# Patient Record
Sex: Female | Born: 1935 | Race: Black or African American | Hispanic: No | Smoking: Former smoker
Health system: Southern US, Community
[De-identification: ages and names within clinical notes are randomized; demographics above are authoritative.]

## PROBLEM LIST (undated history)

## (undated) DIAGNOSIS — J45909 Unspecified asthma, uncomplicated: Secondary | ICD-10-CM

## (undated) DIAGNOSIS — C801 Malignant (primary) neoplasm, unspecified: Secondary | ICD-10-CM

## (undated) DIAGNOSIS — I509 Heart failure, unspecified: Secondary | ICD-10-CM

## (undated) DIAGNOSIS — G459 Transient cerebral ischemic attack, unspecified: Secondary | ICD-10-CM

## (undated) DIAGNOSIS — M1990 Unspecified osteoarthritis, unspecified site: Secondary | ICD-10-CM

## (undated) DIAGNOSIS — I10 Essential (primary) hypertension: Secondary | ICD-10-CM

## (undated) HISTORY — PX: MASTECTOMY: SHX3

## (undated) HISTORY — PX: ABDOMINAL HYSTERECTOMY: SHX81

---

## 2005-12-06 ENCOUNTER — Ambulatory Visit (HOSPITAL_COMMUNITY): Admission: RE | Admit: 2005-12-06 | Discharge: 2005-12-06 | Payer: Self-pay | Admitting: *Deleted

## 2005-12-31 ENCOUNTER — Emergency Department (HOSPITAL_COMMUNITY): Admission: EM | Admit: 2005-12-31 | Discharge: 2005-12-31 | Payer: Self-pay | Admitting: Emergency Medicine

## 2006-01-25 ENCOUNTER — Emergency Department (HOSPITAL_COMMUNITY): Admission: EM | Admit: 2006-01-25 | Discharge: 2006-01-26 | Payer: Self-pay | Admitting: Emergency Medicine

## 2006-02-12 ENCOUNTER — Emergency Department (HOSPITAL_COMMUNITY): Admission: EM | Admit: 2006-02-12 | Discharge: 2006-02-12 | Payer: Self-pay | Admitting: Emergency Medicine

## 2006-02-14 ENCOUNTER — Inpatient Hospital Stay (HOSPITAL_COMMUNITY): Admission: AD | Admit: 2006-02-14 | Discharge: 2006-02-20 | Payer: Self-pay | Admitting: Psychiatry

## 2006-02-14 ENCOUNTER — Ambulatory Visit: Payer: Self-pay | Admitting: Psychiatry

## 2006-05-28 ENCOUNTER — Emergency Department (HOSPITAL_COMMUNITY): Admission: EM | Admit: 2006-05-28 | Discharge: 2006-05-28 | Payer: Self-pay | Admitting: Emergency Medicine

## 2006-12-09 ENCOUNTER — Ambulatory Visit (HOSPITAL_COMMUNITY): Admission: RE | Admit: 2006-12-09 | Discharge: 2006-12-09 | Payer: Self-pay | Admitting: *Deleted

## 2007-03-04 ENCOUNTER — Encounter: Admission: RE | Admit: 2007-03-04 | Discharge: 2007-05-07 | Payer: Self-pay | Admitting: Orthopaedic Surgery

## 2007-07-24 ENCOUNTER — Emergency Department (HOSPITAL_COMMUNITY): Admission: EM | Admit: 2007-07-24 | Discharge: 2007-07-24 | Payer: Self-pay | Admitting: Emergency Medicine

## 2007-12-02 ENCOUNTER — Ambulatory Visit: Payer: Self-pay | Admitting: Gastroenterology

## 2009-04-04 ENCOUNTER — Ambulatory Visit (HOSPITAL_COMMUNITY): Admission: RE | Admit: 2009-04-04 | Discharge: 2009-04-04 | Payer: Self-pay | Admitting: Internal Medicine

## 2009-08-21 ENCOUNTER — Emergency Department (HOSPITAL_COMMUNITY): Admission: EM | Admit: 2009-08-21 | Discharge: 2009-08-22 | Payer: Self-pay | Admitting: Emergency Medicine

## 2010-06-01 ENCOUNTER — Ambulatory Visit (HOSPITAL_COMMUNITY)
Admission: RE | Admit: 2010-06-01 | Discharge: 2010-06-01 | Payer: Self-pay | Source: Home / Self Care | Attending: Internal Medicine | Admitting: Internal Medicine

## 2010-07-09 ENCOUNTER — Encounter: Payer: Self-pay | Admitting: *Deleted

## 2010-09-10 LAB — BASIC METABOLIC PANEL
CO2: 33 mEq/L — ABNORMAL HIGH (ref 19–32)
Calcium: 9.3 mg/dL (ref 8.4–10.5)
GFR calc Af Amer: >60 mL/min (ref >60)
GFR calc non Af Amer: 53 mL/min — ABNORMAL LOW (ref >60)
Glucose, Bld: 108 mg/dL — ABNORMAL HIGH (ref 70–99)
Potassium: 2.8 mEq/L — ABNORMAL LOW (ref 3.5–5.1)
Sodium: 137 mEq/L (ref 135–145)

## 2010-09-10 LAB — URINALYSIS, ROUTINE W REFLEX MICROSCOPIC
Bilirubin Urine: NEGATIVE
Hgb urine dipstick: NEGATIVE
Nitrite: NEGATIVE
Specific Gravity, Urine: 1.006 (ref 1.005–1.030)
pH: 6.5 (ref 5.0–8.0)

## 2010-09-10 LAB — CBC
HCT: 38.2 % (ref 36.0–46.0)
Hemoglobin: 12.8 g/dL (ref 12.0–15.0)
RBC: 4.55 MIL/uL (ref 3.87–5.11)
RDW: 15.1 % (ref 11.5–15.5)

## 2010-09-10 LAB — POCT I-STAT, CHEM 8
BUN: 19 mg/dL (ref 6–23)
Calcium, Ion: 0.85 mmol/L — ABNORMAL LOW (ref 1.12–1.32)
Chloride: 104 mEq/L (ref 96–112)
HCT: 39 % (ref 36.0–46.0)
Potassium: 4 mEq/L (ref 3.5–5.1)
Sodium: 136 mEq/L (ref 135–145)

## 2010-09-10 LAB — DIFFERENTIAL
Basophils Absolute: 0 10*3/uL (ref 0.0–0.1)
Eosinophils Relative: 1 % (ref 0–5)
Lymphocytes Relative: 30 % (ref 12–46)
Lymphs Abs: 3 10*3/uL (ref 0.7–4.0)
Monocytes Absolute: 1 10*3/uL (ref 0.1–1.0)
Monocytes Relative: 10 % (ref 3–12)
Neutro Abs: 5.9 10*3/uL (ref 1.7–7.7)

## 2010-09-10 LAB — POCT CARDIAC MARKERS
CKMB, poc: 3.3 ng/mL (ref 1.0–8.0)
Troponin i, poc: <0.05 ng/mL (ref 0.00–0.09)

## 2010-09-10 LAB — URINE CULTURE

## 2010-09-25 ENCOUNTER — Emergency Department (HOSPITAL_COMMUNITY): Payer: Medicare Other

## 2010-09-25 ENCOUNTER — Observation Stay (HOSPITAL_COMMUNITY)
Admission: EM | Admit: 2010-09-25 | Discharge: 2010-09-27 | Disposition: A | Discharge status: Home / Self Care | Payer: Medicare Other | Attending: Internal Medicine | Admitting: Internal Medicine

## 2010-09-25 DIAGNOSIS — E119 Type 2 diabetes mellitus without complications: Secondary | ICD-10-CM | POA: Insufficient documentation

## 2010-09-25 DIAGNOSIS — E876 Hypokalemia: Secondary | ICD-10-CM | POA: Insufficient documentation

## 2010-09-25 DIAGNOSIS — Z7902 Long term (current) use of antithrombotics/antiplatelets: Secondary | ICD-10-CM | POA: Insufficient documentation

## 2010-09-25 DIAGNOSIS — E785 Hyperlipidemia, unspecified: Secondary | ICD-10-CM | POA: Insufficient documentation

## 2010-09-25 DIAGNOSIS — F319 Bipolar disorder, unspecified: Secondary | ICD-10-CM | POA: Insufficient documentation

## 2010-09-25 DIAGNOSIS — Z79899 Other long term (current) drug therapy: Secondary | ICD-10-CM | POA: Insufficient documentation

## 2010-09-25 DIAGNOSIS — G43909 Migraine, unspecified, not intractable, without status migrainosus: Secondary | ICD-10-CM | POA: Insufficient documentation

## 2010-09-25 DIAGNOSIS — I10 Essential (primary) hypertension: Secondary | ICD-10-CM | POA: Insufficient documentation

## 2010-09-25 DIAGNOSIS — G459 Transient cerebral ischemic attack, unspecified: Principal | ICD-10-CM | POA: Insufficient documentation

## 2010-09-25 DIAGNOSIS — J45909 Unspecified asthma, uncomplicated: Secondary | ICD-10-CM | POA: Insufficient documentation

## 2010-09-25 LAB — CBC
HCT: 38.2 % (ref 36.0–46.0)
MCH: 27.4 pg (ref 26.0–34.0)
MCHC: 33 g/dL (ref 30.0–36.0)
RDW: 15.1 % (ref 11.5–15.5)

## 2010-09-25 LAB — ETHANOL: Alcohol, Ethyl (B): <5 mg/dL (ref 0–10)

## 2010-09-25 LAB — PROTIME-INR
INR: 0.99 (ref 0.00–1.49)
Prothrombin Time: 13.3 seconds (ref 11.6–15.2)

## 2010-09-25 LAB — COMPREHENSIVE METABOLIC PANEL
Alkaline Phosphatase: 62 U/L (ref 39–117)
BUN: 12 mg/dL (ref 6–23)
CO2: 31 mEq/L (ref 19–32)
GFR calc non Af Amer: 56 mL/min — ABNORMAL LOW (ref >60)
Glucose, Bld: 141 mg/dL — ABNORMAL HIGH (ref 70–99)
Potassium: 2.9 mEq/L — ABNORMAL LOW (ref 3.5–5.1)
Total Bilirubin: 0.5 mg/dL (ref 0.3–1.2)
Total Protein: 7.9 g/dL (ref 6.0–8.3)

## 2010-09-25 LAB — RAPID URINE DRUG SCREEN, HOSP PERFORMED
Barbiturates: NOT DETECTED
Benzodiazepines: NOT DETECTED

## 2010-09-25 LAB — DIFFERENTIAL
Basophils Absolute: 0.1 10*3/uL (ref 0.0–0.1)
Basophils Relative: 1 % (ref 0–1)
Eosinophils Relative: 2 % (ref 0–5)
Lymphocytes Relative: 28 % (ref 12–46)
Monocytes Absolute: 1.2 10*3/uL — ABNORMAL HIGH (ref 0.1–1.0)

## 2010-09-25 LAB — GLUCOSE, CAPILLARY: Glucose-Capillary: 132 mg/dL — ABNORMAL HIGH (ref 70–99)

## 2010-09-25 LAB — TROPONIN I: Troponin I: 0.01 ng/mL (ref 0.00–0.06)

## 2010-09-25 LAB — MAGNESIUM: Magnesium: 2.3 mg/dL (ref 1.5–2.5)

## 2010-09-25 LAB — APTT: aPTT: 34 seconds (ref 24–37)

## 2010-09-25 LAB — CK TOTAL AND CKMB (NOT AT ARMC): CK, MB: 3 ng/mL (ref 0.3–4.0)

## 2010-09-26 ENCOUNTER — Observation Stay (HOSPITAL_COMMUNITY): Payer: Medicare Other

## 2010-09-26 DIAGNOSIS — R209 Unspecified disturbances of skin sensation: Secondary | ICD-10-CM

## 2010-09-26 LAB — BASIC METABOLIC PANEL
CO2: 31 mEq/L (ref 19–32)
Chloride: 102 mEq/L (ref 96–112)
GFR calc Af Amer: >60 mL/min (ref >60)
Potassium: 2.8 mEq/L — ABNORMAL LOW (ref 3.5–5.1)
Sodium: 139 mEq/L (ref 135–145)

## 2010-09-26 LAB — CARDIAC PANEL(CRET KIN+CKTOT+MB+TROPI)
CK, MB: 4.1 ng/mL — ABNORMAL HIGH (ref 0.3–4.0)
Total CK: 379 U/L — ABNORMAL HIGH (ref 7–177)
Troponin I: 0.02 ng/mL (ref 0.00–0.06)

## 2010-09-26 LAB — URINALYSIS, ROUTINE W REFLEX MICROSCOPIC
Bilirubin Urine: NEGATIVE
Hgb urine dipstick: NEGATIVE
Nitrite: NEGATIVE
Specific Gravity, Urine: 1.008 (ref 1.005–1.030)
Urobilinogen, UA: 0.2 mg/dL (ref 0.0–1.0)
pH: 6.5 (ref 5.0–8.0)

## 2010-09-26 LAB — GLUCOSE, CAPILLARY
Glucose-Capillary: 84 mg/dL (ref 70–99)
Glucose-Capillary: 86 mg/dL (ref 70–99)

## 2010-09-26 LAB — CBC
Hemoglobin: 12.4 g/dL (ref 12.0–15.0)
Platelets: 251 10*3/uL (ref 150–400)
RBC: 4.47 MIL/uL (ref 3.87–5.11)
WBC: 12.5 10*3/uL — ABNORMAL HIGH (ref 4.0–10.5)

## 2010-09-26 LAB — LIPID PANEL
Cholesterol: 183 mg/dL (ref 0–200)
LDL Cholesterol: 107 mg/dL — ABNORMAL HIGH (ref 0–99)

## 2010-09-26 LAB — MAGNESIUM: Magnesium: 2 mg/dL (ref 1.5–2.5)

## 2010-09-26 LAB — HEMOGLOBIN A1C: Mean Plasma Glucose: 134 mg/dL — ABNORMAL HIGH (ref <117)

## 2010-09-26 LAB — TSH: TSH: 1.891 u[IU]/mL (ref 0.350–4.500)

## 2010-09-27 LAB — BASIC METABOLIC PANEL
Calcium: 8.8 mg/dL (ref 8.4–10.5)
GFR calc Af Amer: >60 mL/min (ref >60)
GFR calc non Af Amer: 55 mL/min — ABNORMAL LOW (ref >60)
Glucose, Bld: 69 mg/dL — ABNORMAL LOW (ref 70–99)
Potassium: 3.4 mEq/L — ABNORMAL LOW (ref 3.5–5.1)
Sodium: 139 mEq/L (ref 135–145)

## 2010-09-27 LAB — GLUCOSE, CAPILLARY
Glucose-Capillary: 105 mg/dL — ABNORMAL HIGH (ref 70–99)
Glucose-Capillary: 106 mg/dL — ABNORMAL HIGH (ref 70–99)

## 2010-10-02 NOTE — Progress Notes (Signed)
  NAME:  Kerri Hernandez, COLLISTER NO.:  [id number]  MEDICAL RECORD NO.:  [id number]           PATIENT TYPE:  O  LOCATION:  3023                         FACILITY:  [facility]  PHYSICIAN:  Lonia Blood, M.D.       DATE OF BIRTH:  [birth year]                                PROGRESS NOTE   REASON FOR VISIT: Follow up on admission for weakness, headache and one episode of fever.  HISTORY OF PRESENT ILLNESS: Kerri Hernandez had a good night.  She had no recurrence of her numbness or headache.  She though feels very weak.  PHYSICAL EXAMINATION: VITAL SIGNS:  Temperature T max 98.3, heart rate 67, respiratory rate 19, blood pressure 127/77, saturating 96% on room air. GENERAL:  The patient is alert, oriented in no acute distress. CHEST:  Clear. HEART:  Regular without murmurs, rubs or gallops. ABDOMEN:  Soft, nontender.  Bowel sounds are present. LOWER EXTREMITIES:  Without edema.  LABORATORY DATA: For today available this morning CK 379, troponin I 0.02.  Sodium 139, potassium 2.8, chloride 102, bicarbonate 31, BUN 8, creatinine 1, glucose 222.  White blood cell count 12.5, hemoglobin 12.4, platelet count is 251.  ASSESSMENT/PLAN: 1. Episode of numbness, weakness, headache.  The patient was seen     yesterday by the admitting physician, Dr. Onalee Hua and a TIA workup     has been initiated.  MRI of the brain, transjugular cardiogram and     carotid Dopplers are pending.  A neurological evaluation does not     reveal any focal deficits. 2. Hyperglycemia.  I suspect this patient has new-onset diabetes.  I     am going to check a hemoglobin A1c and referral for education and     treatment.  I am going to start Amaryl at 1 mg daily. 3. Significant hypokalemia.  I am going to discontinue the     hydrochlorothiazide, replace the potassium and start the patient on     triamterene/hydrochlorothiazide in the future. 4. Schizoaffective disorder.  We will continue Zyprexa without      changes. 5. The patient says she feels weak and very anxious.  She does not     feel ready to return home and live all by herself.  We are going to     go ahead and obtain Physical Therapy and Occupational Therapy     consultation and we will proceed depending on the recommendations     of their evaluations.     Lonia Blood, M.D.    SL/MEDQ  D:  09/26/2010  T:  09/26/2010  Job:  [id number]  Electronically Signed by Lonia Blood M.D. on 10/01/2010 09:57:18 AM

## 2010-10-03 NOTE — Discharge Summary (Signed)
NAME:  Kerri Hernandez, Kerri Hernandez             ACCOUNT NO.:  [id number]  MEDICAL RECORD NO.:  [id number]           PATIENT TYPE:  O  LOCATION:  3023                         FACILITY:  [facility]  PHYSICIAN:  Jeoffrey Massed, MD    DATE OF BIRTH:  February 24, 1936  DATE OF ADMISSION:  09/25/2010 DATE OF DISCHARGE:                        DISCHARGE SUMMARY - REFERRING   PRIMARY CARE PRACTITIONER:  Dr. Loman Brooklyn of [facility] family.  PRIMARY DISCHARGE DIAGNOSES: 1. Transient ischemic attack versus atypical migraine. 2. Hypokalemia, probably secondary to diuretics. 3. New onset diabetes.  SECONDARY DISCHARGE DIAGNOSES: 1. History of hypokalemia, on diuretic therapy. 2. History of hypertension. 3. History of bipolar disorder. 4. History of bronchial asthma.  DISCHARGE MEDICATIONS: 1. Aspirin 325 mg 1 tablet p.o. daily. 2. Cozaar 50 mg tablet 100 mg p.o. daily. 3. Metformin 500 mg 1 tablet twice daily. 4. Potassium chloride 20 mEq 2 tablets p.o. twice daily. 5. Maxzide 37.5/25 mg tablet one tablet p.o. daily. 6. Amlodipine 10 mg 1 tablet daily. 7. Multivitamin 1 tablet p.o. daily. 8. Ranitidine 150 mg 1 tablet p.o. twice daily. 9. Simvastatin 10 mg 1 tablet daily. 10.Vitamin C 1 tablet p.o. daily. 11.Vitamin E 1 capsule p.o. daily. 12.Zafirlukast 20 mg 1 tablet p.o. twice daily. 13.Zyprexa 15 mg 1 tablet p.o. at bedtime.  CONSULTATIONS:  [facility].  BRIEF HISTORY OF PRESENT ILLNESS:  The patient is a 75 year old female with a history of bipolar disorder, hypertension, hypokalemia presented to the ED on September 25, 2010, with headache and left-sided numbness.  She was then admitted to the Hospitalist Service for further evaluation and treatment.  For further details, please see the history and physical that was dictated by Dr. Onalee Hua on admission.  PERTINENT RADIOLOGICAL STUDIES: 1. CT of the head without contrast showed no acute abnormality.  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 2. MRI of the brain showed mild chronic microvascular ischemia with no     acute infarct. 3. MRA of the brain showed mild stenosis of the right PICA origin and     left MCA bifurcation. 4. Bilateral carotid artery Doppler showed no evidence of significant     ICA stenosis.  Please note that this is a preliminary finding and     the official result is still pending. 5. A 2-D echocardiogram done on September 26, 2010, showed a normal LV     systolic function with EF around 55-60%.  No cardiac source of     emboli was identified.  BRIEF HOSPITAL COURSE: 1. Headache and left-sided facial numbness.  This was thought to be     either a TIA versus atypical migraine.  In due course during her     hospital stay, her headache resolved and also her numbness on the     face which is predominantly on the left side, all have resolved.     Given the symptomatology, Neurology was consulted.  The patient did     undergo a TIA workup as noted above and essentially all of this is  negative.  She will need continued risk factor modification when     she follows up with her primary care practitioner. 2. Diabetes.  Apparently the patient does know that she may have early     diabetes and has been controlling with diet and exercise.  Given     her symptomatology, she has now been started on metformin of     progressive risk factor modification. 3. Hypokalemia.  Apparently this patient has a longstanding history of     hypokalemia and apparently is taking 10 mEq of potassium chloride     on a b.i.d. basis.  Given that weakness was also generalized and     thought probably secondary to hypokalemia, her potassium dose has     now been increased to 14 mEq twice daily.  Her antihypertensive     medications have now been changed to Maxzide.  She will continue     taking losartan and amlodipine as well.  She will need close      monitoring of her electrolytes.  If the hypokalemia persists, then     possibly stopping the hydrochlorothiazide and assessing for other     causes of hypokalemia will be warranted.  However, for now, we will     defer all of this to her primary care practitioner. 4. Dyslipidemia.  The patient will continue on simvastatin. 5. Bipolar disorder.  This is stable.  She is to continue on     olanzapine.  DISPOSITION:  The patient will be discharged home.  FOLLOWUP INSTRUCTIONS: 1. The patient has been instructed to call and make an appointment     with her primary care practitioner within 1-2 weeks of discharge.     She claims understanding. 2. On follow up with her primary care practitioner, close monitoring     of her potassium is needed given her history of longstanding     hypokalemia.  It is also suggested that if her potassium continues     to be still low, then possibly discontinuing her     hydrochlorothiazide and working up for other causes of hypokalemia     will need to be done.  As above, we will defer this to her primary     care practitioner.  Total time spent coordinating discharge 45 minutes.     Jeoffrey Massed, MD     SG/MEDQ  D:  09/27/2010  T:  09/27/2010  Job:  [id number]  cc:   Dr. Loman Brooklyn, [facility]  Electronically Signed by Jeoffrey Massed  on 10/03/2010 08:06:19 PM

## 2010-10-09 NOTE — H&P (Signed)
NAME:  Kerri Hernandez, Kerri Hernandez NO.:  [id number]  MEDICAL RECORD NO.:  [id number]           PATIENT TYPE:  E  LOCATION:  [facility]                         FACILITY:  [facility]  PHYSICIAN:  Kerri Stable, MD   DATE OF BIRTH:  Feb 13, 1936  DATE OF ADMISSION:  09/25/2010 DATE OF DISCHARGE:                             HISTORY & PHYSICAL   PRIMARY CARE PHYSICIAN:  Dr. Loman Hernandez.  PSYCHIATRIST:  Dr. Lelon Hernandez.  CHIEF COMPLAINT:  Headache and left facial numbness.  HISTORY OF PRESENT ILLNESS:  Kerri Hernandez is a 75 year old woman with past medical history significant for bipolar disorder with psychosis versus schizoaffective disorder, hypertension, asthma who presents with chief complaint mentioned above.  She reports that approximately somewhere between 5:00 p.m. and 5:15 p.m. this afternoon, she got a sudden headache that was described as sharp, severe in nature, located on the left side of the head and radiating to the top.  She reports that this lasted approximately a total of 10 minutes.  Associated with this, she had some dizziness and other issues in the past along with some generalized weakness.  She also reports some left-sided facial numbness that has persisted since then and actually states is mildly worsened. Her headache resolved on its own and now reports only some very mild throbbing type discomfort located on the left side of the head.  Upon presentation, code stroke was called but case was reviewed by the rapid response team and discussed with Dr. Vickey Hernandez who asked for internal medicine to admit.  PAST MEDICAL HISTORY: 1. Bipolar disorder with depressive and psychotic features versus     schizoaffective disorder. 2. History of hypokalemia. 3. Hypertension. 4. Asthma. 5. Hyperlipidemia.  MEDICATIONS: 1. Ranitidine 150 mg p.o. b.i.d. 2. KCl 10 mEq p.o. b.i.d. 3. Aspirin 81 mg p.o. daily. 4. Singulair 20 mg p.o. daily. 5. Amlodipine 10 mg p.o. daily. 6.  Losartan/HCTZ 100/25 mg p.o. daily. 7. Zocor 10 mg p.o. daily. 8. Olanzapine 15 mg p.o. at bedtime.  ALLERGIES: 1. PENICILLIN. 2. SULFA.  SOCIAL HISTORY:  The patient lives alone.  She had a son with daughter- in-law and grandson that live in this city.  However, she states she has a daughter that lives in [place], [place] and she is the one to be contacted if need for medical decision making, the patient's daughter's name is Kerri Hernandez and her phone number is [phone].  The patient reports smoking for approximately 1 year in her use and otherwise not significant.  She denies any alcohol or drug use.  FAMILY HISTORY:  Noncontributory.  REVIEW OF SYSTEMS:  As per HPI.  All others reviewed and negative.  PHYSICAL EXAMINATION:  VITAL SIGNS:  Temperature 98.6, blood pressure 159/79, heart rate is 69, respirations 23, oxygen saturation 99% on room air. GENERAL:  This is an elderly woman lying in bed in no acute distress who is very pleasant. HEENT:  Head is normocephalic, atraumatic.  Pupils equally round and reactive to light and accommodation.  Extraocular movements are intact. Sclerae are  anicteric.  Mucous membranes were moist.  There is poor dentition.  There are no oral pharyngeal lesions.  Tongue and uvula are both midline.  There is no facial asymmetry. NECK:  Supple.  No thyromegaly.  No carotid bruits. LUNGS:  Clear to auscultation bilaterally with good air movement. HEART:  Normal S1, S2 with a regular rate and rhythm.  No murmurs, gallops, or rubs. ABDOMEN:  Obese and appears mildly distended, bowel sounds are present and normal.  Soft to palpation.  There is no tenderness. EXTREMITIES:  There is no cyanosis, clubbing, or edema. NEURO:  The patient is awake, alert and oriented x3.  Cranial nerves II- XII are intact except for some subjective decreased sensation in the left face.  Motor is intact.  Sensation is intact aside from that mentioned above.  LABORATORY  DATA:  WBC 12.4, hemoglobin 12.6, platelets 261,000.  PT 13.3, INR 0.99, PTT 34.  Sodium 137, potassium 2.9, chloride 97, bicarb 31, glucose 141, BUN 12, creatinine 0.97.  LFTs are within normal limits.  CK 300, CK-MB 3.0, troponin I 0.01.  IMAGING: 1. CT head without contrast.  Impression:  No acute intracranial     abnormalities. 2. Old right thalamic infarct and Hernandez mild small vessel white     matter ischemic changes in both cerebral hemispheres. 3. Electrocardiogram.  Impression:  Sinus rhythm with PVC, otherwise     possible U waves, no acute ischemic changes.  ASSESSMENT/PLAN: 1. Headache with left-sided facial numbness.  Currently unclear     etiology but transient symptoms suggest a possible transient     ischemic attack.  The patient's headache has since resolved and is     only mild making subarachnoid bleed much, much less likely.     Currently workup is unrevealing including CT of the head along with     the laboratory data except for mildly elevated leukocytosis and     total CK.  The patient currently denies any other symptoms to     suggest an infectious etiology including the fact that she is     afebrile.  Given her markedly improved state, we will go ahead and     admit to telemetry and do a TIA workup with an MRI, carotid     Doppler's, and 2-D echocardiogram.  We will also do a bedside     swallow eval.  We will get hemoglobin A1c as well as a fasting     lipid panel to risk stratify.  We will continue the patient's home     dose of aspirin along with a statin.  If the patient were to get     repeat or marked worsening of her headache, consider a repeat CT     scan versus a lumbar puncture. 2. Hypokalemia.  The patient has a history of hypokalemia and is     currently on HCTZ which is likely the culprit.  She is on potassium     supplementation though this is pretty low dose.  We will go ahead     and replete with 40 mEq p.o. b.i.d.  Can consider stopping the  HCTZ     and replacing with another antihypertensive. 3. Leukocytosis.  As mentioned unclear etiology right now.  May be     secondary to some demargination.  We will repeat in the morning     given the fact that she has no current symptoms aside from that     mentioned in problem #1.  We will also check UA.  If the patient     were to become febrile or  experience a change in symptoms, would     need further workup at that time for her leukocytosis. 4. Bipolar disorder.  As mentioned above, the patient appears Hernandez     on her home dose of olanzapine.  We will continue. 5. Hypertension.  We will continue with her home dose medications.     Consider change in HCTZ.  Hypokalemia continues to be a problem.  CODE STATUS:  The patient is a full code.  In case of need for medical decision making, she states that her daughter, Brie Eppard, who lives in [place], [place] is the one to be contacted.  Her cell phone number is [phone], home phone is [phone].     Kerri Stable, MD     MA/MEDQ  D:  09/25/2010  T:  09/25/2010  Job:  [id number]  cc:   Dr. Loman Hernandez Dr. Lelon Hernandez  Electronically Signed by Kerri Stable MD on 10/09/2010 10:29:43 AM

## 2010-10-24 NOTE — Consult Note (Signed)
NAME:  Kerri Hernandez, Kerri Hernandez NO.:  [id number]  MEDICAL RECORD NO.:  [id number]           PATIENT TYPE:  O  LOCATION:  3023                         FACILITY:  [facility]  PHYSICIAN:  Melvyn Novas, M.D.  DATE OF BIRTH:  Sep 02, 1935  DATE OF CONSULTATION:  09/26/2010 DATE OF DISCHARGE:                                CONSULTATION   This patient is a 75 year old African American right-handed female that presented on September 25, 2010, to the [facility] ED with a concern of 6 days' of headache and a new onset left facial numbness as reported by the ED physician.  The patient also had noted a left-sided neck pain. Code Stroke had been called through EMS and was later cancelled at the [facility].  Her NIH stroke scale when evaluated by the rapid response nurse was 1.  The patient presented to the [facility] ED with facial numbness, but also generalized weakness and malaise and a 6-day headache.  She later added neck pain, shoulder pain, and a tension occipital pain.  PAST MEDICAL AND SURGICAL HISTORY:  The patient has no history of strokes to her knowledge.  She has bipolar disorder with schizo- affective disorder, hypertension, asthma, morbid obesity, hyperlipidemia, hypokalemia, and she is status post right-sided total mastectomy after breast cancer.  Stroke risk factors for this patient are obesity, hyperlipidemia, and hypertension.  She also was just recently diagnosed with diabetes mellitus.  She has not started a medication for this yet.  CURRENT MEDICATIONS:  Zantac, potassium chloride, aspirin, Singulair, amlodipine, losartan, hydrochlorothiazide, Zocor, and olanzapine.  The patient has allergies to PENICILLIN.  FAMILY HISTORY:  Morbid obesity, hypertension.  SOCIAL HISTORY:  The patient lives alone.  She has a son and a daughter. She has a daughter that lives in [place], [place].  Her son is living closer to her home here in [place].  Tobacco use, the  patient has within the years stopped smoking.  She has not been using alcohol.  She denies illegal drug use.  REVIEW OF SYSTEMS:  Numbness, tension neck pain, headache, generalized malaise, generalized weakness.  PHYSICAL EXAMINATION:  Current temperature 96 degrees Fahrenheit, blood pressure 148/66 mmHg, heart rate is 67 and regular, respiratory rate is 19.  Lungs are clear to auscultation.  O2 saturation 98%.  The patient has a body mass index exceeding 40 and is morbidly obese.  Neck circumference is over 16 inches.  Mallampati is grade 3.  The patient has soft abdomen.  Her cranial nerve examination shows pupils to be equal.  She has facial symmetry.  Full extraocular movements and tongue and uvula are midline.  Neck and shoulder shrug were equal left and right, but she complains of a left-sided tension pain radiating to the temple from the tip of the shoulder.  Left facial hypersensitivity to fine touch is reported.  The forehead is not involved, only the lower branches of the trigeminal nerve would cover this area.  She denies any vision changes.  Her grip is equal.  She has right-sided limitation of dorsiflexion and plantar flexion, which is clearly weaker than the left foot.  Nutritional status  again morbidly obese.  TEST RESULTS:  Sodium 139, the patient is hypokalemic at 2.8, glucose level is elevated at 222.  White blood cell count 12.5, H and H is 12.4/37.5, platelet count 251,000.  TSH is 1.89, cholesterol 83, LDL 107, HDL 63.  The patient had a CT scan, which was negative for an acute stroke as far a CT scan can indicate this.  MRI is pending at the time.  ASSESSMENT AND PLAN:  I doubt that this patient suffered truly a stroke. She may have had a transient ischemic attack, but most likely she was first hypokalemic.  I would recommend a muscle relaxant to be used at night to treat the neck tension pain.  I also want the patient definitely to continue with her potassium  supplementation 40 mEq b.i.d. If the MRI should show a small stroke, further workup should be done. At this time, she has undergone an echocardiogram two-dimensional of which the results are not yet available.  Stroke Service will follow the patient tomorrow for the conclusion of the workup.     Melvyn Novas, M.D.     CD/MEDQ  D:  09/26/2010  T:  09/27/2010  Job:  [id number]  Electronically Signed by Melvyn Novas M.D. on 10/24/2010 12:07:35 PM
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
## 2011-01-10 ENCOUNTER — Encounter: Payer: Self-pay | Admitting: Podiatry

## 2011-03-09 LAB — DIFFERENTIAL
Basophils Absolute: 0.1
Basophils Relative: 1
Eosinophils Absolute: 0.1
Eosinophils Relative: 1
Lymphocytes Relative: 36
Lymphs Abs: 3.5
Monocytes Absolute: 0.9
Monocytes Relative: 9
Neutro Abs: 5.2
Neutrophils Relative %: 54

## 2011-03-09 LAB — CBC
HCT: 38.3
Hemoglobin: 12.7
MCHC: 33.1
MCV: 84.2
Platelets: 240
RBC: 4.54
RDW: 14.3
WBC: 9.7

## 2011-03-09 LAB — BASIC METABOLIC PANEL
BUN: 9
CO2: 32
Calcium: 9.5
Chloride: 103
Creatinine, Ser: 0.96
GFR calc Af Amer: >60
GFR calc non Af Amer: 58 — ABNORMAL LOW
Glucose, Bld: 86
Potassium: 3.5
Sodium: 144

## 2011-03-09 LAB — POCT CARDIAC MARKERS
Myoglobin, poc: 69.2
Operator id: 294501

## 2011-03-09 LAB — D-DIMER, QUANTITATIVE: D-Dimer, Quant: <0.22

## 2013-01-18 DIAGNOSIS — I517 Cardiomegaly: Secondary | ICD-10-CM | POA: Diagnosis present

## 2013-03-04 DIAGNOSIS — E785 Hyperlipidemia, unspecified: Secondary | ICD-10-CM | POA: Insufficient documentation

## 2015-07-15 DIAGNOSIS — M159 Polyosteoarthritis, unspecified: Secondary | ICD-10-CM | POA: Diagnosis present

## 2016-07-18 DIAGNOSIS — R32 Unspecified urinary incontinence: Secondary | ICD-10-CM | POA: Diagnosis present

## 2016-10-09 DIAGNOSIS — S99911A Unspecified injury of right ankle, initial encounter: Secondary | ICD-10-CM | POA: Diagnosis not present

## 2016-11-21 DIAGNOSIS — J3089 Other allergic rhinitis: Secondary | ICD-10-CM | POA: Diagnosis present

## 2016-11-28 ENCOUNTER — Emergency Department (HOSPITAL_COMMUNITY): Payer: Medicare (Managed Care)

## 2016-11-28 ENCOUNTER — Encounter (HOSPITAL_COMMUNITY): Payer: Self-pay

## 2016-11-28 ENCOUNTER — Emergency Department (HOSPITAL_COMMUNITY)
Admission: EM | Admit: 2016-11-28 | Discharge: 2016-11-28 | Disposition: A | Discharge status: Home / Self Care | Payer: Medicare (Managed Care) | Attending: Emergency Medicine | Admitting: Emergency Medicine

## 2016-11-28 DIAGNOSIS — Z8673 Personal history of transient ischemic attack (TIA), and cerebral infarction without residual deficits: Secondary | ICD-10-CM | POA: Diagnosis not present

## 2016-11-28 DIAGNOSIS — R109 Unspecified abdominal pain: Secondary | ICD-10-CM

## 2016-11-28 DIAGNOSIS — D649 Anemia, unspecified: Secondary | ICD-10-CM

## 2016-11-28 DIAGNOSIS — J45909 Unspecified asthma, uncomplicated: Secondary | ICD-10-CM | POA: Insufficient documentation

## 2016-11-28 DIAGNOSIS — Z79899 Other long term (current) drug therapy: Secondary | ICD-10-CM | POA: Diagnosis not present

## 2016-11-28 DIAGNOSIS — E876 Hypokalemia: Secondary | ICD-10-CM

## 2016-11-28 DIAGNOSIS — J029 Acute pharyngitis, unspecified: Secondary | ICD-10-CM | POA: Insufficient documentation

## 2016-11-28 HISTORY — DX: Unspecified asthma, uncomplicated: J45.909

## 2016-11-28 HISTORY — DX: Malignant (primary) neoplasm, unspecified: C80.1

## 2016-11-28 LAB — CBC
HCT: 35.5 % — ABNORMAL LOW (ref 36.0–46.0)
HEMOGLOBIN: 11 g/dL — AB (ref 12.0–15.0)
MCH: 27.1 pg (ref 26.0–34.0)
MCHC: 31 g/dL (ref 30.0–36.0)
MCV: 87.4 fL (ref 78.0–100.0)
Platelets: 38 10*3/uL — ABNORMAL LOW (ref 150–400)
RBC: 4.06 MIL/uL (ref 3.87–5.11)
RDW: 15 % (ref 11.5–15.5)
WBC: 5.1 10*3/uL (ref 4.0–10.5)

## 2016-11-28 LAB — COMPREHENSIVE METABOLIC PANEL
ALT: 22 U/L (ref 14–54)
ANION GAP: 10 (ref 5–15)
AST: 29 U/L (ref 15–41)
Albumin: 3.8 g/dL (ref 3.5–5.0)
Alkaline Phosphatase: 70 U/L (ref 38–126)
BUN: 11 mg/dL (ref 6–20)
CHLORIDE: 100 mmol/L — AB (ref 101–111)
CO2: 28 mmol/L (ref 22–32)
Calcium: 8.8 mg/dL — ABNORMAL LOW (ref 8.9–10.3)
Creatinine, Ser: 0.89 mg/dL (ref 0.44–1.00)
GFR calc Af Amer: >60 mL/min (ref >60)
GFR calc non Af Amer: >60 mL/min (ref >60)
Glucose, Bld: 95 mg/dL (ref 65–99)
Potassium: 3 mmol/L — ABNORMAL LOW (ref 3.5–5.1)
SODIUM: 138 mmol/L (ref 135–145)
Total Bilirubin: 0.6 mg/dL (ref 0.3–1.2)
Total Protein: 7.7 g/dL (ref 6.5–8.1)

## 2016-11-28 LAB — DIFFERENTIAL
BASOS ABS: 0 10*3/uL (ref 0.0–0.1)
Basophils Relative: 0 %
Eosinophils Absolute: 0.1 10*3/uL (ref 0.0–0.7)
Eosinophils Relative: 1 %
LYMPHS ABS: 2.3 10*3/uL (ref 0.7–4.0)
LYMPHS PCT: 46 %
Monocytes Absolute: 0.4 10*3/uL (ref 0.1–1.0)
Monocytes Relative: 7 %
NEUTROS ABS: 2.3 10*3/uL (ref 1.7–7.7)
NEUTROS PCT: 45 %

## 2016-11-28 LAB — URINALYSIS, ROUTINE W REFLEX MICROSCOPIC
Bilirubin Urine: NEGATIVE
Glucose, UA: NEGATIVE mg/dL
HGB URINE DIPSTICK: NEGATIVE
Ketones, ur: NEGATIVE mg/dL
Leukocytes, UA: NEGATIVE
Nitrite: NEGATIVE
Protein, ur: NEGATIVE mg/dL
SPECIFIC GRAVITY, URINE: 1.012 (ref 1.005–1.030)
pH: 6 (ref 5.0–8.0)

## 2016-11-28 LAB — LIPASE, BLOOD: LIPASE: 28 U/L (ref 11–51)

## 2016-11-28 LAB — RAPID STREP SCREEN (MED CTR MEBANE ONLY): Streptococcus, Group A Screen (Direct): NEGATIVE

## 2016-11-28 MED ORDER — IOPAMIDOL (ISOVUE-300) INJECTION 61%
INTRAVENOUS | Status: AC
  Administered 2016-11-28: 75 mL
  Filled 2016-11-28: qty 75

## 2016-11-28 MED ORDER — POTASSIUM CHLORIDE CRYS ER 20 MEQ PO TBCR: 20.0000 meq | EXTENDED_RELEASE_TABLET | Freq: Two times a day (BID) | ORAL | 0 refills | Status: DC

## 2016-11-28 MED ORDER — DEXAMETHASONE SODIUM PHOSPHATE 10 MG/ML IJ SOLN
10.0000 mg | Freq: Once | INTRAMUSCULAR | Status: AC
  Administered 2016-11-28: 10 mg via INTRAVENOUS
  Filled 2016-11-28: qty 1

## 2016-11-28 MED ORDER — MORPHINE SULFATE (PF) 4 MG/ML IV SOLN
4.0000 mg | Freq: Once | INTRAVENOUS | Status: AC
  Administered 2016-11-28: 4 mg via INTRAVENOUS
  Filled 2016-11-28: qty 1

## 2016-11-28 MED ORDER — ONDANSETRON HCL 4 MG/2ML IJ SOLN
4.0000 mg | Freq: Once | INTRAMUSCULAR | Status: AC
  Administered 2016-11-28: 4 mg via INTRAVENOUS
  Filled 2016-11-28: qty 2

## 2016-11-28 MED ORDER — POTASSIUM CHLORIDE CRYS ER 20 MEQ PO TBCR
40.0000 meq | EXTENDED_RELEASE_TABLET | Freq: Once | ORAL | Status: AC
  Administered 2016-11-28: 40 meq via ORAL
  Filled 2016-11-28: qty 2
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
## 2016-11-28 NOTE — ED Provider Notes (Signed)
[facility] DEPT Provider Note   CSN: [id number] Arrival date & time: 11/28/16  0438     History   Chief Complaint Chief Complaint  Patient presents with  . Chills  . Abdominal Pain    HPI Kerri Hernandez is a 81 y.o. female.  The history is provided by the patient.  She noted onset about 24 hours ago of pain in the left side of her throat and pain with swallowing. She has felt a lump in her throat and has noted that her voice is slightly worse. She denies fever, chills, sweats. She is also complaining of some right-sided abdominal pain over about the same time frame. She denies fever, chills, sweats. She states that she has been mildly constipated. She denies any sick contacts.  Past Medical History:  Diagnosis Date  . Asthma   . Cancer ([place])   . Stroke [facility])     There are no active problems to display for this patient.   Past Surgical History:  Procedure Laterality Date  . ABDOMINAL HYSTERECTOMY    . MASTECTOMY      OB History    No data available       Home Medications    Prior to Admission medications   Medication Sig Start Date End Date Taking? Authorizing Provider  amLODipine (NORVASC) 10 MG tablet Take 10 mg by mouth.      [provider]  aspirin 81 MG tablet Take 81 mg by mouth.      [provider]  benztropine (COGENTIN) 1 MG tablet Take 1 mg by mouth.      [provider]  losartan (COZAAR) 100 MG tablet Take 100 mg by mouth daily.      [provider]  OLANZapine (ZYPREXA) 15 MG tablet Take 15 mg by mouth.      [provider]  potassium chloride (KLOR-CON) 10 MEQ CR tablet Take 10 mEq by mouth daily.      [provider]  ranitidine (ZANTAC) 150 MG tablet Take 150 mg by mouth.      [provider]  simvastatin (ZOCOR) 10 MG tablet Take 10 mg by mouth.      [provider]  zafirlukast (ACCOLATE) 20 MG tablet Take 20 mg by mouth.      [provider]    Family  History No family history on file.  Social History Social History  Substance Use Topics  . Smoking status: Never Smoker  . Smokeless tobacco: Not on file  . Alcohol use No     Allergies   Aspirin; Penicillins; and Sulfa antibiotics   Review of Systems Review of Systems  All other systems reviewed and are negative.    Physical Exam Updated Vital Signs BP (!) 180/79   Pulse (!) 49   Temp 97.8 F (36.6 C) (Oral)   SpO2 100%   Physical Exam  Nursing note and vitals reviewed.  81 year old female, resting comfortably and in no acute distress. Vital signs are significant for hypertension and bradycardia. Oxygen saturation is 100%, which is normal. Head is normocephalic and atraumatic. PERRLA, EOMI. Oropharynx is clear. Neck is nontender and supple without adenopathy or JVD. Mildly tender nodule or lymph node present to the left of the thyroid cartilage. This nodule is freely movable. There is no fluctuance. Back is nontender and there is no CVA tenderness. Lungs are clear without rales, wheezes, or rhonchi. Chest is nontender. Heart has regular rate and rhythm without murmur. Abdomen is  soft, flat, with right upper quadrant tenderness which is actually mostly subcostal. There is tenderness to palpation of the inferior margin of the liver, but the edges sharpened liver is not enlarged. There are no masses or hepatosplenomegaly and peristalsis is hypoactive. Extremities have no cyanosis or edema, full range of motion is present. Skin is warm and dry without rash. Neurologic: Mental status is normal, cranial nerves are intact, there are no motor or sensory deficits.  ED Treatments / Results  Labs (all labs ordered are listed, but only abnormal results are displayed) Labs Reviewed  COMPREHENSIVE METABOLIC PANEL - Abnormal; Notable for the following:       Result Value   Potassium 3.0 (*)    Chloride 100 (*)    Calcium 8.8 (*)    All other components within normal limits  CBC  - Abnormal; Notable for the following:    Hemoglobin 11.0 (*)    HCT 35.5 (*)    All other components within normal limits  RAPID STREP SCREEN (NOT AT [facility])  CULTURE, GROUP A STREP ([employer])  LIPASE, BLOOD  URINALYSIS, ROUTINE W REFLEX MICROSCOPIC  DIFFERENTIAL     Radiology Ct Soft Tissue Neck W Contrast  Result Date: 11/28/2016 CLINICAL DATA:  LEFT neck mass.  Abdominal pain. EXAM: CT NECK WITH CONTRAST TECHNIQUE: Multidetector CT imaging of the neck was performed using the standard protocol following the bolus administration of intravenous contrast. CONTRAST:  29mL ISOVUE-300 IOPAMIDOL (ISOVUE-300) INJECTION 61% COMPARISON:  None. FINDINGS: PHARYNX AND LARYNX: Normal.  Widely patent airway. SALIVARY GLANDS: Normal. THYROID: 2 RIGHT thyroid nodules measure to 14 mm, no indicated follow-up. LYMPH NODES: No lymphadenopathy by CT size criteria. VASCULAR: Mild calcific atherosclerosis aortic arch and, carotid bifurcations. LIMITED INTRACRANIAL: Normal. VISUALIZED ORBITS: Normal. MASTOIDS AND VISUALIZED PARANASAL SINUSES: Well-aerated. SKELETON: Nonacute. Severe C5-6 and C6-7 degenerative discs. Moderate C2-3 and C3-4 disc protrusions. Severe RIGHT C3-4 facet arthropathy. UPPER CHEST: Lung apices are clear. No superior mediastinal lymphadenopathy. OTHER: None. IMPRESSION: No acute process in the neck. Electronically Signed   By: Elon Alas M.D.   On: 11/28/2016 06:59    Procedures Procedures (including critical care time)  Medications Ordered in ED Medications  dexamethasone (DECADRON) injection 10 mg (not administered)  ondansetron (ZOFRAN) injection 4 mg (4 mg Intravenous Given 11/28/16 0536)  morphine 4 MG/ML injection 4 mg (4 mg Intravenous Given 11/28/16 0536)  iopamidol (ISOVUE-300) 61 % injection (75 mLs  Contrast Given 11/28/16 0634)  potassium chloride SA (K-DUR,KLOR-CON) CR tablet 40 mEq (40 mEq Oral Given 11/28/16 0659)     Initial Impression / Assessment and Plan / ED Course    I have reviewed the triage vital signs and the nursing notes.  Pertinent labs & imaging results that were available during my care of the patient were reviewed by me and considered in my medical decision making (see chart for details).  Throat pain and questionable mass. She'll be sent for CT scan. Rapid strep screen is sent as well. Abdominal pain which seems to correlate with the liver margin. Will get metabolic panel and lipase. She has no prior records and the [facility] system.  Screen is negative, urinalysis is clear. Metabolic panel shows hypokalemia, but normal liver enzymes. Mild anemia is present and unchanged from baseline. WBC is normal without shift. Complaint seem to be from viral pharyngitis. Clumping her neck is probably a lymph node. She's given a dose of dexamethasone and discharged with prescription for K Dur. Return precautions discussed.  Final Clinical  Impressions(s) / ED Diagnoses   Final diagnoses:  Sore throat  Abdominal pain, unspecified abdominal location  Hypokalemia  Normochromic normocytic anemia    New Prescriptions New Prescriptions   POTASSIUM CHLORIDE SA (K-DUR,KLOR-CON) 20 MEQ TABLET    Take 1 tablet (20 mEq total) by mouth 2 (two) times daily.     Delora Fuel, MD 46/21/94 [phone]

## 2016-11-28 NOTE — ED Notes (Signed)
Pt on phone attempted to find ride home

## 2016-11-28 NOTE — Discharge Instructions (Signed)
Return if symptoms are getting worse. °

## 2016-11-28 NOTE — ED Notes (Signed)
Placed pt on bed pan.

## 2016-11-28 NOTE — ED Notes (Signed)
Pt asking to speak with social work

## 2016-11-28 NOTE — ED Notes (Signed)
SW at bedside.

## 2016-11-28 NOTE — ED Triage Notes (Signed)
Patient here for generalized abdominal pain.  Pain started this morning and has not gone away.  Also stated that she breaks out in sweat randomly.   A&Ox4 at this time.

## 2016-11-30 DIAGNOSIS — I5032 Chronic diastolic (congestive) heart failure: Secondary | ICD-10-CM | POA: Diagnosis present

## 2016-11-30 DIAGNOSIS — D631 Anemia in chronic kidney disease: Secondary | ICD-10-CM | POA: Diagnosis present

## 2016-11-30 DIAGNOSIS — N183 Chronic kidney disease, stage 3 (moderate): Secondary | ICD-10-CM

## 2016-11-30 LAB — CULTURE, GROUP A STREP (THRC)

## 2016-12-27 ENCOUNTER — Emergency Department (HOSPITAL_COMMUNITY): Payer: Medicare (Managed Care)

## 2016-12-27 ENCOUNTER — Emergency Department (HOSPITAL_COMMUNITY)
Admission: EM | Admit: 2016-12-27 | Discharge: 2016-12-27 | Disposition: A | Discharge status: Home / Self Care | Payer: Medicare (Managed Care) | Attending: Emergency Medicine | Admitting: Emergency Medicine

## 2016-12-27 ENCOUNTER — Encounter (HOSPITAL_COMMUNITY): Payer: Self-pay | Admitting: Emergency Medicine

## 2016-12-27 DIAGNOSIS — Z853 Personal history of malignant neoplasm of breast: Secondary | ICD-10-CM | POA: Insufficient documentation

## 2016-12-27 DIAGNOSIS — B9789 Other viral agents as the cause of diseases classified elsewhere: Secondary | ICD-10-CM | POA: Diagnosis not present

## 2016-12-27 DIAGNOSIS — J029 Acute pharyngitis, unspecified: Secondary | ICD-10-CM | POA: Insufficient documentation

## 2016-12-27 DIAGNOSIS — Z8673 Personal history of transient ischemic attack (TIA), and cerebral infarction without residual deficits: Secondary | ICD-10-CM | POA: Diagnosis not present

## 2016-12-27 DIAGNOSIS — R590 Localized enlarged lymph nodes: Secondary | ICD-10-CM | POA: Insufficient documentation

## 2016-12-27 DIAGNOSIS — J028 Acute pharyngitis due to other specified organisms: Secondary | ICD-10-CM | POA: Diagnosis not present

## 2016-12-27 DIAGNOSIS — I10 Essential (primary) hypertension: Secondary | ICD-10-CM | POA: Diagnosis not present

## 2016-12-27 DIAGNOSIS — Z79899 Other long term (current) drug therapy: Secondary | ICD-10-CM | POA: Insufficient documentation

## 2016-12-27 HISTORY — DX: Essential (primary) hypertension: I10

## 2016-12-27 LAB — CBC WITH DIFFERENTIAL/PLATELET
BASOS ABS: 0 10*3/uL (ref 0.0–0.1)
BASOS PCT: 1 %
Eosinophils Absolute: 0.1 10*3/uL (ref 0.0–0.7)
Eosinophils Relative: 1 %
HEMATOCRIT: 32.8 % — AB (ref 36.0–46.0)
HEMOGLOBIN: 10.6 g/dL — AB (ref 12.0–15.0)
Lymphocytes Relative: 34 %
Lymphs Abs: 2.8 10*3/uL (ref 0.7–4.0)
MCH: 27.7 pg (ref 26.0–34.0)
MCHC: 32.3 g/dL (ref 30.0–36.0)
MCV: 85.9 fL (ref 78.0–100.0)
Monocytes Absolute: 0.8 10*3/uL (ref 0.1–1.0)
Monocytes Relative: 9 %
NEUTROS ABS: 4.6 10*3/uL (ref 1.7–7.7)
NEUTROS PCT: 55 %
Platelets: 246 10*3/uL (ref 150–400)
RBC: 3.82 MIL/uL — AB (ref 3.87–5.11)
RDW: 15.6 % — ABNORMAL HIGH (ref 11.5–15.5)
WBC: 8.3 10*3/uL (ref 4.0–10.5)

## 2016-12-27 LAB — BASIC METABOLIC PANEL
ANION GAP: 10 (ref 5–15)
BUN: 26 mg/dL — ABNORMAL HIGH (ref 6–20)
CALCIUM: 9.2 mg/dL (ref 8.9–10.3)
CHLORIDE: 103 mmol/L (ref 101–111)
CO2: 27 mmol/L (ref 22–32)
Creatinine, Ser: 1.25 mg/dL — ABNORMAL HIGH (ref 0.44–1.00)
GFR calc non Af Amer: 40 mL/min — ABNORMAL LOW (ref >60)
GFR, EST AFRICAN AMERICAN: 46 mL/min — AB (ref >60)
Glucose, Bld: 92 mg/dL (ref 65–99)
Potassium: 3.5 mmol/L (ref 3.5–5.1)
Sodium: 140 mmol/L (ref 135–145)

## 2016-12-27 LAB — I-STAT CG4 LACTIC ACID, ED: LACTIC ACID, VENOUS: 0.93 mmol/L (ref 0.5–1.9)

## 2016-12-27 LAB — PROTIME-INR
INR: 1.11
Prothrombin Time: 14.3 seconds (ref 11.4–15.2)

## 2016-12-27 MED ORDER — PREDNISONE 50 MG PO TABS: 50.0000 mg | ORAL_TABLET | Freq: Every day | ORAL | 0 refills | Status: AC

## 2016-12-27 MED ORDER — IOPAMIDOL (ISOVUE-300) INJECTION 61%
INTRAVENOUS | Status: AC
  Administered 2016-12-27: 75 mL via INTRAVENOUS
  Filled 2016-12-27: qty 75

## 2016-12-27 MED ORDER — FENTANYL CITRATE (PF) 100 MCG/2ML IJ SOLN
50.0000 ug | Freq: Once | INTRAMUSCULAR | Status: AC
  Administered 2016-12-27: 50 ug via INTRAVENOUS
  Filled 2016-12-27: qty 2

## 2016-12-27 MED ORDER — SODIUM CHLORIDE 0.9 % IV BOLUS (SEPSIS)
500.0000 mL | Freq: Once | INTRAVENOUS | Status: AC
  Administered 2016-12-27: 500 mL via INTRAVENOUS

## 2016-12-27 MED ORDER — ACETAMINOPHEN 325 MG PO TABS
650.0000 mg | ORAL_TABLET | Freq: Once | ORAL | Status: AC
  Administered 2016-12-27: 650 mg via ORAL
  Filled 2016-12-27: qty 2

## 2016-12-27 MED ORDER — DEXAMETHASONE SODIUM PHOSPHATE 10 MG/ML IJ SOLN
10.0000 mg | Freq: Once | INTRAMUSCULAR | Status: AC
  Administered 2016-12-27: 10 mg via INTRAVENOUS
  Filled 2016-12-27: qty 1

## 2016-12-27 NOTE — Discharge Instructions (Signed)
Please take your steroids to help with your sore throat. Please stay hydrated. Please follow-up with your primary care physician in the next several days for reevaluation and further management. If any symptoms change or worsen, please return to the nearest emergency department.

## 2016-12-27 NOTE — ED Triage Notes (Signed)
Per EMS pt complaint of worsening sore throat for a week; denies other complaint.

## 2016-12-27 NOTE — ED Notes (Signed)
Patient transported to CT 

## 2016-12-27 NOTE — ED Provider Notes (Signed)
[facility] DEPT Provider Note   CSN: [id number] Arrival date & time: 12/27/16  1504     History   Chief Complaint Chief Complaint  Patient presents with  . Sore Throat    HPI Kerri Hernandez is a 81 y.o. female.  The history is provided by the patient.  Sore Throat  This is a recurrent problem. The current episode started more than 2 days ago. The problem occurs constantly. The problem has been gradually worsening. Pertinent negatives include no chest pain, no abdominal pain, no headaches and no shortness of breath. Nothing aggravates the symptoms. Nothing relieves the symptoms. She has tried nothing for the symptoms. The treatment provided no relief.    Past Medical History:  Diagnosis Date  . Asthma   . Cancer ([place])   . Hypertension   . Stroke [facility])     There are no active problems to display for this patient.   Past Surgical History:  Procedure Laterality Date  . ABDOMINAL HYSTERECTOMY    . MASTECTOMY      OB History    No data available       Home Medications    Prior to Admission medications   Medication Sig Start Date End Date Taking? Authorizing Provider  amLODipine (NORVASC) 10 MG tablet Take 10 mg by mouth.      [provider]  aspirin 81 MG tablet Take 81 mg by mouth.      [provider]  benztropine (COGENTIN) 1 MG tablet Take 1 mg by mouth.      [provider]  losartan (COZAAR) 100 MG tablet Take 100 mg by mouth daily.      [provider]  OLANZapine (ZYPREXA) 15 MG tablet Take 15 mg by mouth.      [provider]  potassium chloride (KLOR-CON) 10 MEQ CR tablet Take 10 mEq by mouth daily.      [provider]  potassium chloride SA (K-DUR,KLOR-CON) 20 MEQ tablet Take 1 tablet (20 mEq total) by mouth 2 (two) times daily. 11/23/35   Delora Fuel, MD  ranitidine (ZANTAC) 150 MG tablet Take 150 mg by mouth.      [provider]  simvastatin (ZOCOR) 10 MG tablet Take 10 mg by mouth.       [provider]  zafirlukast (ACCOLATE) 20 MG tablet Take 20 mg by mouth.      [provider]    Family History No family history on file.  Social History Social History  Substance Use Topics  . Smoking status: Never Smoker  . Smokeless tobacco: Not on file  . Alcohol use No     Allergies   Aspirin; Penicillins; and Sulfa antibiotics   Review of Systems Review of Systems  Constitutional: Positive for chills and diaphoresis. Negative for activity change, fatigue and fever.  HENT: Positive for congestion, rhinorrhea, sore throat, trouble swallowing and voice change.   Eyes: Negative for visual disturbance.  Respiratory: Negative for cough, chest tightness, shortness of breath, wheezing and stridor.   Cardiovascular: Negative for chest pain, palpitations and leg swelling.  Gastrointestinal: Negative for abdominal distention, abdominal pain, constipation, diarrhea, nausea and vomiting.  Genitourinary: Negative for difficulty urinating, dysuria and frequency.  Musculoskeletal: Negative for back pain and neck pain.  Skin: Negative for rash and wound.  Neurological: Negative for dizziness, weakness, light-headedness, numbness and headaches.  Psychiatric/Behavioral: Negative for agitation and confusion.  All other systems reviewed and are negative.    Physical Exam Updated Vital  Signs BP (!) 176/89 (BP Location: Left Arm)   Pulse (!) 58   Temp 99.2 F (37.3 C) (Oral)   Resp 20   SpO2 100%   Physical Exam  Constitutional: She appears well-developed and well-nourished. No distress.  HENT:  Head: Normocephalic and atraumatic.  Right Ear: External ear normal.  Left Ear: External ear normal.  Nose: Nose normal.  Mouth/Throat: Oropharynx is clear and moist. No oropharyngeal exudate.  Eyes: Pupils are equal, round, and reactive to light. Conjunctivae and EOM are normal.  Neck: No spinous process tenderness and no muscular tenderness present. No neck  rigidity.    Cardiovascular: Normal rate.   No murmur heard. Pulmonary/Chest: Effort normal and breath sounds normal. No stridor. No respiratory distress. She has no wheezes. She exhibits no tenderness.  Abdominal: Soft. There is no tenderness.  Musculoskeletal: She exhibits no edema.  Lymphadenopathy:    She has cervical adenopathy.  Neurological: She is alert. No sensory deficit. She exhibits normal muscle tone.  Skin: Skin is warm and dry. Capillary refill takes less than 2 seconds. No rash noted. She is not diaphoretic. No erythema.  Psychiatric: She has a normal mood and affect.  Nursing note and vitals reviewed.    ED Treatments / Results  Labs (all labs ordered are listed, but only abnormal results are displayed) Labs Reviewed  CBC WITH DIFFERENTIAL/PLATELET - Abnormal; Notable for the following:       Result Value   RBC 3.82 (*)    Hemoglobin 10.6 (*)    HCT 32.8 (*)    RDW 15.6 (*)    All other components within normal limits  BASIC METABOLIC PANEL - Abnormal; Notable for the following:    BUN 26 (*)    Creatinine, Ser 1.25 (*)    GFR calc non Af Amer 40 (*)    GFR calc Af Amer 46 (*)    All other components within normal limits  RAPID STREP SCREEN (NOT AT [facility])  PROTIME-INR  I-STAT CG4 LACTIC ACID, ED    EKG  EKG Interpretation None       Radiology Dg Chest 2 View  Result Date: 12/27/2016 CLINICAL DATA:  Swollen lymph nodes and pain.  Infection. EXAM: CHEST  2 VIEW COMPARISON:  July 24, 2007 FINDINGS: Stable cardiomegaly. The hila and mediastinum are normal. No nodules or masses. No focal infiltrates. Probable tiny effusions based on the lateral view. IMPRESSION: Tiny effusions based on the lateral view. Cardiomegaly. No other change. Electronically Signed   By: Dorise Bullion III M.D   On: 12/27/2016 19:57   Ct Soft Tissue Neck W Contrast  Result Date: 12/27/2016 CLINICAL DATA:  81 y/o  F; 1 week of worsening sore throat. EXAM: CT NECK WITH CONTRAST  TECHNIQUE: Multidetector CT imaging of the neck was performed using the standard protocol following the bolus administration of intravenous contrast. CONTRAST:  75 cc Isovue-300 COMPARISON:  11/28/2016 CT of the neck FINDINGS: Pharynx and larynx: No exophytic mass or significant swelling. Salivary glands: No inflammation, mass, or stone. Thyroid: Thyroid nodules measuring up to 14 mm in the right lobe. Lymph nodes: None enlarged or abnormal density. Vascular: Negative. Limited intracranial: Negative. Visualized orbits: Negative. Mastoids and visualized paranasal sinuses: Clear. Skeleton: Cervical degenerative changes with grade 1 C3-4 anterolisthesis and moderate to severe C5 through C7 discogenic degenerative changes. Disc bulges from C2 through C5. No high-grade canal stenosis. Upper chest: Negative. Other: None. IMPRESSION: Stable CT of the neck. No acute process identified as explanation  for pain. Electronically Signed   By: Kristine Garbe M.D.   On: 12/27/2016 19:49    Procedures Procedures (including critical care time)  Medications Ordered in ED Medications  fentaNYL (SUBLIMAZE) injection 50 mcg (50 mcg Intravenous Given 12/27/16 1801)  dexamethasone (DECADRON) injection 10 mg (10 mg Intravenous Given 12/27/16 1801)  sodium chloride 0.9 % bolus 500 mL (0 mLs Intravenous Stopped 12/27/16 2130)  iopamidol (ISOVUE-300) 61 % injection (75 mLs Intravenous Contrast Given 12/27/16 1928)  acetaminophen (TYLENOL) tablet 650 mg (650 mg Oral Given 12/27/16 2135)     Initial Impression / Assessment and Plan / ED Course  I have reviewed the triage vital signs and the nursing notes.  Pertinent labs & imaging results that were available during my care of the patient were reviewed by me and considered in my medical decision making (see chart for details).     Kerri Hernandez is a 81 y.o. female with a past medical history significant for asthma, cancer, stroke, and hypertension who presents with  left neck pain, swelling, sore throat, and difficulty swallowing. Patient reports that she had a similar problem one year ago in [place] where she had a serious neck infection requiring IV antibiotics. She says that over the last week, her symptoms have returned and worsened. She says that her voice is changed and she is having difficulty swallowing solids. She can still swallow some soft foods and liquids. She says that the pain is a 10 out of 10 and aching on the left side of her neck. Her right neck does not hurt. She has difficulty twisting her neck. She reports chills but no fevers. She denies any shortness of breath, chest pain, or abdominal pain. She does report a dry cough, subjective chills, and sweats. She has not taken any medicine for her symptoms. She denies any other complaints aside from chronic constipation.  On exam, patient's uvula is midline. No significant posterior oropharyngeal erythema. Patient has tenderness this under her jaw on the left side. There is fullness on the left neck. Patient cannot twist her neck in all directions without pain. No posterior neck tenderness. No crepitance. Lungs are clear. Abdomen nontender. Extraocular movements intact.  Given patient's report of serious neck infection, patient will have CT scan of the neck to look for abscess or other abnormality. Patient will have screening laboratory testing, fluids, Decadron for the throat swelling sensation, and we given pain medicine.  Anticipate reassessment following workup.  Patient's outside testing from [facility] was not found in care everywhere.  9:23 PM Patient's diagnostic testing results in above. Lactic acid nonelevated. No leukocytosis. A similar hemoglobin to prior. Creatinine slightly elevated, this is likely secondary to possible dehydration. Chest x-ray shows no pneumonia. CT scan shows no deep space neck infection or other significant abnormalities.  Suspect viral pharyngitis given the  reassuring CT scan and the patient's rhinorrhea and congestion with her sore throat.  Patient felt much better after fluids and steroids. Patient will be given prescription for prednisone. Patient instructed to continue hydration and follow-up with her primary care physician in several days. Strict return precautions were given for any new or worsened symptoms. Patient and family had no other questions or concerns and patient was discharged in good condition.    Final Clinical Impressions(s) / ED Diagnoses   Final diagnoses:  Viral pharyngitis    New Prescriptions New Prescriptions   PREDNISONE (DELTASONE) 50 MG TABLET    Take 1 tablet (50 mg total) by mouth  daily.    Clinical Impression: 1. Viral pharyngitis     Disposition: Discharge  Condition: Good  I have discussed the results, Dx and Tx plan with the pt(& family if present). He/she/they expressed understanding and agree(s) with the plan. Discharge instructions discussed at great length. Strict return precautions discussed and pt &/or family have verbalized understanding of the instructions. No further questions at time of discharge.    New Prescriptions   PREDNISONE (DELTASONE) 50 MG TABLET    Take 1 tablet (50 mg total) by mouth daily.    Follow Up: Nicholes Rough, PA-C [place] [address] [phone]  Schedule an appointment as soon as possible for a visit    [facility] DEPT [place] [id number] [place] [phone]  If symptoms worsen     Tegeler, Gwenyth Allegra, MD 12/28/16 [phone]

## 2017-01-04 DIAGNOSIS — F0390 Unspecified dementia without behavioral disturbance: Secondary | ICD-10-CM | POA: Diagnosis present

## 2017-01-25 ENCOUNTER — Emergency Department (HOSPITAL_COMMUNITY)
Admission: EM | Admit: 2017-01-25 | Discharge: 2017-01-25 | Disposition: A | Discharge status: Home / Self Care | Payer: Medicare (Managed Care) | Attending: Emergency Medicine | Admitting: Emergency Medicine

## 2017-01-25 ENCOUNTER — Encounter (HOSPITAL_COMMUNITY): Payer: Self-pay

## 2017-01-25 ENCOUNTER — Emergency Department (HOSPITAL_COMMUNITY): Payer: Medicare (Managed Care)

## 2017-01-25 DIAGNOSIS — J45909 Unspecified asthma, uncomplicated: Secondary | ICD-10-CM | POA: Diagnosis not present

## 2017-01-25 DIAGNOSIS — R079 Chest pain, unspecified: Secondary | ICD-10-CM

## 2017-01-25 DIAGNOSIS — Z79899 Other long term (current) drug therapy: Secondary | ICD-10-CM | POA: Insufficient documentation

## 2017-01-25 DIAGNOSIS — I10 Essential (primary) hypertension: Secondary | ICD-10-CM | POA: Diagnosis not present

## 2017-01-25 LAB — I-STAT TROPONIN, ED
TROPONIN I, POC: 0.01 ng/mL (ref 0.00–0.08)
TROPONIN I, POC: 0.01 ng/mL (ref 0.00–0.08)

## 2017-01-25 LAB — BASIC METABOLIC PANEL
Anion gap: 9 (ref 5–15)
BUN: 21 mg/dL — AB (ref 6–20)
CALCIUM: 8.9 mg/dL (ref 8.9–10.3)
CHLORIDE: 101 mmol/L (ref 101–111)
CO2: 28 mmol/L (ref 22–32)
CREATININE: 1.25 mg/dL — AB (ref 0.44–1.00)
GFR calc non Af Amer: 40 mL/min — ABNORMAL LOW (ref >60)
GFR, EST AFRICAN AMERICAN: 46 mL/min — AB (ref >60)
Glucose, Bld: 105 mg/dL — ABNORMAL HIGH (ref 65–99)
Potassium: 3.6 mmol/L (ref 3.5–5.1)
SODIUM: 138 mmol/L (ref 135–145)

## 2017-01-25 LAB — CBC
HCT: 32.6 % — ABNORMAL LOW (ref 36.0–46.0)
Hemoglobin: 10.4 g/dL — ABNORMAL LOW (ref 12.0–15.0)
MCH: 27.7 pg (ref 26.0–34.0)
MCHC: 31.9 g/dL (ref 30.0–36.0)
MCV: 86.7 fL (ref 78.0–100.0)
PLATELETS: 359 10*3/uL (ref 150–400)
RBC: 3.76 MIL/uL — ABNORMAL LOW (ref 3.87–5.11)
RDW: 16.1 % — AB (ref 11.5–15.5)
WBC: 8.1 10*3/uL (ref 4.0–10.5)

## 2017-01-25 MED ORDER — ACETAMINOPHEN 500 MG PO TABS
1000.0000 mg | ORAL_TABLET | Freq: Once | ORAL | Status: AC
  Administered 2017-01-25: 1000 mg via ORAL
  Filled 2017-01-25: qty 2
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
## 2017-01-25 NOTE — Discharge Instructions (Signed)
Recommend follow up with primary care physician

## 2017-01-25 NOTE — ED Provider Notes (Signed)
[facility] DE[facility] Provider Note   CSN: [id number] Arrival date & time: 01/25/17  1512     History   Chief Complaint Chief Complaint  Patient presents with  . Chest Pain    HPI Kerri Hernandez is a 81 y.o. female.  81 yo F with a chief complaint of chest pain. Describes this as a sharp burning pain localized to the center of her chest. Also feels like a squeezing pain. Denies radiation. Nothing seems to make this better or worse. Started just prior to arrival. Was given some aspirin that she thinks improved her symptoms. Some very mild shortness of breath with this. Had some diaphoresis but denies nausea or vomiting. Denies lower extremity edema. Denies hemoptysis. Denies recent surgery or hospitalization. Remote history of breast cancer that resolved with mastectomy without chemotherapy or radiation.   The history is provided by the patient.  Chest Pain   This is a new problem. The current episode started less than 1 hour ago. The problem occurs constantly. The problem has not changed since onset.The pain is present in the substernal region. The pain is at a severity of 9/10. The pain is moderate. The quality of the pain is described as brief. The pain does not radiate. Duration of episode(s) is 45 minutes. Pertinent negatives include no dizziness, no fever, no headaches, no nausea, no palpitations, no shortness of breath and no vomiting. She has tried nothing for the symptoms. The treatment provided no relief. Risk factors include being elderly.  Her past medical history is significant for hyperlipidemia and hypertension.  Pertinent negatives for past medical history include no diabetes and no MI.  Her family medical history is significant for early MI.    Past Medical History:  Diagnosis Date  . Asthma   . Cancer ([place])   . Hypertension   . Stroke [facility])     There are no active problems to display for this patient.   Past Surgical History:  Procedure Laterality Date  .  ABDOMINAL HYSTERECTOMY    . MASTECTOMY      OB History    No data available       Home Medications    Prior to Admission medications   Medication Sig Start Date End Date Taking? Authorizing Provider  acetaminophen (TYLENOL) 325 MG tablet Take 650 mg by mouth daily.   Yes [provider]  ARIPiprazole (ABILIFY) 30 MG tablet Take 30 mg by mouth daily.   Yes [provider]  CALCIUM PO Take 1 tablet by mouth daily.   Yes [provider]  Cholecalciferol (VITAMIN D PO) Take 1 tablet by mouth daily.   Yes [provider]  ferrous sulfate 325 (65 FE) MG tablet Take 325 mg by mouth daily.   Yes [provider]  fluticasone (FLONASE) 50 MCG/ACT nasal spray Place 2 sprays into both nostrils daily.    Yes [provider]  furosemide (LASIX) 40 MG tablet Take 20 mg by mouth daily.   Yes [provider]  losartan (COZAAR) 100 MG tablet Take 100 mg by mouth daily.   Yes [provider]  Multiple Vitamin (MULTIVITAMIN WITH MINERALS) TABS tablet Take 1 tablet by mouth daily.   Yes [provider]  pantoprazole (PROTONIX) 40 MG tablet Take 40 mg by mouth daily.   Yes [provider]  venlafaxine XR (EFFEXOR-XR) 150 MG 24 hr capsule Take 150 mg by mouth daily with breakfast.   Yes [provider]  Vitamins C E (VITAMIN C &  E COMPLEX PO) Take 1 tablet by mouth daily.   Yes [provider]  potassium chloride SA (K-DUR,KLOR-CON) 20 MEQ tablet Take 1 tablet (20 mEq total) by mouth 2 (two) times daily. Patient taking differently: Take 20 mEq by mouth daily.  06/18/73   Delora Fuel, MD    Family History No family history on file.  Social History Social History  Substance Use Topics  . Smoking status: Never Smoker  . Smokeless tobacco: Not on file  . Alcohol use No     Allergies   Influenza vaccines; Aspirin; Penicillins; and Sulfa antibiotics   Review of Systems Review of Systems    Constitutional: Negative for chills and fever.  HENT: Negative for congestion and rhinorrhea.   Eyes: Negative for redness and visual disturbance.  Respiratory: Negative for shortness of breath and wheezing.   Cardiovascular: Negative for chest pain and palpitations.  Gastrointestinal: Negative for nausea and vomiting.  Genitourinary: Negative for dysuria and urgency.  Musculoskeletal: Negative for arthralgias and myalgias.  Skin: Negative for pallor and wound.  Neurological: Negative for dizziness and headaches.     Physical Exam Updated Vital Signs BP (!) 173/76   Pulse (!) 51   Temp 98.5 F (36.9 C) (Oral)   Resp 18   Ht 5\' 1"  (1.549 m)   Wt 58.1 kg (128 lb)   SpO2 99%   BMI 24.19 kg/m   Physical Exam  Constitutional: She is oriented to person, place, and time. She appears well-developed and well-nourished. No distress.  HENT:  Head: Normocephalic and atraumatic.  Eyes: Pupils are equal, round, and reactive to light. EOM are normal.  Neck: Normal range of motion. Neck supple.  Cardiovascular: Normal rate and regular rhythm.  Exam reveals no gallop and no friction rub.   No murmur heard. Pulmonary/Chest: Effort normal. She has no wheezes. She has no rales. She exhibits tenderness (reproduces pain \).  Abdominal: Soft. She exhibits no distension and no mass. There is no tenderness. There is no guarding.  Musculoskeletal: She exhibits no edema or tenderness.  Neurological: She is alert and oriented to person, place, and time.  Skin: Skin is warm and dry. She is not diaphoretic.  Psychiatric: She has a normal mood and affect. Her behavior is normal.  Nursing note and vitals reviewed.    ED Treatments / Results  Labs (all labs ordered are listed, but only abnormal results are displayed) Labs Reviewed  BASIC METABOLIC PANEL - Abnormal; Notable for the following:       Result Value   Glucose, Bld 105 (*)    BUN 21 (*)    Creatinine, Ser 1.25 (*)    GFR calc non Af  Amer 40 (*)    GFR calc Af Amer 46 (*)    All other components within normal limits  CBC - Abnormal; Notable for the following:    RBC 3.76 (*)    Hemoglobin 10.4 (*)    HCT 32.6 (*)    RDW 16.1 (*)    All other components within normal limits  I-STAT TROPONIN, ED  I-STAT TROPONIN, ED    EKG  EKG Interpretation  Date/Time:  Friday January 25 2017 15:16:08 EDT Ventricular Rate:  60 PR Interval:    QRS Duration: 90 QT Interval:  426 QTC Calculation: 426 R Axis:   40 Text Interpretation:  Sinus rhythm Nonspecific T abnormalities, lateral leads flipped t waves laterally seen on prior Otherwise no significant change Confirmed by Deno Etienne [phone]) on 01/25/2017  3:19:19 PM Also confirmed by Deno Etienne [id number]), editor Laurena Spies [phone])  on 01/25/2017 4:06:53 PM       Radiology Dg Chest 2 View  Result Date: 01/25/2017 CLINICAL DATA:  Chest pain.  Hypertension. EXAM: CHEST  2 VIEW COMPARISON:  December 27, 2016 FINDINGS: There is no edema or consolidation. There is cardiomegaly with pulmonary vascularity within normal limits. No adenopathy. No evident bone lesions. No pneumothorax. IMPRESSION: Cardiomegaly.  No edema or consolidation. Electronically Signed   By: Lowella Grip III M.D.   On: 01/25/2017 16:03    Procedures Procedures (including critical care time)  Medications Ordered in ED Medications  acetaminophen (TYLENOL) tablet 1,000 mg (1,000 mg Oral Given 01/25/17 1546)     Initial Impression / Assessment and Plan / ED Course  I have reviewed the triage vital signs and the nursing notes.  Pertinent labs & imaging results that were available during my care of the patient were reviewed by me and considered in my medical decision making (see chart for details).     81 yo F With a chief complaint of chest pain. This completely atypical for ACS reproduced with over the patient does have a multitude of risk factors. Will obtain a delta troponin.   Turned over to Dr.  Lacinda Axon, please see his note for further details.   The patients results and plan were reviewed and discussed.   Any x-rays performed were independently reviewed by myself.   Differential diagnosis were considered with the presenting HPI.  Medications  acetaminophen (TYLENOL) tablet 1,000 mg (1,000 mg Oral Given 01/25/17 1546)    Vitals:   01/25/17 1800 01/25/17 1830 01/25/17 1930 01/25/17 2000  BP: (!) 158/74 (!) 152/83 (!) 172/84 (!) 173/76  Pulse: (!) 56 (!) 55 (!) 54 (!) 51  Resp: 18 18 18 18   Temp:      TempSrc:      SpO2: 99% 98% 99% 99%  Weight:      Height:        Final diagnoses:  Chest pain, unspecified type       Final Clinical Impressions(s) / ED Diagnoses   Final diagnoses:  Chest pain, unspecified type    New Prescriptions Discharge Medication List as of 01/25/2017  8:09 PM       Deno Etienne, DO 01/26/17 1449

## 2017-01-25 NOTE — ED Notes (Signed)
Patient transported to X-ray 

## 2017-01-25 NOTE — ED Provider Notes (Signed)
Delta troponin negative2. This was discussed with the patient and her daughter-in-law. She'll be discharged and seek primary care follow-up.   Nat Christen, MD 01/25/17 2010

## 2017-01-25 NOTE — ED Notes (Signed)
ED Provider at bedside. 

## 2017-01-25 NOTE — ED Notes (Signed)
Pt called out, tearful stating that her son does not want her to come back to live with him and would like for her to go to a nursing home. Rn asked if that is what pt was wanting as well and she said yes that she would rather go to a nursing home. Upon arrival to ED pt was telling this RN that her son is not nice to her and that he always gets mad when she calls the ambulance to take her to the hospital. Dr Lacinda Axon aware and social work consult ordered

## 2017-01-25 NOTE — ED Notes (Signed)
Patient requested Education officer, museum.  Social work is Arts administrator from [place]," requested that he/SW call the patient in the room to discuss Ms. Jaros's home situation.  No social worker physically @ [facility].

## 2017-01-25 NOTE — Progress Notes (Signed)
CSW called pt's son who corraborated his mother's view he would like to place her in assisted living because she feels she can't be left alone.  CSW believes the pt's APS report wpould be screened out because pt is not physically harmed, or verbally abused, pt admits son states he would like for pt to movie into "a nursing home".  Pt is not disabled and reports her son does not take advantage of her physically or financially.  CSW provided pt's son with a AFL list and instructions on how to work towards placing his mother, which is his mother's (the pt's) wishes, into an ALF.    CSW emailed pt's son AFL list.  Pt's son was appreciative and thanked the CSW.  CSW updared pt's RN.  Please reconsult if future social work needs arise.  CSW signing off, as social work intervention is no longer needed.  Alphonse Guild. Fredi Hurtado, Reed Pandy, CSI Clinical Social Worker Ph: [phone]

## 2017-01-25 NOTE — ED Triage Notes (Signed)
Pt from home with ems c.o central non radiating chest "pressure" that started about an hour ago after eating lunch. Pt states she also feels dizzy. Pt denies n/v. Pain was 3/10 with ems, now 7/10 upon arrival to ED. Pt given 324 ASA en route, no nitro. Pt sinus rhythm on the monitor. VSS, nad. Pt a/ox4

## 2017-03-08 DIAGNOSIS — I119 Hypertensive heart disease without heart failure: Secondary | ICD-10-CM | POA: Diagnosis not present

## 2017-03-08 DIAGNOSIS — Z01118 Encounter for examination of ears and hearing with other abnormal findings: Secondary | ICD-10-CM | POA: Diagnosis not present

## 2017-03-08 DIAGNOSIS — D509 Iron deficiency anemia, unspecified: Secondary | ICD-10-CM | POA: Diagnosis not present

## 2017-03-08 DIAGNOSIS — I10 Essential (primary) hypertension: Secondary | ICD-10-CM | POA: Diagnosis not present

## 2017-03-08 DIAGNOSIS — R7303 Prediabetes: Secondary | ICD-10-CM | POA: Diagnosis not present

## 2017-03-08 DIAGNOSIS — F329 Major depressive disorder, single episode, unspecified: Secondary | ICD-10-CM | POA: Diagnosis not present

## 2017-03-27 ENCOUNTER — Encounter (HOSPITAL_COMMUNITY): Payer: Self-pay | Admitting: Emergency Medicine

## 2017-03-27 ENCOUNTER — Emergency Department (HOSPITAL_COMMUNITY): Payer: Medicare Other

## 2017-03-27 ENCOUNTER — Emergency Department (HOSPITAL_COMMUNITY)
Admission: EM | Admit: 2017-03-27 | Discharge: 2017-03-27 | Disposition: A | Discharge status: Home / Self Care | Payer: Medicare Other | Attending: Emergency Medicine | Admitting: Emergency Medicine

## 2017-03-27 DIAGNOSIS — Z8673 Personal history of transient ischemic attack (TIA), and cerebral infarction without residual deficits: Secondary | ICD-10-CM | POA: Insufficient documentation

## 2017-03-27 DIAGNOSIS — I10 Essential (primary) hypertension: Secondary | ICD-10-CM | POA: Diagnosis not present

## 2017-03-27 DIAGNOSIS — M1990 Unspecified osteoarthritis, unspecified site: Secondary | ICD-10-CM | POA: Diagnosis not present

## 2017-03-27 DIAGNOSIS — J45909 Unspecified asthma, uncomplicated: Secondary | ICD-10-CM | POA: Insufficient documentation

## 2017-03-27 DIAGNOSIS — Z79899 Other long term (current) drug therapy: Secondary | ICD-10-CM | POA: Diagnosis not present

## 2017-03-27 DIAGNOSIS — Z859 Personal history of malignant neoplasm, unspecified: Secondary | ICD-10-CM | POA: Insufficient documentation

## 2017-03-27 DIAGNOSIS — N390 Urinary tract infection, site not specified: Secondary | ICD-10-CM | POA: Insufficient documentation

## 2017-03-27 DIAGNOSIS — M25512 Pain in left shoulder: Secondary | ICD-10-CM | POA: Diagnosis present

## 2017-03-27 LAB — CBC WITH DIFFERENTIAL/PLATELET
Basophils Absolute: 0 10*3/uL (ref 0.0–0.1)
Basophils Relative: 1 %
EOS PCT: 1 %
Eosinophils Absolute: 0 10*3/uL (ref 0.0–0.7)
HEMATOCRIT: 36.1 % (ref 36.0–46.0)
Hemoglobin: 11.5 g/dL — ABNORMAL LOW (ref 12.0–15.0)
LYMPHS PCT: 30 %
Lymphs Abs: 2.5 10*3/uL (ref 0.7–4.0)
MCH: 27.8 pg (ref 26.0–34.0)
MCHC: 31.9 g/dL (ref 30.0–36.0)
MCV: 87.4 fL (ref 78.0–100.0)
MONO ABS: 0.7 10*3/uL (ref 0.1–1.0)
MONOS PCT: 8 %
NEUTROS ABS: 5.1 10*3/uL (ref 1.7–7.7)
Neutrophils Relative %: 60 %
Platelets: 232 10*3/uL (ref 150–400)
RBC: 4.13 MIL/uL (ref 3.87–5.11)
RDW: 15.2 % (ref 11.5–15.5)
WBC: 8.3 10*3/uL (ref 4.0–10.5)

## 2017-03-27 LAB — COMPREHENSIVE METABOLIC PANEL
ALBUMIN: 4.2 g/dL (ref 3.5–5.0)
ALK PHOS: 64 U/L (ref 38–126)
ALT: 14 U/L (ref 14–54)
ANION GAP: 12 (ref 5–15)
AST: 26 U/L (ref 15–41)
BUN: 12 mg/dL (ref 6–20)
CALCIUM: 9.3 mg/dL (ref 8.9–10.3)
CO2: 28 mmol/L (ref 22–32)
Chloride: 97 mmol/L — ABNORMAL LOW (ref 101–111)
Creatinine, Ser: 1.14 mg/dL — ABNORMAL HIGH (ref 0.44–1.00)
GFR calc Af Amer: 52 mL/min — ABNORMAL LOW (ref >60)
GFR calc non Af Amer: 45 mL/min — ABNORMAL LOW (ref >60)
GLUCOSE: 84 mg/dL (ref 65–99)
POTASSIUM: 3.4 mmol/L — AB (ref 3.5–5.1)
SODIUM: 137 mmol/L (ref 135–145)
Total Bilirubin: 1 mg/dL (ref 0.3–1.2)
Total Protein: 7.8 g/dL (ref 6.5–8.1)

## 2017-03-27 LAB — URINALYSIS, ROUTINE W REFLEX MICROSCOPIC
Bilirubin Urine: NEGATIVE
Glucose, UA: NEGATIVE mg/dL
Hgb urine dipstick: NEGATIVE
KETONES UR: NEGATIVE mg/dL
Nitrite: NEGATIVE
PH: 7 (ref 5.0–8.0)
Protein, ur: 30 mg/dL — AB
SPECIFIC GRAVITY, URINE: 1.009 (ref 1.005–1.030)

## 2017-03-27 LAB — I-STAT CG4 LACTIC ACID, ED: Lactic Acid, Venous: 1.4 mmol/L (ref 0.5–1.9)

## 2017-03-27 MED ORDER — CIPROFLOXACIN HCL 500 MG PO TABS
500.0000 mg | ORAL_TABLET | Freq: Once | ORAL | Status: AC
  Administered 2017-03-27: 500 mg via ORAL
  Filled 2017-03-27: qty 1

## 2017-03-27 MED ORDER — HYDRALAZINE HCL 20 MG/ML IJ SOLN
10.0000 mg | Freq: Once | INTRAMUSCULAR | Status: AC
  Administered 2017-03-27: 10 mg via INTRAVENOUS
  Filled 2017-03-27: qty 1

## 2017-03-27 MED ORDER — PREDNISONE 20 MG PO TABS: 40.0000 mg | ORAL_TABLET | Freq: Every day | ORAL | 0 refills | Status: DC

## 2017-03-27 MED ORDER — METHYLPREDNISOLONE SODIUM SUCC 125 MG IJ SOLR
125.0000 mg | Freq: Once | INTRAMUSCULAR | Status: AC
  Administered 2017-03-27: 125 mg via INTRAVENOUS
  Filled 2017-03-27: qty 2

## 2017-03-27 MED ORDER — CIPROFLOXACIN HCL 500 MG PO TABS: 500.0000 mg | ORAL_TABLET | Freq: Two times a day (BID) | ORAL | 0 refills | Status: DC

## 2017-03-27 MED ORDER — HYDRALAZINE HCL 25 MG PO TABS
50.0000 mg | ORAL_TABLET | Freq: Once | ORAL | Status: AC
  Administered 2017-03-27: 50 mg via ORAL
  Filled 2017-03-27: qty 2

## 2017-03-27 NOTE — ED Triage Notes (Signed)
Pt arrives to ED with complaints of generalized body aches, weakness, running nose, chills x 3days. Complains of increased urinary frequency and incontinent episodes. Denies fevers, N/V.

## 2017-03-27 NOTE — ED Notes (Signed)
Granddaughter, Primitivo Gauze, is coming to pick patient up.

## 2017-03-27 NOTE — Discharge Instructions (Signed)
Please see your doctor for a recheck in 3 days Please have your blood pressure rechecked at your next visit Take all of your medicines including your blood pressure medicines  as prescribed  Take Cipro twice daily for 7 days  Prednisone daily for 5 days  ER for worsening pain / swelling / fevers

## 2017-03-27 NOTE — ED Provider Notes (Addendum)
[facility] DEPT Provider Note   CSN: [id number] Arrival date & time: 03/27/17  1153     History   Chief Complaint Chief Complaint  Patient presents with  . Fatigue  . Generalized Body Aches    HPI Kerri Hernandez is a 81 y.o. female.  HPI  The patient is a 81 year old female, she has a known history of rheumatoid arthritis, hypertension and has had a prior stroke.  The electronic medical record was reviewed and showed that the patient had been admitted in 2012 during which time she had a transient ischemic attack which was thought to be likely, she was also hypokalemic because of diuretic therapy and had new onset diabetes during that admission.  She reports that several years ago she had to be admitted to an outside hospital in [place] when she was visiting when she had diffuse pain thought to be related to rheumatoid arthritis, she does not remember any other specific diagnosis, she does remember being on prednisone. She now reports that approximately 3 days ago she developed recurrent pain in her bilateral shoulders and bilateral knees specifically though she does have pain and some other joints including her wrists and ankles. She denies fevers but states that she might have the occasional chill and does endorse having urinary frequency which is unusual for her. She has no swelling in the legs, no chest pain, no coughing, no headache, no blurred vision, no difficulty with speaking or eating. She denies nausea or vomiting.  PCP:  Benito Mccreedy ([facility])  Past Medical History:  Diagnosis Date  . Asthma   . Cancer ([place])   . Hypertension   . Stroke [facility])     There are no active problems to display for this patient.   Past Surgical History:  Procedure Laterality Date  . ABDOMINAL HYSTERECTOMY    . MASTECTOMY      OB History    No data available       Home Medications    Prior to Admission medications   Medication Sig Start Date End Date Taking?  Authorizing Provider  potassium chloride SA (K-DUR,KLOR-CON) 20 MEQ tablet Take 1 tablet (20 mEq total) by mouth 2 (two) times daily. Patient taking differently: Take 20 mEq by mouth daily.  11/11/76  Yes Delora Fuel, MD  acetaminophen (TYLENOL) 325 MG tablet Take 650 mg by mouth daily.    [provider]  ARIPiprazole (ABILIFY) 30 MG tablet Take 30 mg by mouth daily.    [provider]  CALCIUM PO Take 1 tablet by mouth daily.    [provider]  Cholecalciferol (VITAMIN D PO) Take 1 tablet by mouth daily.    [provider]  ciprofloxacin (CIPRO) 500 MG tablet Take 1 tablet (500 mg total) by mouth 2 (two) times daily. 03/27/17   Noemi Chapel, MD  ferrous sulfate 325 (65 FE) MG tablet Take 325 mg by mouth daily.    [provider]  fluticasone (FLONASE) 50 MCG/ACT nasal spray Place 2 sprays into both nostrils daily.     [provider]  furosemide (LASIX) 40 MG tablet Take 20 mg by mouth daily.    [provider]  losartan (COZAAR) 100 MG tablet Take 100 mg by mouth daily.    [provider]  Multiple Vitamin (MULTIVITAMIN WITH MINERALS) TABS tablet Take 1 tablet by mouth daily.    [provider]  pantoprazole (PROTONIX) 40 MG tablet Take 40 mg by mouth daily.    [provider]  predniSONE (DELTASONE) 20 MG tablet Take 2 tablets (40 mg total) by mouth daily. 03/27/17   Noemi Chapel, MD  venlafaxine XR (EFFEXOR-XR) 150 MG 24 hr capsule Take 150 mg by mouth daily with breakfast.    [provider]  Vitamins C E (VITAMIN C & E COMPLEX PO) Take 1 tablet by mouth daily.    [provider]    Family History No family history on file.  Social History Social History  Substance Use Topics  . Smoking status: Never Smoker  . Smokeless tobacco: Never Used  . Alcohol use No     Allergies   Influenza vaccines; Aspirin; Penicillins; and Sulfa antibiotics   Review of Systems Review of  Systems  All other systems reviewed and are negative.    Physical Exam Updated Vital Signs BP (!) 169/82   Pulse 61   Temp 98.5 F (36.9 C) (Oral)   Resp 18   Ht 5\' 3"  (1.6 m)   Wt 58.5 kg (129 lb)   SpO2 100%   BMI 22.85 kg/m   Physical Exam  Constitutional: She appears well-developed and well-nourished. No distress.  HENT:  Head: Normocephalic and atraumatic.  Mouth/Throat: Oropharynx is clear and moist. No oropharyngeal exudate.  Eyes: Pupils are equal, round, and reactive to light. Conjunctivae and EOM are normal. Right eye exhibits no discharge. Left eye exhibits no discharge. No scleral icterus.  Neck: Normal range of motion. Neck supple. No JVD present. No thyromegaly present.  Cardiovascular: Normal rate, regular rhythm, normal heart sounds and intact distal pulses.  Exam reveals no gallop and no friction rub.   No murmur heard. Pulmonary/Chest: Effort normal and breath sounds normal. No respiratory distress. She has no wheezes. She has no rales.  Abdominal: Soft. Bowel sounds are normal. She exhibits no distension and no mass. There is no tenderness.  Musculoskeletal: Normal range of motion. She exhibits tenderness. She exhibits no edema.  The patient has tenderness with range of motion of the bilateral knees and the bilateral ankles, wrists and shoulders. There is no warmth or specific swelling or effusions to the joints involved, no redness of the skin  Lymphadenopathy:    She has no cervical adenopathy.  Neurological: She is alert. Coordination normal.  The patient is able to follow commands without difficulty, she has normal strength in her grips, normal speech, normal memory cranial nerves III through XII appear normal  Skin: Skin is warm and dry. No rash noted. No erythema.  Psychiatric: She has a normal mood and affect. Her behavior is normal.  Nursing note and vitals reviewed.    ED Treatments / Results  Labs (all labs ordered are listed, but only abnormal  results are displayed) Labs Reviewed  COMPREHENSIVE METABOLIC PANEL - Abnormal; Notable for the following:       Result Value   Potassium 3.4 (*)    Chloride 97 (*)    Creatinine, Ser 1.14 (*)    GFR calc non Af Amer 45 (*)    GFR calc Af Amer 52 (*)    All other components within normal limits  CBC WITH DIFFERENTIAL/PLATELET - Abnormal; Notable for the following:    Hemoglobin 11.5 (*)    All other components within normal limits  URINALYSIS, ROUTINE W REFLEX MICROSCOPIC - Abnormal; Notable for the following:    Protein, ur 30 (*)    Leukocytes, UA LARGE (*)    Bacteria, UA RARE (*)    Squamous Epithelial / LPF 0-5 (*)  All other components within normal limits  URINE CULTURE  I-STAT CG4 LACTIC ACID, ED    EKG  EKG Interpretation None       Radiology Dg Chest 2 View  Result Date: 03/27/2017 CLINICAL DATA:  Productive cough, body aches, weakness and chills for 3 days. EXAM: CHEST  2 VIEW COMPARISON:  01/25/2017 FINDINGS: The heart is enlarged but stable. Moderate tortuosity of the thoracic aorta. The lungs are clear. Mild hyperinflation. The bony thorax is intact. IMPRESSION: Stable cardiac enlargement and tortuous thoracic aorta. No acute pulmonary findings. Electronically Signed   By: Marijo Sanes M.D.   On: 03/27/2017 14:12    Procedures Procedures (including critical care time)  Medications Ordered in ED Medications  hydrALAZINE (APRESOLINE) tablet 50 mg (50 mg Oral Given 03/27/17 1801)  methylPREDNISolone sodium succinate (SOLU-MEDROL) 125 mg/2 mL injection 125 mg (125 mg Intravenous Given 03/27/17 1938)  hydrALAZINE (APRESOLINE) injection 10 mg (10 mg Intravenous Given 03/27/17 1938)  ciprofloxacin (CIPRO) tablet 500 mg (500 mg Oral Given 03/27/17 2047)     Initial Impression / Assessment and Plan / ED Course  I have reviewed the triage vital signs and the nursing notes.  Pertinent labs & imaging results that were available during my care of the patient  were reviewed by me and considered in my medical decision making (see chart for details).     Blood pressures are over 200 healing the patient significantly hypertensive though clinically the patient does not appear to be ill appearing. She has no cardiac dysfunction on exam, she has a heart rate around 60 with normal heart sounds, no JVD and no edema.  Joints are painful - states she carries a Hx of RA.  Give dose of Medrol - also check labs and ECG  Labs are unremarkable including CBC and CMP howevere there are some signs of UTI - culture sent  Hydralazine with good improvement in BP - pt counseled to take meds as rx and f/u for recheck in 3 days  Cipro bid X 7 days for possible UTI  Prednisone daily for 5 days for joint pains - presumed from RA flare  Has f/u with PCP and is agreeable to same.  Vitals:   03/27/17 1945 03/27/17 2000 03/27/17 2015 03/27/17 2045  BP: (!) 170/90 (!) 174/77 (!) 186/77 (!) 169/82  Pulse: 68 71 68 61  Resp: (!) 24 19 18 18   Temp:      TempSrc:      SpO2: 100% 100% 100% 100%  Weight:      Height:         Final Clinical Impressions(s) / ED Diagnoses   Final diagnoses:  Arthritis  Hypertension, unspecified type  Urinary tract infection without hematuria, site unspecified    New Prescriptions New Prescriptions   CIPROFLOXACIN (CIPRO) 500 MG TABLET    Take 1 tablet (500 mg total) by mouth 2 (two) times daily.   PREDNISONE (DELTASONE) 20 MG TABLET    Take 2 tablets (40 mg total) by mouth daily.     Noemi Chapel, MD 03/27/17 2100    Noemi Chapel, MD 03/27/17 [phone]

## 2017-03-27 NOTE — ED Notes (Signed)
Ambulated pt. To bathroom at 18:15. She reported painful, stiff knees and moved very slowly there and back. While in the bathroom, she also stated she was experiencing high anxiety, and was shaking while attempting to provide a urine sample.

## 2017-03-29 LAB — URINE CULTURE

## 2017-04-02 DIAGNOSIS — D509 Iron deficiency anemia, unspecified: Secondary | ICD-10-CM | POA: Diagnosis not present

## 2017-04-02 DIAGNOSIS — N289 Disorder of kidney and ureter, unspecified: Secondary | ICD-10-CM | POA: Diagnosis not present

## 2017-04-02 DIAGNOSIS — F329 Major depressive disorder, single episode, unspecified: Secondary | ICD-10-CM | POA: Diagnosis not present

## 2017-04-02 DIAGNOSIS — I10 Essential (primary) hypertension: Secondary | ICD-10-CM | POA: Diagnosis not present

## 2017-04-02 DIAGNOSIS — I119 Hypertensive heart disease without heart failure: Secondary | ICD-10-CM | POA: Diagnosis not present

## 2017-04-02 DIAGNOSIS — E785 Hyperlipidemia, unspecified: Secondary | ICD-10-CM | POA: Diagnosis not present

## 2017-04-11 ENCOUNTER — Encounter (HOSPITAL_COMMUNITY): Payer: Self-pay

## 2017-04-11 ENCOUNTER — Emergency Department (HOSPITAL_COMMUNITY)
Admission: EM | Admit: 2017-04-11 | Discharge: 2017-04-11 | Disposition: A | Discharge status: Home / Self Care | Payer: Medicare Other | Attending: Emergency Medicine | Admitting: Emergency Medicine

## 2017-04-11 DIAGNOSIS — Z853 Personal history of malignant neoplasm of breast: Secondary | ICD-10-CM | POA: Insufficient documentation

## 2017-04-11 DIAGNOSIS — I10 Essential (primary) hypertension: Secondary | ICD-10-CM | POA: Insufficient documentation

## 2017-04-11 DIAGNOSIS — R350 Frequency of micturition: Secondary | ICD-10-CM | POA: Diagnosis not present

## 2017-04-11 DIAGNOSIS — H9209 Otalgia, unspecified ear: Secondary | ICD-10-CM | POA: Diagnosis not present

## 2017-04-11 DIAGNOSIS — Z8673 Personal history of transient ischemic attack (TIA), and cerebral infarction without residual deficits: Secondary | ICD-10-CM | POA: Insufficient documentation

## 2017-04-11 DIAGNOSIS — Z79899 Other long term (current) drug therapy: Secondary | ICD-10-CM | POA: Diagnosis not present

## 2017-04-11 DIAGNOSIS — H9202 Otalgia, left ear: Secondary | ICD-10-CM | POA: Diagnosis not present

## 2017-04-11 DIAGNOSIS — J45909 Unspecified asthma, uncomplicated: Secondary | ICD-10-CM | POA: Diagnosis not present

## 2017-04-11 DIAGNOSIS — Z638 Other specified problems related to primary support group: Secondary | ICD-10-CM | POA: Diagnosis not present

## 2017-04-11 DIAGNOSIS — R030 Elevated blood-pressure reading, without diagnosis of hypertension: Secondary | ICD-10-CM | POA: Diagnosis not present

## 2017-04-11 DIAGNOSIS — S0991XA Unspecified injury of ear, initial encounter: Secondary | ICD-10-CM | POA: Diagnosis not present

## 2017-04-11 DIAGNOSIS — R51 Headache: Secondary | ICD-10-CM | POA: Diagnosis not present

## 2017-04-11 HISTORY — DX: Unspecified osteoarthritis, unspecified site: M19.90

## 2017-04-11 LAB — URINALYSIS, ROUTINE W REFLEX MICROSCOPIC
Bilirubin Urine: NEGATIVE
GLUCOSE, UA: NEGATIVE mg/dL
Hgb urine dipstick: NEGATIVE
Ketones, ur: NEGATIVE mg/dL
Leukocytes, UA: NEGATIVE
Nitrite: NEGATIVE
PH: 6 (ref 5.0–8.0)
Protein, ur: NEGATIVE mg/dL
SPECIFIC GRAVITY, URINE: 1.01 (ref 1.005–1.030)

## 2017-04-11 NOTE — Clinical Social Work Placement (Signed)
   CLINICAL SOCIAL WORK PLACEMENT  NOTE  Date:  04/11/2017  Patient Details  Name: Kerri Hernandez MRN: [id number] Date of Birth: 05/02/1936  Clinical Social Work is seeking post-discharge placement for this patient at the [place] level of care (*CSW will initial, date and re-position this form in  chart as items are completed):  Yes   Patient/family provided with Advance Work Department's list of facilities offering this level of care within the geographic area requested by the patient (or if unable, by the patient's family).  Yes   Patient/family informed of their freedom to choose among providers that offer the needed level of care, that participate in Medicare, Medicaid or managed care program needed by the patient, have an available bed and are willing to accept the patient.  Yes   Patient/family informed of [facility] ownership interest in [facility] and [facility], as well as of the fact that they are under no obligation to receive care at these facilities.  PASRR submitted to EDS on 04/11/17     PASRR number received on 04/11/17     [id number]  Existing PASRR number confirmed on       FL2 transmitted to all facilities in geographic area requested by pt/family on 04/11/17     FL2 transmitted to all facilities within larger geographic area on       Patient informed that his/her managed care company has contracts with or will negotiate with certain facilities, including the following:            Patient/family informed of bed offers received.  Patient chooses bed at       Physician recommends and patient chooses bed at [facility]    Patient to be transferred to [facility] on 04/18/17.  Patient to be transferred to facility by Family     Patient family notified on 04/11/17 of transfer.  Name of family member notified:        PHYSICIAN       Additional Comment:     _______________________________________________ Lilly Cove, LCSW 04/11/2017, 1:39 PM
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
## 2017-04-11 NOTE — ED Notes (Signed)
Social work is going to come back to assess and let staff know about placement.

## 2017-04-11 NOTE — ED Notes (Signed)
Bed: [facility] Expected date:  Expected time:  Means of arrival:  Comments: EMS 81 yo female from home/arthritis pain-out of pain medication 194/112-not ambulatory
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
## 2017-04-11 NOTE — NC FL2 (Signed)
[place] LEVEL OF CARE SCREENING TOOL     IDENTIFICATION  Patient Name: Kerri Hernandez Birthdate: 1936/03/10 Sex: female Admission Date (Current Location): 04/11/2017  [facility] and [place] Number:  Herbalist and Address:  [facility],  [place] [address], [place]      Provider Number: [id number]  Attending Physician Name and Address:  Jola Schmidt, MD  Relative Name and Phone Number:       Current Level of Care: Hospital Recommended Level of Care: [place] Prior Approval Number:    Date Approved/Denied:   PASRR Number:    Discharge Plan: SNF    Current Diagnoses: There are no active problems to display for this patient.   Orientation RESPIRATION BLADDER Height & Weight     Self, Time, Situation, Place  Normal Continent Weight:   Height:     BEHAVIORAL SYMPTOMS/MOOD NEUROLOGICAL BOWEL NUTRITION STATUS      Continent Diet (regular diet)  AMBULATORY STATUS COMMUNICATION OF NEEDS Skin   Limited Assist Verbally Normal                       Personal Care Assistance Level of Assistance  Bathing, Feeding, Dressing Bathing Assistance: Limited assistance Feeding assistance: Independent Dressing Assistance: Limited assistance     Functional Limitations Info  Sight, Hearing, Speech Sight Info: Adequate Hearing Info: Adequate Speech Info: Adequate    SPECIAL CARE FACTORS FREQUENCY  PT (By licensed PT), OT (By licensed OT)     PT Frequency: 5x OT Frequency: 5x            Contractures Contractures Info: Not present    Additional Factors Info  Code Status, Allergies Code Status Info:  Not on File Allergies Info: Influenza Vaccines, Aspirin, Penicillins, Sulfa Antibiotics           Current Medications (04/11/2017):  This is the current hospital active medication list No current facility-administered medications for this encounter.    Current Outpatient Prescriptions  Medication Sig  Dispense Refill  . acetaminophen (TYLENOL) 325 MG tablet Take 650 mg by mouth daily.    . ARIPiprazole (ABILIFY) 30 MG tablet Take 30 mg by mouth daily.    Marland Kitchen CALCIUM PO Take 1 tablet by mouth daily.    . Cholecalciferol (VITAMIN D PO) Take 1 tablet by mouth daily.    . ciprofloxacin (CIPRO) 500 MG tablet Take 1 tablet (500 mg total) by mouth 2 (two) times daily. 14 tablet 0  . ferrous sulfate 325 (65 FE) MG tablet Take 325 mg by mouth daily.    . fluticasone (FLONASE) 50 MCG/ACT nasal spray Place 2 sprays into both nostrils daily.     . furosemide (LASIX) 40 MG tablet Take 20 mg by mouth daily.    Marland Kitchen losartan (COZAAR) 100 MG tablet Take 100 mg by mouth daily.    . Multiple Vitamin (MULTIVITAMIN WITH MINERALS) TABS tablet Take 1 tablet by mouth daily.    . pantoprazole (PROTONIX) 40 MG tablet Take 40 mg by mouth daily.    . potassium chloride SA (K-DUR,KLOR-CON) 20 MEQ tablet Take 1 tablet (20 mEq total) by mouth 2 (two) times daily. (Patient taking differently: Take 20 mEq by mouth daily. ) 15 tablet 0  . predniSONE (DELTASONE) 20 MG tablet Take 2 tablets (40 mg total) by mouth daily. 10 tablet 0  . venlafaxine XR (EFFEXOR-XR) 150 MG 24 hr capsule Take 150 mg by mouth daily with breakfast.    .  Vitamins C E (VITAMIN C & E COMPLEX PO) Take 1 tablet by mouth daily.       Discharge Medications: Please see discharge summary for a list of discharge medications.  Relevant Imaging Results:  Relevant Lab Results:   Additional Information SSN: [id number]  Lilly Cove, [place]

## 2017-04-11 NOTE — Progress Notes (Signed)
LCSW followed up with Kerri Hernandez  [phone]  This was thought to be patient's APS worker, however this is her MEDICAID worker! Who is helping establish medicaid [place] for her long term placement.  Updated worker regarding her status and FL2 sent to the facility. Mr. Kerri Hernandez is going to switch her medicaid over to [place] pending this FL2 in which LCSW is sending. NO other needs.    Patient discharged home to await placement  On 04/18/17.  Kerri Hernandez, MSW Clinical Social Work: [employer] Coverage for :  [phone]
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
## 2017-04-11 NOTE — ED Notes (Signed)
[facility] called for transport.  

## 2017-04-17 ENCOUNTER — Emergency Department (HOSPITAL_COMMUNITY): Payer: Medicare Other

## 2017-04-17 ENCOUNTER — Emergency Department (HOSPITAL_COMMUNITY)
Admission: EM | Admit: 2017-04-17 | Discharge: 2017-04-17 | Disposition: A | Discharge status: Home / Self Care | Payer: Medicare Other | Attending: Emergency Medicine | Admitting: Emergency Medicine

## 2017-04-17 ENCOUNTER — Encounter (HOSPITAL_COMMUNITY): Payer: Self-pay | Admitting: Emergency Medicine

## 2017-04-17 DIAGNOSIS — Z79899 Other long term (current) drug therapy: Secondary | ICD-10-CM | POA: Diagnosis not present

## 2017-04-17 DIAGNOSIS — J45909 Unspecified asthma, uncomplicated: Secondary | ICD-10-CM | POA: Insufficient documentation

## 2017-04-17 DIAGNOSIS — E876 Hypokalemia: Secondary | ICD-10-CM | POA: Insufficient documentation

## 2017-04-17 DIAGNOSIS — I10 Essential (primary) hypertension: Secondary | ICD-10-CM

## 2017-04-17 DIAGNOSIS — R0981 Nasal congestion: Secondary | ICD-10-CM

## 2017-04-17 DIAGNOSIS — J00 Acute nasopharyngitis [common cold]: Secondary | ICD-10-CM | POA: Diagnosis not present

## 2017-04-17 DIAGNOSIS — J80 Acute respiratory distress syndrome: Secondary | ICD-10-CM | POA: Diagnosis not present

## 2017-04-17 DIAGNOSIS — R4182 Altered mental status, unspecified: Secondary | ICD-10-CM | POA: Diagnosis not present

## 2017-04-17 DIAGNOSIS — R69 Illness, unspecified: Secondary | ICD-10-CM | POA: Diagnosis not present

## 2017-04-17 LAB — CBC WITH DIFFERENTIAL/PLATELET
Basophils Absolute: 0 10*3/uL (ref 0.0–0.1)
Basophils Relative: 1 %
Eosinophils Absolute: 0.1 10*3/uL (ref 0.0–0.7)
Eosinophils Relative: 1 %
HCT: 36.2 % (ref 36.0–46.0)
Hemoglobin: 11.8 g/dL — ABNORMAL LOW (ref 12.0–15.0)
LYMPHS ABS: 2.7 10*3/uL (ref 0.7–4.0)
LYMPHS PCT: 34 %
MCH: 28.4 pg (ref 26.0–34.0)
MCHC: 32.6 g/dL (ref 30.0–36.0)
MCV: 87 fL (ref 78.0–100.0)
Monocytes Absolute: 0.7 10*3/uL (ref 0.1–1.0)
Monocytes Relative: 9 %
NEUTROS ABS: 4.3 10*3/uL (ref 1.7–7.7)
NEUTROS PCT: 55 %
Platelets: 202 10*3/uL (ref 150–400)
RBC: 4.16 MIL/uL (ref 3.87–5.11)
RDW: 14.9 % (ref 11.5–15.5)
WBC: 7.9 10*3/uL (ref 4.0–10.5)

## 2017-04-17 LAB — COMPREHENSIVE METABOLIC PANEL
ALK PHOS: 56 U/L (ref 38–126)
ALT: 12 U/L — AB (ref 14–54)
AST: 23 U/L (ref 15–41)
Albumin: 4.1 g/dL (ref 3.5–5.0)
Anion gap: 14 (ref 5–15)
BUN: 19 mg/dL (ref 6–20)
CALCIUM: 9.5 mg/dL (ref 8.9–10.3)
CO2: 29 mmol/L (ref 22–32)
CREATININE: 0.95 mg/dL (ref 0.44–1.00)
Chloride: 96 mmol/L — ABNORMAL LOW (ref 101–111)
GFR, EST NON AFRICAN AMERICAN: 56 mL/min — AB (ref >60)
Glucose, Bld: 91 mg/dL (ref 65–99)
Potassium: 2.9 mmol/L — ABNORMAL LOW (ref 3.5–5.1)
SODIUM: 139 mmol/L (ref 135–145)
Total Bilirubin: 1 mg/dL (ref 0.3–1.2)
Total Protein: 7.9 g/dL (ref 6.5–8.1)

## 2017-04-17 LAB — I-STAT TROPONIN, ED: Troponin i, poc: 0.01 ng/mL (ref 0.00–0.08)

## 2017-04-17 MED ORDER — HYDRALAZINE HCL 20 MG/ML IJ SOLN
10.0000 mg | Freq: Once | INTRAMUSCULAR | Status: AC
  Administered 2017-04-17: 10 mg via INTRAVENOUS
  Filled 2017-04-17: qty 1

## 2017-04-17 MED ORDER — POTASSIUM CHLORIDE CRYS ER 20 MEQ PO TBCR
40.0000 meq | EXTENDED_RELEASE_TABLET | Freq: Once | ORAL | Status: AC
  Administered 2017-04-17: 40 meq via ORAL
  Filled 2017-04-17: qty 2

## 2017-04-17 MED ORDER — POTASSIUM CHLORIDE CRYS ER 20 MEQ PO TBCR: 20.0000 meq | EXTENDED_RELEASE_TABLET | Freq: Every day | ORAL | 0 refills | Status: DC

## 2017-04-17 MED ORDER — LOSARTAN POTASSIUM 50 MG PO TABS
100.0000 mg | ORAL_TABLET | Freq: Once | ORAL | Status: AC
  Administered 2017-04-17: 100 mg via ORAL
  Filled 2017-04-17: qty 2

## 2017-04-17 MED ORDER — OXYMETAZOLINE HCL 0.05 % NA SOLN: 1.0000 | Freq: Once | NASAL | Status: DC

## 2017-04-17 NOTE — ED Notes (Signed)
Pt transported to CT ?

## 2017-04-17 NOTE — ED Notes (Signed)
[facility] arrived.  

## 2017-04-17 NOTE — Discharge Instructions (Signed)
Continue flonase for congestion.   Your potassium is low. Take potassium 20 meq daily. Repeat potassium level in a week.   Take your blood pressure medicines.   Your daughter in law will bring you to [facility] care tomorrow for nursing home placement   Return to ER if you have worse headaches, chest pain, trouble breathing, vomiting, abdominal pain.

## 2017-04-17 NOTE — ED Triage Notes (Signed)
Per EMS-states nasal congestion that started last night-states she cant get any relief-
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
## 2017-04-17 NOTE — ED Notes (Signed)
Social worker contacted as requested and he stated that he was calling for the police escort to meet her at her house.

## 2017-04-17 NOTE — ED Notes (Signed)
[facility] called. They advised that it would be a long wait due to their patient load.
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
## 2017-04-17 NOTE — Progress Notes (Addendum)
Consult request has been received. CSW attempting to follow up at present time.  CSW spoke to pt's EDP.  Pt is due to go to [facility] on 11/1, per pt.  EDP is requesting confirmation.  7:13 PM CSW spoke to Santiago Glad at [facility] who stated pt is using her Medicaid for her stay on 11/1 and thus, has to wait until 11/1 to be admitted and her daughter is to transport the pt with her SS check to be admitted at that time.    CSW will update EDP  8:00 PM EDP updated.  Per EDP, once pt is medically cleared pt will be D/C'd home to transport to [facility] tomorrow (11/1) with her daughter to be admitted.  CSW will speak to pt and with pt's daughter.  CSW will continue to follow for D/C needs.  Alphonse Guild. Erdem Naas, LCSW, LCAS, CSI Clinical Social Worker Ph: [phone]
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
## 2017-04-19 DIAGNOSIS — M25569 Pain in unspecified knee: Secondary | ICD-10-CM | POA: Diagnosis not present

## 2017-04-19 DIAGNOSIS — I10 Essential (primary) hypertension: Secondary | ICD-10-CM | POA: Diagnosis not present

## 2017-04-19 DIAGNOSIS — M6281 Muscle weakness (generalized): Secondary | ICD-10-CM | POA: Diagnosis not present

## 2017-04-19 DIAGNOSIS — R262 Difficulty in walking, not elsewhere classified: Secondary | ICD-10-CM | POA: Diagnosis not present

## 2017-04-21 DIAGNOSIS — M6281 Muscle weakness (generalized): Secondary | ICD-10-CM | POA: Diagnosis not present

## 2017-04-21 DIAGNOSIS — I10 Essential (primary) hypertension: Secondary | ICD-10-CM | POA: Diagnosis not present

## 2017-04-21 DIAGNOSIS — R262 Difficulty in walking, not elsewhere classified: Secondary | ICD-10-CM | POA: Diagnosis not present

## 2017-04-21 DIAGNOSIS — M25569 Pain in unspecified knee: Secondary | ICD-10-CM | POA: Diagnosis not present

## 2017-04-22 DIAGNOSIS — I10 Essential (primary) hypertension: Secondary | ICD-10-CM | POA: Diagnosis not present

## 2017-04-22 DIAGNOSIS — M6281 Muscle weakness (generalized): Secondary | ICD-10-CM | POA: Diagnosis not present

## 2017-04-22 DIAGNOSIS — M25569 Pain in unspecified knee: Secondary | ICD-10-CM | POA: Diagnosis not present

## 2017-04-22 DIAGNOSIS — R262 Difficulty in walking, not elsewhere classified: Secondary | ICD-10-CM | POA: Diagnosis not present

## 2017-04-23 DIAGNOSIS — R262 Difficulty in walking, not elsewhere classified: Secondary | ICD-10-CM | POA: Diagnosis not present

## 2017-04-23 DIAGNOSIS — I10 Essential (primary) hypertension: Secondary | ICD-10-CM | POA: Diagnosis not present

## 2017-04-23 DIAGNOSIS — M6281 Muscle weakness (generalized): Secondary | ICD-10-CM | POA: Diagnosis not present

## 2017-04-23 DIAGNOSIS — M25569 Pain in unspecified knee: Secondary | ICD-10-CM | POA: Diagnosis not present

## 2017-04-24 DIAGNOSIS — I10 Essential (primary) hypertension: Secondary | ICD-10-CM | POA: Diagnosis not present

## 2017-04-24 DIAGNOSIS — M6281 Muscle weakness (generalized): Secondary | ICD-10-CM | POA: Diagnosis not present

## 2017-04-24 DIAGNOSIS — R262 Difficulty in walking, not elsewhere classified: Secondary | ICD-10-CM | POA: Diagnosis not present

## 2017-04-24 DIAGNOSIS — M25569 Pain in unspecified knee: Secondary | ICD-10-CM | POA: Diagnosis not present

## 2017-04-25 DIAGNOSIS — I10 Essential (primary) hypertension: Secondary | ICD-10-CM | POA: Diagnosis not present

## 2017-04-25 DIAGNOSIS — R262 Difficulty in walking, not elsewhere classified: Secondary | ICD-10-CM | POA: Diagnosis not present

## 2017-04-25 DIAGNOSIS — M6281 Muscle weakness (generalized): Secondary | ICD-10-CM | POA: Diagnosis not present

## 2017-04-25 DIAGNOSIS — M25569 Pain in unspecified knee: Secondary | ICD-10-CM | POA: Diagnosis not present

## 2017-04-26 DIAGNOSIS — R262 Difficulty in walking, not elsewhere classified: Secondary | ICD-10-CM | POA: Diagnosis not present

## 2017-04-26 DIAGNOSIS — M6281 Muscle weakness (generalized): Secondary | ICD-10-CM | POA: Diagnosis not present

## 2017-04-26 DIAGNOSIS — M25569 Pain in unspecified knee: Secondary | ICD-10-CM | POA: Diagnosis not present

## 2017-04-26 DIAGNOSIS — I10 Essential (primary) hypertension: Secondary | ICD-10-CM | POA: Diagnosis not present

## 2017-04-28 DIAGNOSIS — N390 Urinary tract infection, site not specified: Secondary | ICD-10-CM | POA: Diagnosis not present

## 2017-04-29 ENCOUNTER — Observation Stay (HOSPITAL_BASED_OUTPATIENT_CLINIC_OR_DEPARTMENT_OTHER): Payer: Medicare Other

## 2017-04-29 ENCOUNTER — Emergency Department (HOSPITAL_COMMUNITY): Payer: Medicare Other

## 2017-04-29 ENCOUNTER — Observation Stay (HOSPITAL_COMMUNITY)
Admission: EM | Admit: 2017-04-29 | Discharge: 2017-04-30 | Disposition: A | Discharge status: Skilled Nursing Facility | Payer: Medicare Other | Attending: Internal Medicine | Admitting: Internal Medicine

## 2017-04-29 ENCOUNTER — Encounter (HOSPITAL_COMMUNITY): Payer: Self-pay | Admitting: Emergency Medicine

## 2017-04-29 ENCOUNTER — Other Ambulatory Visit: Payer: Self-pay

## 2017-04-29 DIAGNOSIS — I110 Hypertensive heart disease with heart failure: Secondary | ICD-10-CM | POA: Insufficient documentation

## 2017-04-29 DIAGNOSIS — L0591 Pilonidal cyst without abscess: Secondary | ICD-10-CM | POA: Insufficient documentation

## 2017-04-29 DIAGNOSIS — Z8249 Family history of ischemic heart disease and other diseases of the circulatory system: Secondary | ICD-10-CM | POA: Insufficient documentation

## 2017-04-29 DIAGNOSIS — Z7951 Long term (current) use of inhaled steroids: Secondary | ICD-10-CM | POA: Insufficient documentation

## 2017-04-29 DIAGNOSIS — Z79899 Other long term (current) drug therapy: Secondary | ICD-10-CM | POA: Insufficient documentation

## 2017-04-29 DIAGNOSIS — R079 Chest pain, unspecified: Secondary | ICD-10-CM | POA: Diagnosis not present

## 2017-04-29 DIAGNOSIS — R0789 Other chest pain: Principal | ICD-10-CM | POA: Insufficient documentation

## 2017-04-29 DIAGNOSIS — L8990 Pressure ulcer of unspecified site, unspecified stage: Secondary | ICD-10-CM

## 2017-04-29 DIAGNOSIS — M25561 Pain in right knee: Secondary | ICD-10-CM | POA: Insufficient documentation

## 2017-04-29 DIAGNOSIS — I10 Essential (primary) hypertension: Secondary | ICD-10-CM | POA: Diagnosis not present

## 2017-04-29 DIAGNOSIS — I509 Heart failure, unspecified: Secondary | ICD-10-CM | POA: Diagnosis not present

## 2017-04-29 DIAGNOSIS — R011 Cardiac murmur, unspecified: Secondary | ICD-10-CM | POA: Diagnosis not present

## 2017-04-29 DIAGNOSIS — I351 Nonrheumatic aortic (valve) insufficiency: Secondary | ICD-10-CM | POA: Diagnosis not present

## 2017-04-29 DIAGNOSIS — M25562 Pain in left knee: Secondary | ICD-10-CM | POA: Diagnosis not present

## 2017-04-29 DIAGNOSIS — M069 Rheumatoid arthritis, unspecified: Secondary | ICD-10-CM | POA: Insufficient documentation

## 2017-04-29 DIAGNOSIS — Z87891 Personal history of nicotine dependence: Secondary | ICD-10-CM | POA: Diagnosis not present

## 2017-04-29 DIAGNOSIS — E876 Hypokalemia: Secondary | ICD-10-CM | POA: Insufficient documentation

## 2017-04-29 HISTORY — DX: Heart failure, unspecified: I50.9

## 2017-04-29 HISTORY — DX: Transient cerebral ischemic attack, unspecified: G45.9

## 2017-04-29 LAB — ECHOCARDIOGRAM COMPLETE
Height: 63 in
WEIGHTICAEL: 2064 [oz_av]

## 2017-04-29 LAB — CBC
HCT: 34.5 % — ABNORMAL LOW (ref 36.0–46.0)
HEMOGLOBIN: 11 g/dL — AB (ref 12.0–15.0)
MCH: 28.1 pg (ref 26.0–34.0)
MCHC: 31.9 g/dL (ref 30.0–36.0)
MCV: 88.2 fL (ref 78.0–100.0)
PLATELETS: 251 10*3/uL (ref 150–400)
RBC: 3.91 MIL/uL (ref 3.87–5.11)
RDW: 15 % (ref 11.5–15.5)
WBC: 6.2 10*3/uL (ref 4.0–10.5)

## 2017-04-29 LAB — HEPATIC FUNCTION PANEL
ALBUMIN: 3.5 g/dL (ref 3.5–5.0)
ALT: 12 U/L — ABNORMAL LOW (ref 14–54)
AST: 33 U/L (ref 15–41)
Alkaline Phosphatase: 54 U/L (ref 38–126)
Bilirubin, Direct: 0.3 mg/dL (ref 0.1–0.5)
Indirect Bilirubin: 0.7 mg/dL (ref 0.3–0.9)
TOTAL PROTEIN: 6.8 g/dL (ref 6.5–8.1)
Total Bilirubin: 1 mg/dL (ref 0.3–1.2)

## 2017-04-29 LAB — BASIC METABOLIC PANEL
ANION GAP: 10 (ref 5–15)
BUN: 15 mg/dL (ref 6–20)
CALCIUM: 9 mg/dL (ref 8.9–10.3)
CHLORIDE: 101 mmol/L (ref 101–111)
CO2: 28 mmol/L (ref 22–32)
CREATININE: 1.05 mg/dL — AB (ref 0.44–1.00)
GFR calc non Af Amer: 49 mL/min — ABNORMAL LOW (ref >60)
GFR, EST AFRICAN AMERICAN: 57 mL/min — AB (ref >60)
Glucose, Bld: 88 mg/dL (ref 65–99)
Potassium: 3.6 mmol/L (ref 3.5–5.1)
SODIUM: 139 mmol/L (ref 135–145)

## 2017-04-29 LAB — I-STAT TROPONIN, ED: TROPONIN I, POC: 0 ng/mL (ref 0.00–0.08)

## 2017-04-29 LAB — TROPONIN I
Troponin I: <0.03 ng/mL (ref <0.03)
Troponin I: <0.03 ng/mL (ref <0.03)

## 2017-04-29 LAB — HEMOGLOBIN A1C
HEMOGLOBIN A1C: 5.7 % — AB (ref 4.8–5.6)
MEAN PLASMA GLUCOSE: 116.89 mg/dL

## 2017-04-29 LAB — LIPASE, BLOOD: LIPASE: 31 U/L (ref 11–51)

## 2017-04-29 MED ORDER — ACETAMINOPHEN 325 MG PO TABS
650.0000 mg | ORAL_TABLET | Freq: Four times a day (QID) | ORAL | Status: DC | PRN
  Administered 2017-04-29 – 2017-04-30 (×3): 650 mg via ORAL
  Filled 2017-04-29 (×3): qty 2

## 2017-04-29 MED ORDER — HYDRALAZINE HCL 20 MG/ML IJ SOLN
5.0000 mg | INTRAMUSCULAR | Status: DC | PRN
  Administered 2017-04-29 – 2017-04-30 (×2): 5 mg via INTRAVENOUS
  Filled 2017-04-29 (×2): qty 1

## 2017-04-29 MED ORDER — NITROGLYCERIN 2 % TD OINT
1.0000 [in_us] | TOPICAL_OINTMENT | Freq: Once | TRANSDERMAL | Status: AC
  Administered 2017-04-29: 1 [in_us] via TOPICAL
  Filled 2017-04-29: qty 1

## 2017-04-29 MED ORDER — VENLAFAXINE HCL ER 150 MG PO CP24
150.0000 mg | ORAL_CAPSULE | Freq: Every day | ORAL | Status: DC
  Administered 2017-04-29 – 2017-04-30 (×2): 150 mg via ORAL
  Filled 2017-04-29 (×3): qty 1

## 2017-04-29 MED ORDER — LOSARTAN POTASSIUM 50 MG PO TABS
100.0000 mg | ORAL_TABLET | Freq: Every day | ORAL | Status: DC
  Administered 2017-04-29 – 2017-04-30 (×2): 100 mg via ORAL
  Filled 2017-04-29 (×3): qty 2

## 2017-04-29 MED ORDER — ACETAMINOPHEN 650 MG RE SUPP
650.0000 mg | Freq: Four times a day (QID) | RECTAL | Status: DC | PRN
  Administered 2017-04-29: 650 mg via RECTAL
  Filled 2017-04-29: qty 1

## 2017-04-29 MED ORDER — AMLODIPINE BESYLATE 5 MG PO TABS
5.0000 mg | ORAL_TABLET | Freq: Every day | ORAL | Status: DC
  Administered 2017-04-29: 20:00:00 5 mg via ORAL
  Filled 2017-04-29: qty 1

## 2017-04-29 MED ORDER — DICLOFENAC SODIUM 1 % TD GEL
4.0000 g | Freq: Four times a day (QID) | TRANSDERMAL | Status: DC
  Administered 2017-04-29 – 2017-04-30 (×6): 4 g via TOPICAL
  Filled 2017-04-29: qty 100

## 2017-04-29 MED ORDER — NITROGLYCERIN 0.4 MG SL SUBL
0.4000 mg | SUBLINGUAL_TABLET | SUBLINGUAL | Status: DC | PRN
  Administered 2017-04-29 (×3): 0.4 mg via SUBLINGUAL
  Filled 2017-04-29: qty 1

## 2017-04-29 MED ORDER — SODIUM CHLORIDE 0.9% FLUSH
3.0000 mL | Freq: Two times a day (BID) | INTRAVENOUS | Status: DC
  Administered 2017-04-29 – 2017-04-30 (×3): 3 mL via INTRAVENOUS

## 2017-04-29 MED ORDER — ENOXAPARIN SODIUM 40 MG/0.4ML ~~LOC~~ SOLN
40.0000 mg | SUBCUTANEOUS | Status: DC
  Administered 2017-04-30: 40 mg via SUBCUTANEOUS
  Filled 2017-04-29: qty 0.4

## 2017-04-29 MED ORDER — ARIPIPRAZOLE 10 MG PO TABS
30.0000 mg | ORAL_TABLET | Freq: Every day | ORAL | Status: DC
  Administered 2017-04-29 – 2017-04-30 (×2): 30 mg via ORAL
  Filled 2017-04-29 (×3): qty 3

## 2017-04-29 MED ORDER — ASPIRIN EC 81 MG PO TBEC
81.0000 mg | DELAYED_RELEASE_TABLET | Freq: Every day | ORAL | Status: DC
  Administered 2017-04-30: 81 mg via ORAL
  Filled 2017-04-29: qty 1

## 2017-04-29 NOTE — Progress Notes (Signed)
  Echocardiogram 2D Echocardiogram has been performed.  Kerri Hernandez T Jaiceon Collister 04/29/2017, 1:04 PM

## 2017-04-29 NOTE — ED Notes (Signed)
Sacrum pad placed on patient per pt request

## 2017-04-29 NOTE — Progress Notes (Signed)
Pt is from [facility].    Kerri Hernandez, MSW, [facility] Clinical Social Worker [phone]

## 2017-04-29 NOTE — ED Triage Notes (Signed)
Brought by ems from [facility].  Reports having chest pain in center of chest that started at 4 am.  Worse on palpation.  Given asa 324mg  and Ntg X 3 via nh staff.  Patient reports that pain came down a little bit but now her head hurts.  Hypertensive via ems.  Reports bp of 203/97.  Cbg-105

## 2017-04-29 NOTE — ED Provider Notes (Addendum)
[facility] Provider Note   CSN: [id number] Arrival date & time: 04/29/17  9628     History   Chief Complaint Chief Complaint  Patient presents with  . Chest Pain    HPI Kerri Hernandez is a 81 y.o. female.  HPI   81 year old female with past medical history of hypertension, stroke, arthritis, who presents with chest pain.  The patient states she was awoken from sleep at around 4:56 AM this morning with a dull, aching, pressure-like chest pain.  She states she feels like someone is sitting or standing on her chest.  She has had associated shortness of breath.  No nausea or diaphoresis.  She was reportedly hypertensive at her nursing facility.  EMS was called and she was given nitroglycerin, which did improve her pain.  Her blood pressure improved as well.  She currently endorses 5 out of 6 aching, pressure-like pain in her chest.  It does not radiate.  She denies any lower extremity swelling.  Denies any recent weight gain.  No abdominal pain, nausea, vomiting, or diarrhea.  Other than nitroglycerin, she does not know any alleviating factors.  No specific aggravating factors.  She denies known history of coronary disease.  Past Medical History:  Diagnosis Date  . Arthritis   . Asthma   . Cancer ([place])   . Hypertension   . Stroke [facility])     Patient Active Problem List   Diagnosis Date Noted  . Chest pain 04/29/2017  . Hypertension 04/29/2017    Past Surgical History:  Procedure Laterality Date  . ABDOMINAL HYSTERECTOMY    . MASTECTOMY      OB History    No data available       Home Medications    Prior to Admission medications   Medication Sig Start Date End Date Taking? Authorizing Provider  acetaminophen (TYLENOL) 325 MG tablet Take 650 mg every 4 (four) hours as needed by mouth for mild pain.    Yes [provider]  ARIPiprazole (ABILIFY) 30 MG tablet Take 30 mg by mouth daily.   Yes [provider]    ferrous sulfate 325 (65 FE) MG tablet Take 325 mg by mouth daily.   Yes [provider]  furosemide (LASIX) 20 MG tablet Take 20 mg daily by mouth.   Yes [provider]  losartan (COZAAR) 100 MG tablet Take 100 mg by mouth daily.   Yes [provider]  pantoprazole (PROTONIX) 40 MG tablet Take 40 mg by mouth daily.   Yes [provider]  potassium chloride SA (K-DUR,KLOR-CON) 20 MEQ tablet Take 1 tablet (20 mEq total) by mouth daily. 04/17/17  Yes Drenda Freeze, MD  venlafaxine XR (EFFEXOR-XR) 150 MG 24 hr capsule Take 150 mg by mouth daily with breakfast.   Yes [provider]  Cholecalciferol (VITAMIN D PO) Take 1 tablet by mouth daily.    [provider]  fluticasone (FLONASE) 50 MCG/ACT nasal spray Place 2 sprays into both nostrils daily.     [provider]  Multiple Vitamin (MULTIVITAMIN WITH MINERALS) TABS tablet Take 1 tablet by mouth daily.    [provider]  potassium chloride SA (K-DUR,KLOR-CON) 20 MEQ tablet Take 1 tablet (20 mEq total) by mouth 2 (two) times daily. Patient not taking: Reported on 36/62/9476 5/46/50   Delora Fuel, MD  Vitamins C E (VITAMIN C & E COMPLEX PO) Take 1 tablet by mouth daily.    [provider]  Family History Family History  Problem Relation Age of Onset  . Heart attack Father     Social History Social History   Tobacco Use  . Smoking status: Former Research scientist (life sciences)  . Smokeless tobacco: Never Used  . Tobacco comment: As a teenager.  Substance Use Topics  . Alcohol use: No    Comment: Occasional wine in the past  . Drug use: No     Allergies   Influenza vaccines; Aspirin; Penicillins; and Sulfa antibiotics   Review of Systems Review of Systems  Constitutional: Positive for fatigue.  Respiratory: Positive for chest tightness.   Cardiovascular: Positive for chest pain.  All other systems reviewed and are negative.    Physical Exam Updated Vital  Signs BP (!) 171/95   Pulse (!) 52   Temp 98.4 F (36.9 C) (Oral)   Resp 14   Ht 5\' 3"  (1.6 m)   Wt 58.5 kg (129 lb)   SpO2 100%   BMI 22.85 kg/m   Physical Exam  Constitutional: She is oriented to person, place, and time. She appears well-developed and well-nourished. No distress.  HENT:  Head: Normocephalic and atraumatic.  Eyes: Conjunctivae are normal.  Neck: Neck supple.  Cardiovascular: Normal rate, regular rhythm and normal heart sounds. Exam reveals no friction rub.  No murmur heard. Pulmonary/Chest: Effort normal and breath sounds normal. No respiratory distress. She has no wheezes. She has no rales.  Abdominal: She exhibits no distension.  Musculoskeletal: She exhibits no edema.  Likely stage II decubitus ulcer in the sacral region.  No erythema or fluctuance.  No drainage.  Neurological: She is alert and oriented to person, place, and time. She exhibits normal muscle tone.  Skin: Skin is warm. Capillary refill takes less than 2 seconds.  Psychiatric: She has a normal mood and affect.  Nursing note and vitals reviewed.    ED Treatments / Results  Labs (all labs ordered are listed, but only abnormal results are displayed) Labs Reviewed  BASIC METABOLIC PANEL - Abnormal; Notable for the following components:      Result Value   Creatinine, Ser 1.05 (*)    GFR calc non Af Amer 49 (*)    GFR calc Af Amer 57 (*)    All other components within normal limits  CBC - Abnormal; Notable for the following components:   Hemoglobin 11.0 (*)    HCT 34.5 (*)    All other components within normal limits  HEPATIC FUNCTION PANEL - Abnormal; Notable for the following components:   ALT 12 (*)    All other components within normal limits  LIPASE, BLOOD  TROPONIN I  TROPONIN I  TROPONIN I  HEMOGLOBIN A1C  I-STAT TROPONIN, ED    EKG  EKG Interpretation  Date/Time:  Monday April 29 2017 07:12:17 EST Ventricular Rate:  53 PR Interval:    QRS Duration: 91 QT  Interval:  448 QTC Calculation: 421 R Axis:   66 Text Interpretation:  Sinus rhythm Ventricular premature complex LVH with secondary repolarization abnormality Baseline wander in lead(s) V3 No significant change since last tracing Confirmed by Duffy Bruce [phone]) on 04/29/2017 7:36:29 AM       Radiology Dg Chest 2 View  Result Date: 04/29/2017 CLINICAL DATA:  Acute onset of central chest pain. High blood pressure. EXAM: CHEST  2 VIEW COMPARISON:  Chest radiograph performed 04/17/2017 FINDINGS: The lungs are well-aerated and clear. There is no evidence of focal opacification, pleural effusion or pneumothorax. The heart is mildly enlarged. No  acute osseous abnormalities are seen. IMPRESSION: Mild cardiomegaly.  Lungs remain grossly clear. Electronically Signed   By: Garald Balding M.D.   On: 04/29/2017 06:50    Procedures Procedures (including critical care time)  Medications Ordered in ED Medications  nitroGLYCERIN (NITROSTAT) SL tablet 0.4 mg (0.4 mg Sublingual Given 04/29/17 0905)  enoxaparin (LOVENOX) injection 40 mg (0 mg Subcutaneous Hold 04/29/17 1047)  sodium chloride flush (NS) 0.9 % injection 3 mL (3 mLs Intravenous Given 04/29/17 1028)  acetaminophen (TYLENOL) tablet 650 mg ( Oral See Alternative 04/29/17 1030)    Or  acetaminophen (TYLENOL) suppository 650 mg (650 mg Rectal Given 04/29/17 1030)  aspirin EC tablet 81 mg (not administered)  diclofenac sodium (VOLTAREN) 1 % transdermal gel 4 g (4 g Topical Given 04/29/17 1103)  losartan (COZAAR) tablet 100 mg (not administered)  venlafaxine XR (EFFEXOR-XR) 24 hr capsule 150 mg (not administered)  ARIPiprazole (ABILIFY) tablet 30 mg (not administered)  nitroGLYCERIN (NITROGLYN) 2 % ointment 1 inch (1 inch Topical Given 04/29/17 0929)     Initial Impression / Assessment and Plan / ED Course  I have reviewed the triage vital signs and the nursing notes.  Pertinent labs & imaging results that were available during my care  of the patient were reviewed by me and considered in my medical decision making (see chart for details).     81 year old female here with chest pressure relieved with nitroglycerin.  EKG nonischemic and initial troponin negative but heart score is 5.  Pain resolved with nitro here.  Cannot receive aspirin due to reported history of rash.  Will admit for high risk chest pain evaluation and observation.  Patient in agreement.  Pain is not consistent with dissection.  Chest x-ray is clear without pneumothorax, pneumonia, or other alternative etiology for her pain.  Pain improved with nitroglycerin.  It is now 2 out of 10.  Will Place Nitropaste.  Otherwise, she does complain of sacral pain and has a stage II ulcer.  Will put a pressure dressing and I have notified nursing.  Patient is otherwise remained medically stable.  This note was prepared with assistance of Systems analyst. Occasional wrong-word or sound-a-like substitutions may have occurred due to the inherent limitations of voice recognition software.   Final Clinical Impressions(s) / ED Diagnoses   Final diagnoses:  Other chest pain    ED Discharge Orders    None       Duffy Bruce, MD 04/29/17 2993    Duffy Bruce, MD 04/29/17 [phone]

## 2017-04-29 NOTE — Progress Notes (Signed)
BP remains elevated at 192/81 after giving norvasc over an hour ago. Dr. Berneice Gandy notified and new orders received. Will continue to monitor notify MD as needed.

## 2017-04-29 NOTE — Consult Note (Signed)
Cardiology Consult    Patient ID: Kerri Hernandez; [id number]; [birth year]   Admit date: 04/29/2017 Date of Consult: 04/29/2017  Primary Care Provider: Nicholes Rough, PA-C Primary Cardiologist: New to [facility] - Dr. Debara Pickett   Patient Profile    Kerri Hernandez is a 81 y.o. female with past medical history of HTN, Asthma, and prior TIA (in 2012) who is being seen today for the evaluation of chest pain at the request of Dr. Dareen Piano.   History of Present Illness    Ms. Meixner presented to [facility] ED this morning reporting chest pain which awoke her from sleep at 0200. She experienced a fall last night while getting up to use the restroom and fell on her knees. She then later noted pain along her sternal region which she describes as a pressure. Denies any associated dyspnea, diaphoresis, nausea, or vomiting. She was given SL NTG with some improvement in her symptoms but is still having intermittent pain at this time which is significantly worse with palpation (echocardiogram being performed and she notes pain when the probe is pressed inward on her chest). She denies any orthopnea, PND, or lower extremity edema.   She reports having been told she had CAD in the past but has never undergone a catheterization. Says a stress test was performed in [place] many years ago and this led to her diagnosis of CAD and an enlarged heart. No known history of CHF or cardiac arrhythmias. Reports her father had a heart attack in his 67's. She denies any alcohol or tobacco use.   The patient currently resides at [facility] and says she has been there since 10/2016 for rehabilitation. She currently is only able to walk a few feet per her report due to bilateral knee pain.   Initial labs show WBC of 6.2, Hgb 11.0, platelets 251, K+ 3.6, Na+ 139, and creatinine 1.05. Initial troponin and repeat value negative. CXR shows mild cardiomegaly with no acute abnormalities. EKG shows sinus bradycardia, HR 53, with  LVH.   BP has been elevated to 148/76 - 200/109 while in the ED. She is scheduled to receive her PTA Losartan 100mg  daily.    Past Medical History   Past Medical History:  Diagnosis Date  . Arthritis   . Asthma   . Cancer ([place])   . Hypertension   . TIA (transient ischemic attack)      Allergies:   Allergies  Allergen Reactions  . Influenza Vaccines Other (See Comments)    Arm Swelling  . Aspirin Rash    Reaction to regular aspirin - no reaction to low dose aspirin  . Penicillins Rash    Has patient had a PCN reaction causing immediate rash, facial/tongue/throat swelling, SOB or lightheadedness with hypotension: Yes Has patient had a PCN reaction causing severe rash involving mucus membranes or skin necrosis: No Has patient had a PCN reaction that required hospitalization: Yes Has patient had a PCN reaction occurring within the last 10 years: No If all of the above answers are "NO", then may proceed with Cephalosporin use.  . Sulfa Antibiotics Rash    Home Medications:   Home Medications:  Prior to Admission medications   Medication Sig Start Date End Date Taking? Authorizing Provider  acetaminophen (TYLENOL) 325 MG tablet Take 650 mg every 4 (four) hours as needed by mouth for mild pain.    Yes [provider]  ARIPiprazole (ABILIFY) 30 MG tablet Take 30 mg by mouth daily.   Yes [provider]  ferrous sulfate 325 (65 FE) MG tablet Take 325 mg by mouth daily.   Yes [provider]  furosemide (LASIX) 20 MG tablet Take 20 mg daily by mouth.   Yes [provider]  losartan (COZAAR) 100 MG tablet Take 100 mg by mouth daily.   Yes [provider]  pantoprazole (PROTONIX) 40 MG tablet Take 40 mg by mouth daily.   Yes [provider]  potassium chloride SA (K-DUR,KLOR-CON) 20 MEQ tablet Take 1 tablet (20 mEq total) by mouth daily. 04/17/17  Yes Drenda Freeze, MD  venlafaxine XR (EFFEXOR-XR) 150 MG 24 hr capsule Take  150 mg by mouth daily with breakfast.   Yes [provider]  Cholecalciferol (VITAMIN D PO) Take 1 tablet by mouth daily.    [provider]  fluticasone (FLONASE) 50 MCG/ACT nasal spray Place 2 sprays into both nostrils daily.     [provider]  Multiple Vitamin (MULTIVITAMIN WITH MINERALS) TABS tablet Take 1 tablet by mouth daily.    [provider]  potassium chloride SA (K-DUR,KLOR-CON) 20 MEQ tablet Take 1 tablet (20 mEq total) by mouth 2 (two) times daily. Patient not taking: Reported on 19/62/2297 9/89/21   Delora Fuel, MD  Vitamins C E (VITAMIN C & E COMPLEX PO) Take 1 tablet by mouth daily.    [provider]    Inpatient Medications    Scheduled Meds: . ARIPiprazole  30 mg Oral Daily  . [START ON 04/30/2017] aspirin EC  81 mg Oral Daily  . diclofenac sodium  4 g Topical QID  . enoxaparin (LOVENOX) injection  40 mg Subcutaneous Q24H  . losartan  100 mg Oral Daily  . sodium chloride flush  3 mL Intravenous Q12H  . venlafaxine XR  150 mg Oral Q breakfast   Continuous Infusions:  PRN Meds: acetaminophen **OR** acetaminophen, nitroGLYCERIN  Family History    Family History  Problem Relation Age of Onset  . Heart attack Father     Social History    Social History   Socioeconomic History  . Marital status: Divorced    Spouse name: Not on file  . Number of children: Not on file  . Years of education: Not on file  . Highest education level: Not on file  Social Needs  . Financial resource strain: Not on file  . Food insecurity - worry: Not on file  . Food insecurity - inability: Not on file  . Transportation needs - medical: Not on file  . Transportation needs - non-medical: Not on file  Occupational History  . Not on file  Tobacco Use  . Smoking status: Former Research scientist (life sciences)  . Smokeless tobacco: Never Used  . Tobacco comment: As a teenager.  Substance and Sexual Activity  . Alcohol use: No    Comment: Occasional wine in  the past  . Drug use: No  . Sexual activity: Not on file  Other Topics Concern  . Not on file  Social History Narrative  . Not on file     Review of Systems    General:  No chills, fever, night sweats or weight changes.  Cardiovascular:  No dyspnea on exertion, edema, orthopnea, palpitations, paroxysmal nocturnal dyspnea. Positive for chest pain.  Dermatological: No rash, lesions/masses Respiratory: No cough, dyspnea Urologic: No hematuria, dysuria MSK: Positive for bilateral knee pain.  Abdominal:   No nausea, vomiting, diarrhea, bright red blood per rectum, melena, or hematemesis Neurologic:  No visual changes, wkns, changes in  mental status. All other systems reviewed and are otherwise negative except as noted above.  Physical Exam/Data    Blood pressure (!) 169/80, pulse (!) 48, temperature 98.4 F (36.9 C), temperature source Oral, resp. rate 13, height 5\' 3"  (1.6 m), weight 129 lb (58.5 kg), SpO2 99 %.  General: Pleasant, elderly African American female appearing in NAD Psych: Normal affect. Neuro: Alert and oriented X 3. Moves all extremities spontaneously. HEENT: Normal  Neck: Supple without bruits or JVD. Lungs:  Resp regular and unlabored, CTA without wheezing or rales. Heart: RRR no s3, s4, 2/6 SEM along RUSB. Tender to palpation along sternum.  Abdomen: Soft, non-tender, non-distended, BS + x 4.  Extremities: No clubbing, cyanosis or edema. DP/PT/Radials 2+ and equal bilaterally.   EKG:  The EKG was personally reviewed and demonstrates: Sinus bradycardia, HR 53, with LVH.    Labs/Studies     Relevant CV Studies:  Echocardiogram: Pending  Laboratory Data:  Chemistry Recent Labs  Lab 04/29/17 0635  NA 139  K 3.6  CL 101  CO2 28  GLUCOSE 88  BUN 15  CREATININE 1.05*  CALCIUM 9.0  GFRNONAA 49*  GFRAA 57*  ANIONGAP 10    Recent Labs  Lab 04/29/17 0635  PROT 6.8  ALBUMIN 3.5  AST 33  ALT 12*  ALKPHOS 54  BILITOT 1.0   Hematology Recent  Labs  Lab 04/29/17 0635  WBC 6.2  RBC 3.91  HGB 11.0*  HCT 34.5*  MCV 88.2  MCH 28.1  MCHC 31.9  RDW 15.0  PLT 251   Cardiac Enzymes Recent Labs  Lab 04/29/17 1028  TROPONINI <0.03    Recent Labs  Lab 04/29/17 0641  TROPIPOC 0.00    BNPNo results for input(s): BNP, PROBNP in the last 168 hours.  DDimer No results for input(s): DDIMER in the last 168 hours.  Radiology/Studies:  Dg Chest 2 View  Result Date: 04/29/2017 CLINICAL DATA:  Acute onset of central chest pain. High blood pressure. EXAM: CHEST  2 VIEW COMPARISON:  Chest radiograph performed 04/17/2017 FINDINGS: The lungs are well-aerated and clear. There is no evidence of focal opacification, pleural effusion or pneumothorax. The heart is mildly enlarged. No acute osseous abnormalities are seen. IMPRESSION: Mild cardiomegaly.  Lungs remain grossly clear. Electronically Signed   By: Garald Balding M.D.   On: 04/29/2017 06:50     Assessment & Plan   1. Atypical Chest Pain - the patient developed chest pain at 0200 which started after she experienced a fall. The pain did not wake her from sleep. She denies any associated dyspnea, diaphoresis, nausea, or vomiting. Her pain has persisted since onset and is significantly worse with palpation.  - she is not active at baseline and has a poor functional status. She has been undergoing rehabilitation since 10/2016 but is only able to walk a few feet per her report.  - Initial troponin and repeat value negative. EKG shows sinus bradycardia, HR 53, with LVH. Continue to trend cardiac enzymes. An echocardiogram is pending to assess LV function and wall motion. Overall, her pain seems atypical for a cardiac etiology and most consistent with MSK pain. She does report a history of CAD (although she never had a catheterization or stent placement) and an enlarged heart (which is likely LVH as noted on her EKG in the setting of uncontrolled HTN). If EF is preserved and no WMA are noted,  would not pursue further ischemic evaluation at this time. If EF is reduced, would  favor a NST or Coronary CT as initial assessment.   2. Systolic Ejection Murmur - appreciated along RUSB, consistent with aortic stenosis.  - echocardiogram is pending to assess valve gradients.   3. Accelerated HTN - BP has been elevated to 148/76 - 200/109 while in the ED.  - she has been restarted on PTA Losartan 100mg  daily. Continue to follow and consider the addition of Amlodipine if BP remains elevated. Would avoid BB therapy with her baseline bradycardia.    Signed, Erma Heritage, PA-C 04/29/2017, 1:08 PM Pager: [phone]
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
## 2017-04-30 DIAGNOSIS — Z79899 Other long term (current) drug therapy: Secondary | ICD-10-CM | POA: Diagnosis not present

## 2017-04-30 DIAGNOSIS — L8990 Pressure ulcer of unspecified site, unspecified stage: Secondary | ICD-10-CM | POA: Diagnosis not present

## 2017-04-30 DIAGNOSIS — Z882 Allergy status to sulfonamides status: Secondary | ICD-10-CM | POA: Diagnosis not present

## 2017-04-30 DIAGNOSIS — E876 Hypokalemia: Secondary | ICD-10-CM

## 2017-04-30 DIAGNOSIS — Z887 Allergy status to serum and vaccine status: Secondary | ICD-10-CM | POA: Diagnosis not present

## 2017-04-30 DIAGNOSIS — L0591 Pilonidal cyst without abscess: Secondary | ICD-10-CM

## 2017-04-30 DIAGNOSIS — Z886 Allergy status to analgesic agent status: Secondary | ICD-10-CM | POA: Diagnosis not present

## 2017-04-30 DIAGNOSIS — R0789 Other chest pain: Principal | ICD-10-CM

## 2017-04-30 DIAGNOSIS — I110 Hypertensive heart disease with heart failure: Secondary | ICD-10-CM | POA: Diagnosis not present

## 2017-04-30 DIAGNOSIS — M25562 Pain in left knee: Secondary | ICD-10-CM | POA: Diagnosis not present

## 2017-04-30 DIAGNOSIS — Z87891 Personal history of nicotine dependence: Secondary | ICD-10-CM | POA: Diagnosis not present

## 2017-04-30 DIAGNOSIS — M25561 Pain in right knee: Secondary | ICD-10-CM | POA: Diagnosis not present

## 2017-04-30 DIAGNOSIS — Z880 Allergy status to penicillin: Secondary | ICD-10-CM | POA: Diagnosis not present

## 2017-04-30 DIAGNOSIS — R011 Cardiac murmur, unspecified: Secondary | ICD-10-CM | POA: Diagnosis not present

## 2017-04-30 DIAGNOSIS — R4182 Altered mental status, unspecified: Secondary | ICD-10-CM | POA: Diagnosis not present

## 2017-04-30 DIAGNOSIS — I10 Essential (primary) hypertension: Secondary | ICD-10-CM

## 2017-04-30 DIAGNOSIS — Z8249 Family history of ischemic heart disease and other diseases of the circulatory system: Secondary | ICD-10-CM | POA: Diagnosis not present

## 2017-04-30 DIAGNOSIS — M069 Rheumatoid arthritis, unspecified: Secondary | ICD-10-CM | POA: Diagnosis not present

## 2017-04-30 DIAGNOSIS — I509 Heart failure, unspecified: Secondary | ICD-10-CM | POA: Diagnosis not present

## 2017-04-30 LAB — BASIC METABOLIC PANEL
Anion gap: 10 (ref 5–15)
BUN: 16 mg/dL (ref 6–20)
CO2: 29 mmol/L (ref 22–32)
CREATININE: 1.03 mg/dL — AB (ref 0.44–1.00)
Calcium: 9.1 mg/dL (ref 8.9–10.3)
Chloride: 99 mmol/L — ABNORMAL LOW (ref 101–111)
GFR calc Af Amer: 58 mL/min — ABNORMAL LOW (ref >60)
GFR, EST NON AFRICAN AMERICAN: 50 mL/min — AB (ref >60)
Glucose, Bld: 82 mg/dL (ref 65–99)
POTASSIUM: 3 mmol/L — AB (ref 3.5–5.1)
SODIUM: 138 mmol/L (ref 135–145)

## 2017-04-30 LAB — CBC
HEMATOCRIT: 34.8 % — AB (ref 36.0–46.0)
Hemoglobin: 11.1 g/dL — ABNORMAL LOW (ref 12.0–15.0)
MCH: 28.2 pg (ref 26.0–34.0)
MCHC: 31.9 g/dL (ref 30.0–36.0)
MCV: 88.3 fL (ref 78.0–100.0)
PLATELETS: 264 10*3/uL (ref 150–400)
RBC: 3.94 MIL/uL (ref 3.87–5.11)
RDW: 15.1 % (ref 11.5–15.5)
WBC: 6.4 10*3/uL (ref 4.0–10.5)

## 2017-04-30 LAB — LIPID PANEL
CHOLESTEROL: 219 mg/dL — AB (ref 0–200)
HDL: 101 mg/dL (ref >40)
LDL Cholesterol: 109 mg/dL — ABNORMAL HIGH (ref 0–99)
Total CHOL/HDL Ratio: 2.2 RATIO
Triglycerides: 44 mg/dL (ref <150)
VLDL: 9 mg/dL (ref 0–40)

## 2017-04-30 LAB — MRSA PCR SCREENING: MRSA BY PCR: NEGATIVE

## 2017-04-30 MED ORDER — AMLODIPINE BESYLATE 10 MG PO TABS: 10.0000 mg | ORAL_TABLET | Freq: Every day | ORAL | 1 refills | Status: AC

## 2017-04-30 MED ORDER — AMLODIPINE BESYLATE 10 MG PO TABS
10.0000 mg | ORAL_TABLET | Freq: Every day | ORAL | Status: DC
  Administered 2017-04-30: 10 mg via ORAL
  Filled 2017-04-30: qty 1

## 2017-04-30 MED ORDER — POTASSIUM CHLORIDE CRYS ER 10 MEQ PO TBCR
30.0000 meq | EXTENDED_RELEASE_TABLET | Freq: Two times a day (BID) | ORAL | Status: AC
  Administered 2017-04-30 (×2): 30 meq via ORAL
  Filled 2017-04-30 (×2): qty 1

## 2017-04-30 MED ORDER — FLUTICASONE PROPIONATE 50 MCG/ACT NA SUSP
2.0000 | Freq: Every day | NASAL | Status: DC
  Administered 2017-04-30: 18:00:00 2 via NASAL
  Filled 2017-04-30: qty 16

## 2017-04-30 NOTE — Discharge Instructions (Addendum)
It was a pleasure taking care of you. Please follow up with your heart doctor and your primary care doctor

## 2017-04-30 NOTE — Plan of Care (Signed)
  Acute Rehab PT Goals(only PT should resolve) Pt Will Go Supine/Side To Sit 04/30/2017 1208 by Denice Paradise, PT Flowsheets Taken 04/30/2017 1208  Pt will go Supine/Side to Sit with modified independence Patient Will Transfer Sit To/From Stand 04/30/2017 1208 by Denice Paradise, PT Flowsheets Taken 04/30/2017 1208  Patient will transfer sit to/from stand with supervision Pt Will Ambulate 04/30/2017 1208 by Denice Paradise, PT Flowsheets Taken 04/30/2017 1208  Pt will Ambulate with min guard assist;> 125 feet;with least restrictive assistive device  [facility] Acute Rehabilitation [phone] (pager)

## 2017-04-30 NOTE — Progress Notes (Signed)
  Date: 04/30/2017  Patient name: Dorethy Tomey  Medical record number: [id number]  Date of birth: 1935-10-06   I have seen and evaluated Williemae Area and discussed their care with the Residency Team.  In brief, patient is a 81 year old female with past medical history of hypertension, rheumatoid arthritis, questionable TIA who presented with chest pain x1 day.  Patient developed sudden onset left-sided chest pain yesterday which is pressure-like and radiated down her left arm and was associated with diaphoresis and palpitations.  No nausea or vomiting, no abdominal pain, no lightheadedness, syncope, no focal weakness, no fevers or chills, no recent URI symptoms, no shortness of breath.  Patient states that she did have some improvement with chest pain sublingual nitroglycerin.   Today patient feels well with no further episodes of chest pain.  PMHx, Fam Hx, and/or Soc Hx : As per resident admit note  Vitals:   04/30/17 1100 04/30/17 1429  BP: (!) 189/86 131/69  Pulse: 83 71  Resp: 17 14  Temp: 98 F (36.7 C) 98.9 F (37.2 C)  SpO2: 97% 97%   General: Awake alert and oriented x3, NAD CVS: Regular rate and rhythm, 3/6 systolic murmur noted  Lungs: CTA bilaterally Abdomen: Soft, nontender, nondistended, normoactive bowel sounds Extremities: No edema noted  Assessment and Plan: I have seen and evaluated the patient as outlined above. I agree with the formulated Assessment and Plan as detailed in the residents' note, with the following changes:   1.  Chest pain: -Patient presented to the ED with new onset chest pain times 1 day which is pressure-like and improved with nitroglycerin in the setting of uncontrolled hypertension.  EKG with no acute ST/T wave changes and patient with normal troponins x3.  -Cardiology follow-up and recommendations appreciated. -Echo showed normal LVEF with no regional wall motion abnormalities -No further cardiac workup at this time -Blood pressure  now better controlled with the addition of amlodipine to her home losartan -No further workup at this time -Patient stable for discharge to SNF today  Aldine Contes, MD 11/13/20183:07 PM

## 2017-04-30 NOTE — Discharge Summary (Signed)
Name: Kerri Hernandez MRN: [id number] DOB: 11/30/1935 81 y.o. PCP: Nicholes Rough, PA-C  Date of Admission: 04/29/2017  6:14 AM Date of Discharge: 04/30/2017 Attending Physician: Aldine Contes, MD  Discharge Diagnosis: 1. Noncardiac chest pain  Principal Problem:   Chest pain Active Problems:   Hypertension   Pressure injury of skin   Discharge Medications: Allergies as of 04/30/2017      Reactions   Influenza Vaccines Other (See Comments)   Arm Swelling   Aspirin Rash   Reaction to regular aspirin - no reaction to low dose aspirin   Penicillins Rash   Has patient had a PCN reaction causing immediate rash, facial/tongue/throat swelling, SOB or lightheadedness with hypotension: Yes Has patient had a PCN reaction causing severe rash involving mucus membranes or skin necrosis: No Has patient had a PCN reaction that required hospitalization: Yes Has patient had a PCN reaction occurring within the last 10 years: No If all of the above answers are "NO", then may proceed with Cephalosporin use.   Sulfa Antibiotics Rash      Medication List    TAKE these medications   acetaminophen 325 MG tablet Commonly known as:  TYLENOL Take 650 mg every 4 (four) hours as needed by mouth for mild pain.   amLODipine 10 MG tablet Commonly known as:  NORVASC Take 1 tablet (10 mg total) daily by mouth. Start taking on:  05/01/2017   ARIPiprazole 30 MG tablet Commonly known as:  ABILIFY Take 30 mg by mouth daily.   ferrous sulfate 325 (65 FE) MG tablet Take 325 mg by mouth daily.   fluticasone 50 MCG/ACT nasal spray Commonly known as:  FLONASE Place 2 sprays into both nostrils daily.   furosemide 20 MG tablet Commonly known as:  LASIX Take 20 mg daily by mouth.   losartan 100 MG tablet Commonly known as:  COZAAR Take 100 mg by mouth daily.   multivitamin with minerals Tabs tablet Take 1 tablet by mouth daily.   pantoprazole 40 MG tablet Commonly known as:  PROTONIX Take  40 mg by mouth daily.   potassium chloride SA 20 MEQ tablet Commonly known as:  K-DUR,KLOR-CON Take 1 tablet (20 mEq total) by mouth 2 (two) times daily. What changed:  Another medication with the same name was removed. Continue taking this medication, and follow the directions you see here.   venlafaxine XR 150 MG 24 hr capsule Commonly known as:  EFFEXOR-XR Take 150 mg by mouth daily with breakfast.   VITAMIN C & E COMPLEX PO Take 1 tablet by mouth daily.   VITAMIN D PO Take 1 tablet by mouth daily.       Disposition and follow-up:   Kerri Hernandez was discharged from [facility] in Good condition.  At the hospital follow up visit please address:  1.  HTN- assess her blood pressure and compliance with the regimen of losartan and amlodipine. Cardiology recommended avoiding beta blocker due to bradycardia  Hypokalemia: ensure compliance with the potassium supplementation and repeat BMET as she has historyof hypokalemia  2.  Labs / imaging needed at time of follow-up: BMET  3.  Pending labs/ test needing follow-up:   Follow-up Appointments: Follow-up Information    Nicholes Rough, PA-C. Schedule an appointment as soon as possible for a visit in 1 week(s).   Specialty:  Physician Assistant Contact information: [place] [address] [phone]           Hospital Course by problem  list: Principal Problem:   Chest pain Active Problems:   Hypertension   Pressure injury of skin   Chest Pain:  New chest pain suggestive of unstable angina, but seen by cardiology and  since the patient did not have any wall motion abnormalities or decreased EF they opted for no further ischemic evaluation.  Cardiology thought that the chest pain was likely musculoskeletal in etiology as she had some chest wall tenderness on their exam.  They recommended better control of her hypertension to prevent further chest discomfort. Chest pain may be due to  elevated blood pressures. Troponins were negative. A1c was 5.7 and LDL of 100. BP better control after starting amlodipine   HTN: Hypertensive on arrival with improvement from nitroglycerin. Resumed home losartan and started amlodipine and dose was increased to 10 mg on discharge and blood pressure improve. She is to follow up with her PCP.   Pilonidal cyst: The patient has a tender pilonidal cyst close to her buttock. Apply heat to affected area- and follow up outpatient.     Discharge Vitals:   BP 131/69 (BP Location: Left Arm)   Pulse 71   Temp 98.9 F (37.2 C) (Oral)   Resp 14   Ht 5\' 3"  (1.6 m)   Wt 124 lb 12.5 oz (56.6 kg)   SpO2 97%   BMI 22.10 kg/m   Pertinent Labs, Studies, and Procedures:  Troponins negative Echo no wall motion abnormalities  Discharge Instructions: Discharge Instructions    Diet - low sodium heart healthy   Complete by:  As directed    Increase activity slowly   Complete by:  As directed       Signed: Burgess Estelle, MD 04/30/2017, 4:38 PM

## 2017-04-30 NOTE — Evaluation (Signed)
Occupational Therapy Evaluation Patient Details Name: Kerri Hernandez MRN: [id number] DOB: Oct 18, 1935 Today's Date: 04/30/2017    History of Present Illness Pt admitted with chest pain from her home at [facility]. PMH: HTN, RA, CHF, ?TIA.   Clinical Impression   Pt was assisted for bathing, dressing, all IADL and walked with a rollator at her SNF prior to admission. She reports she is active with OT at the facility. No family available to offer information about pt's cognition, pt with memory deficits. Pt will return to SNF upon d/c, will defer further OT to SNF.    Follow Up Recommendations  SNF;Supervision/Assistance - 24 hour    Equipment Recommendations       Recommendations for Other Services       Precautions / Restrictions Precautions Precautions: Fall      Mobility Bed Mobility               General bed mobility comments: pt in chair  Transfers Overall transfer level: Needs assistance Equipment used: Rolling walker (2 wheeled) Transfers: Sit to/from Stand Sit to Stand: Mod assist;Min assist         General transfer comment: cues for hand placement, assist to rise and shift weight over feet    Balance Overall balance assessment: Needs assistance   Sitting balance-Leahy Scale: Fair       Standing balance-Leahy Scale: Poor Standing balance comment: able to release walker in static standing at sink and to manage pants                           ADL either performed or assessed with clinical judgement   ADL Overall ADL's : At baseline                                       General ADL Comments: Pt performed UB dressing with set up. Total assist for LB dressing. Stood at sink with supervision and brushed teeth and toileted with max assist for pericare.     Vision Baseline Vision/History: Wears glasses Wears Glasses: At all times Patient Visual Report: No change from baseline       Perception      Praxis      Pertinent Vitals/Pain Pain Assessment: No/denies pain     Hand Dominance Right   Extremity/Trunk Assessment Upper Extremity Assessment Upper Extremity Assessment: Generalized weakness   Lower Extremity Assessment Lower Extremity Assessment: Defer to PT evaluation   Cervical / Trunk Assessment Cervical / Trunk Assessment: Kyphotic   Communication Communication Communication: No difficulties   Cognition Arousal/Alertness: Awake/alert Behavior During Therapy: Flat affect Overall Cognitive Status: No family/caregiver present to determine baseline cognitive functioning                                 General Comments: pt forgetful, has had some paranoia and agitation this hospitalization   General Comments       Exercises     Shoulder Instructions      Home Living Family/patient expects to be discharged to:: Skilled nursing facility Living Arrangements: (recent placement at SNF on 04/18/17)  Prior Functioning/Environment Level of Independence: Needs assistance  Gait / Transfers Assistance Needed: walks with a rollator ADL's / Homemaking Assistance Needed: assisted for bathing and dressing            OT Problem List: Decreased strength;Decreased activity tolerance;Impaired balance (sitting and/or standing);Decreased knowledge of use of DME or AE;Decreased cognition      OT Treatment/Interventions:      OT Goals(Current goals can be found in the care plan section) Acute Rehab OT Goals Patient Stated Goal: to return to [employer]  OT Frequency:     Barriers to D/C:            Co-evaluation              AM-PAC PT "6 Clicks" Daily Activity     Outcome Measure Help from another person eating meals?: None Help from another person taking care of personal grooming?: A Little Help from another person toileting, which includes using toliet, bedpan, or urinal?: A  Little Help from another person bathing (including washing, rinsing, drying)?: A Lot Help from another person to put on and taking off regular upper body clothing?: A Little Help from another person to put on and taking off regular lower body clothing?: A Lot 6 Click Score: 17   End of Session Equipment Utilized During Treatment: Gait belt;Rolling walker  Activity Tolerance: Patient tolerated treatment well Patient left: in chair;with call bell/phone within reach  OT Visit Diagnosis: Unsteadiness on feet (R26.81)                Time: 9794-8016 OT Time Calculation (min): 32 min Charges:  OT General Charges $OT Visit: 1 Visit OT Evaluation $OT Eval Moderate Complexity: 1 Mod OT Treatments $Self Care/Home Management : 8-22 mins G-Codes:     05-02-17 Nestor Lewandowsky, OTR/L Pager: [place], Haze Boyden 2017-05-02, 9:47 AM

## 2017-04-30 NOTE — Progress Notes (Signed)
Patient has been calling out frequently for bedside commode assistance. The nurse tech has responded appropriately to give assistance. Patient has been very pleased with the nurse tech until this last time. She called out and the nurse tech and myself both went into the room. The patient stated, "Nurse, she has been playing with my vagina. She's a lesbian." The nurse tech explained to her that she was wiping her off from front to back to prevent infection after she voided at the bedside commode. The patient stated, "My pastor is coming in the morning and I am going to tell him what happened."  I told her that I was sorry she felt like that happened and that I would be assisting her the rest of the night. She seem to be fine with that. The house supervisor was made aware of the situation.

## 2017-04-30 NOTE — Clinical Social Work Note (Signed)
Patient medically stable for discharge back to [facility] today. Ms. Schuknecht will be transported back to facility by ambulance. CSW advised by admissions director, Santiago Glad that patient is LTC and that patient's Medicaid is in the process of being transferred to LTC Medicaid. Discharge summary transmitted to facility and per admissions director, FL-2 not needed as patient admitted to hospital on 11/12.  Shearon Clonch Givens, MSW, LCSW Licensed Clinical Social Worker [place] [phone]

## 2017-04-30 NOTE — Evaluation (Signed)
Physical Therapy Evaluation Patient Details Name: Kerri Hernandez MRN: [id number] DOB: 08-Nov-1935 Today's Date: 04/30/2017   History of Present Illness  Pt admitted with chest pain from her home at [facility]. PMH: HTN, RA, CHF, ?TIA.  Clinical Impression  Pt admitted with above diagnosis. Pt currently with functional limitations due to the deficits listed below (see PT Problem List). Pt was able to ambulate but needs assist and cues for safety. Continue ac ute PT.   Pt will benefit from skilled PT to increase their independence and safety with mobility to allow discharge to the venue listed below.      Follow Up Recommendations SNF;Supervision/Assistance - 24 hour    Equipment Recommendations  None recommended by PT    Recommendations for Other Services       Precautions / Restrictions Precautions Precautions: Fall Restrictions Weight Bearing Restrictions: No      Mobility  Bed Mobility               General bed mobility comments: pt in chair  Transfers Overall transfer level: Needs assistance Equipment used: Rolling walker (2 wheeled) Transfers: Sit to/from Stand Sit to Stand: Mod assist;Min assist         General transfer comment: cues for hand placement, assist to rise and shift weight over feet  Ambulation/Gait Ambulation/Gait assistance: Min guard Ambulation Distance (Feet): 150 Feet Assistive device: Rolling walker (2 wheeled) Gait Pattern/deviations: Step-through pattern;Decreased stride length;Trunk flexed   Gait velocity interpretation: Below normal speed for age/gender General Gait Details: Pt safe overall with ambulation.  Some cues for RW safety. Pt needs continued therapy to progress.   Stairs            Wheelchair Mobility    Modified Rankin (Stroke Patients Only)       Balance Overall balance assessment: Needs assistance Sitting-balance support: No upper extremity supported;Feet supported Sitting balance-Leahy  Scale: Fair     Standing balance support: Bilateral upper extremity supported;During functional activity Standing balance-Leahy Scale: Poor Standing balance comment: able to release walker in static standing                             Pertinent Vitals/Pain Pain Assessment: No/denies pain  BP 189/86 after treatment with nurse aware.  HR 73-104bpm.   Home Living Family/patient expects to be discharged to:: Skilled nursing facility Living Arrangements: (recent placement at SNF on 04/18/17)                    Prior Function Level of Independence: Needs assistance   Gait / Transfers Assistance Needed: walks with a rollator  ADL's / Homemaking Assistance Needed: assisted for bathing and dressing        Hand Dominance   Dominant Hand: Right    Extremity/Trunk Assessment   Upper Extremity Assessment Upper Extremity Assessment: Defer to OT evaluation    Lower Extremity Assessment Lower Extremity Assessment: Generalized weakness    Cervical / Trunk Assessment Cervical / Trunk Assessment: Kyphotic  Communication   Communication: No difficulties  Cognition Arousal/Alertness: Awake/alert Behavior During Therapy: Flat affect Overall Cognitive Status: No family/caregiver present to determine baseline cognitive functioning                                 General Comments: pt forgetful, has had some paranoia and agitation this hospitalization      General Comments  Exercises     Assessment/Plan    PT Assessment Patient needs continued PT services  PT Problem List Decreased activity tolerance;Decreased balance;Decreased mobility;Decreased knowledge of use of DME;Decreased safety awareness;Decreased knowledge of precautions       PT Treatment Interventions DME instruction;Gait training;Functional mobility training;Therapeutic activities;Therapeutic exercise;Balance training;Patient/family education    PT Goals (Current goals can be  found in the Care Plan section)  Acute Rehab PT Goals Patient Stated Goal: to return to [employer] PT Goal Formulation: With patient Time For Goal Achievement: 05/14/17 Potential to Achieve Goals: Good    Frequency Min 2X/week   Barriers to discharge Decreased caregiver support      Co-evaluation               AM-PAC PT "6 Clicks" Daily Activity  Outcome Measure Difficulty turning over in bed (including adjusting bedclothes, sheets and blankets)?: A Lot Difficulty moving from lying on back to sitting on the side of the bed? : A Lot Difficulty sitting down on and standing up from a chair with arms (e.g., wheelchair, bedside commode, etc,.)?: A Lot Help needed moving to and from a bed to chair (including a wheelchair)?: A Little Help needed walking in hospital room?: A Little Help needed climbing 3-5 steps with a railing? : Total 6 Click Score: 13    End of Session Equipment Utilized During Treatment: Gait belt Activity Tolerance: Patient limited by fatigue Patient left: in chair;with call bell/phone within reach;with chair alarm set Nurse Communication: Mobility status PT Visit Diagnosis: Muscle weakness (generalized) (M62.81)    Time: 1042-1100 PT Time Calculation (min) (ACUTE ONLY): 18 min   Charges:   PT Evaluation $PT Eval Moderate Complexity: 1 Mod     PT G Codes:   PT G-Codes **NOT FOR INPATIENT CLASS** Functional Assessment Tool Used: AM-PAC 6 Clicks Basic Mobility Functional Limitation: Mobility: Walking and moving around Mobility: Walking and Moving Around Current Status (W4315): At least 20 percent but less than 40 percent impaired, limited or restricted Mobility: Walking and Moving Around Goal Status [phone]): At least 1 percent but less than 20 percent impaired, limited or restricted    [place] [phone] (pager)   Denice Paradise 04/30/2017, 12:09 PM

## 2017-04-30 NOTE — Progress Notes (Signed)
   Subjective: Kerri Hernandez was seen sitting in a chair by her bedside.  She states that she is doing well and has not had a episode since her admission.  She denies any shortness of breath, nausea, or vomiting.  The patient states that she was able to eat breakfast.  Objective:  Vital signs in last 24 hours: Vitals:   04/30/17 0620 04/30/17 0700 04/30/17 1100 04/30/17 1429  BP: (!) 170/92 (!) 183/73 (!) 189/86 131/69  Pulse:  72 83 71  Resp: (!) 22 14 17 14   Temp:  98.2 F (36.8 C) 98 F (36.7 C) 98.9 F (37.2 C)  TempSrc:  Oral Oral Oral  SpO2:  97% 97% 97%  Weight:      Height:       Physical Exam  Constitutional: She appears well-developed and well-nourished.  Non-toxic appearance. She does not appear ill.  HENT:  Head: Normocephalic and atraumatic.  Cardiovascular: Normal rate and regular rhythm.  Murmur (Systolic) heard. Heave felt at the left mid clavicular line approximately at the 9th rib  Pulmonary/Chest: Effort normal and breath sounds normal. No accessory muscle usage. No respiratory distress.  Abdominal: Soft. Bowel sounds are normal. She exhibits no distension. There is no tenderness.  Neurological: She is alert.  Psychiatric: She has a normal mood and affect. Her behavior is normal. Her mood appears not anxious. She is not agitated.   Assessment/Plan:  Chest pain: The patient had a new onset left-sided chest pain that was described as dull, achy, pressure-like and radiated down her left arm.  She had associated diaphoresis and palpitations.  This pain was alleviated with use of sublingual and subsequently nitroglycerin paste.  Patient's cardiac history is unknown; she was told she has coronary artery disease and heart failure previously when she was living in [place] but she has never had a cardiac cath that she is aware of.  -Chest x-ray (04/29/2017)-mild cardiomegaly, lungs remain grossly clear -Echocardiogram (04/29/17)-LVEF = 60-65%, severe focal basal and  moderate concentric hypertrophy, no regional wall motion abnormalities, grade 1 diastolic dysfunction, moderate pulmonary hypertension. -Cardiology consulted and since the patient did not have any wall motion abnormalities or decreased EF they opted for no further ischemic evaluation.  Cardiology thought that the chest pain was likely musculoskeletal in etiology as she had some chest wall tenderness on their exam.  They recommended better control of her hypertension to prevent further chest discomfort. -Hemoglobin A1c = 5.7 -Lipid panel: Total cholesterol = 219, triglycerides = 44, HDL = 101, LDL = 100 and -Troponin less than 0.03 x 3 -Hepatic function panel: tprot=6.8, alb=3.5, ast=33, alt=12, tbili=1.0 -Aspirin 81 mg daily -Voltaren gel to the anterior chest -PT and OT recommended SNF  Hypokalemia: The patient's serum this morning (04/30/17) was 3.0. -Repleted potassium 30 mEq twice daily  Pilonidal cyst: The patient has a tender pilonidal cyst close to her buttock.  -apply heat to affected area -incision and drainage possibly as outpatient  Hypertension: Patient's blood pressure over the past 24 hours has ranged 146-210/73-76 -Hydralazine 5 mg every 4 hours as needed SBP >180 -Losartan 100 mill grams daily -Started amlodipine 10 mg daily which will also relax arteries. Consider imdur as an outpatient.   Dispo: Anticipated discharge in approximately 1 day.   Lars Mage, MD Internal Medicine PGY1 Pager:[phone] 04/30/2017, 3:22 PM

## 2017-04-30 NOTE — Care Management Note (Signed)
Case Management Note  Patient Details  Name: Kerri Hernandez MRN: [id number] Date of Birth: 1936/04/18  Subjective/Objective:  From [facility] SNF, presents with chest pain, accelerated HTN.                  Action/Plan: NCM will follow along with CSW for dc needs.  Expected Discharge Date:                  Expected Discharge Plan:  Skilled Nursing Facility  In-House Referral:  Clinical Social Work  Discharge planning Services  CM Consult  Post Acute Care Choice:    Choice offered to:     DME Arranged:    DME Agency:     HH Arranged:    [place] Agency:     Status of Service:  Completed, signed off  If discussed at [employer], dates discussed:    Additional Comments:  Zenon Mayo, RN 04/30/2017, 9:18 AM

## 2017-04-30 NOTE — Care Management Obs Status (Signed)
[place] NOTIFICATION   Patient Details  Name: Kerri Hernandez MRN: [id number] Date of Birth: Dec 22, 1935   Medicare Observation Status Notification Given:  Yes    Zenon Mayo, RN 04/30/2017, 10:30 AM

## 2017-04-30 NOTE — Clinical Social Work Note (Signed)
Clinical Social Work Assessment  Patient Details  Name: Kerri Hernandez MRN: [id number] Date of Birth: [birth year]  Date of referral:  04/30/17               Reason for consult:  Facility Placement(Patient from [facility])                Permission sought to share information with:  Family Supports Permission granted to share information::  Yes, Verbal Permission Granted(Patient gave nursing staff permission to contact family)  Name::     Kerri Hernandez  Agency::     Relationship::  Daughter   Contact Information:  [phone]  Housing/Transportation Living arrangements for the past 2 months:  Skilled Nursing Facility(Per daughter, patient has been at [facility] since 11/1) Source of Information:  Adult Children Patient Interpreter Needed:  None, [place] Criminal Activity/Legal Involvement Pertinent to Current Situation/Hospitalization:    Significant Relationships:  Adult Children Lives with:  Facility Resident([facility]) Do you feel safe going back to the place where you live?  Yes(Daughter in agreement with patient's return to [facility]) Need for family participation in patient care:  Yes (Comment)  Care giving concerns:  Daughter confirmed that patient is from [facility] and will return there at discharge for continued medical care.   Social Worker assessment / plan:  CSW talked with patient's daughter by phone regarding discharge disposition and she confirmed patient's return to [facility]. Per Ms. Markwood, her mother lived with her prior to going to [facility] around the first of November.   Employment status:  Retired Forensic scientist:  Information systems manager, [employer] [place] PT Recommendations:  [place] / Referral to community resources:  Other (Comment Required)(None requested or needed as patient from facility)  Patient/Family's Response to care:  No concerns expressed by daughter regarding patient's care during hospitalization.  Patient/Family's Understanding of and Emotional  Response to Diagnosis, Current Treatment, and Prognosis:  Not discussed.  Emotional Assessment Appearance:  Appears stated age(Looked in on, but did not talk with patient,) Attitude/Demeanor/Rapport:  Unable to Assess Affect (typically observed):  Unable to Assess Orientation:  Oriented to Self, Oriented to Place, Oriented to  Time, Oriented to Situation Alcohol / Substance use:  Tobacco Use, Alcohol Use, Illicit Drugs(Patient reported that she does not smoke, drink or use illicit drugs) Psych involvement (Current and /or in the community):  No (Comment)  Discharge Needs  Concerns to be addressed:  Discharge Planning Concerns Readmission within the last 30 days:  No Current discharge risk:  None Barriers to Discharge:  No Barriers Identified   Sable Feil, LCSW 04/30/2017, 6:36 PM

## 2017-05-01 NOTE — Progress Notes (Signed)
Late entry due to missed g-code.   04-May-2017 1200  OT G-codes **NOT FOR INPATIENT CLASS**  Functional Assessment Tool Used Clinical judgement  Functional Limitation Self care  Self Care Current Status [phone]) CK  Self Care Discharge Status [phone]) CK  04-May-2017 Nestor Lewandowsky, OTR/L Pager: [phone]

## 2017-05-03 DIAGNOSIS — F325 Major depressive disorder, single episode, in full remission: Secondary | ICD-10-CM | POA: Diagnosis not present

## 2017-05-03 DIAGNOSIS — Z0189 Encounter for other specified special examinations: Secondary | ICD-10-CM | POA: Diagnosis not present

## 2017-05-03 DIAGNOSIS — I10 Essential (primary) hypertension: Secondary | ICD-10-CM | POA: Diagnosis not present

## 2017-05-03 DIAGNOSIS — R0782 Intercostal pain: Secondary | ICD-10-CM | POA: Diagnosis not present

## 2017-05-03 DIAGNOSIS — J45909 Unspecified asthma, uncomplicated: Secondary | ICD-10-CM | POA: Diagnosis not present

## 2017-05-06 DIAGNOSIS — F339 Major depressive disorder, recurrent, unspecified: Secondary | ICD-10-CM | POA: Diagnosis not present

## 2017-05-06 DIAGNOSIS — K219 Gastro-esophageal reflux disease without esophagitis: Secondary | ICD-10-CM | POA: Diagnosis not present

## 2017-05-06 DIAGNOSIS — M6281 Muscle weakness (generalized): Secondary | ICD-10-CM | POA: Diagnosis not present

## 2017-05-06 DIAGNOSIS — R262 Difficulty in walking, not elsewhere classified: Secondary | ICD-10-CM | POA: Diagnosis not present

## 2017-05-29 DIAGNOSIS — R600 Localized edema: Secondary | ICD-10-CM | POA: Diagnosis not present

## 2017-05-29 DIAGNOSIS — I5040 Unspecified combined systolic (congestive) and diastolic (congestive) heart failure: Secondary | ICD-10-CM | POA: Diagnosis not present

## 2017-05-29 DIAGNOSIS — R062 Wheezing: Secondary | ICD-10-CM | POA: Diagnosis not present

## 2017-05-29 DIAGNOSIS — R0609 Other forms of dyspnea: Secondary | ICD-10-CM | POA: Diagnosis not present

## 2017-06-06 DIAGNOSIS — F339 Major depressive disorder, recurrent, unspecified: Secondary | ICD-10-CM | POA: Diagnosis not present

## 2017-06-06 DIAGNOSIS — D509 Iron deficiency anemia, unspecified: Secondary | ICD-10-CM | POA: Diagnosis not present

## 2017-06-06 DIAGNOSIS — M6281 Muscle weakness (generalized): Secondary | ICD-10-CM | POA: Diagnosis not present

## 2017-06-06 DIAGNOSIS — J45998 Other asthma: Secondary | ICD-10-CM | POA: Diagnosis not present

## 2017-06-06 DIAGNOSIS — M069 Rheumatoid arthritis, unspecified: Secondary | ICD-10-CM | POA: Diagnosis not present

## 2017-06-06 DIAGNOSIS — I10 Essential (primary) hypertension: Secondary | ICD-10-CM | POA: Diagnosis not present

## 2017-06-06 DIAGNOSIS — I5040 Unspecified combined systolic (congestive) and diastolic (congestive) heart failure: Secondary | ICD-10-CM | POA: Diagnosis not present

## 2017-06-06 DIAGNOSIS — R262 Difficulty in walking, not elsewhere classified: Secondary | ICD-10-CM | POA: Diagnosis not present

## 2017-06-07 DIAGNOSIS — D649 Anemia, unspecified: Secondary | ICD-10-CM | POA: Diagnosis not present

## 2017-06-07 DIAGNOSIS — E119 Type 2 diabetes mellitus without complications: Secondary | ICD-10-CM | POA: Diagnosis not present

## 2017-06-07 DIAGNOSIS — D509 Iron deficiency anemia, unspecified: Secondary | ICD-10-CM | POA: Diagnosis not present

## 2017-06-07 DIAGNOSIS — E785 Hyperlipidemia, unspecified: Secondary | ICD-10-CM | POA: Diagnosis not present

## 2017-06-10 DIAGNOSIS — M6281 Muscle weakness (generalized): Secondary | ICD-10-CM | POA: Diagnosis not present

## 2017-06-10 DIAGNOSIS — M79641 Pain in right hand: Secondary | ICD-10-CM | POA: Diagnosis not present

## 2017-06-10 DIAGNOSIS — D509 Iron deficiency anemia, unspecified: Secondary | ICD-10-CM | POA: Diagnosis not present

## 2017-06-10 DIAGNOSIS — R5383 Other fatigue: Secondary | ICD-10-CM | POA: Diagnosis not present

## 2017-06-13 DIAGNOSIS — K219 Gastro-esophageal reflux disease without esophagitis: Secondary | ICD-10-CM | POA: Diagnosis not present

## 2017-06-13 DIAGNOSIS — R0789 Other chest pain: Secondary | ICD-10-CM | POA: Diagnosis not present

## 2017-06-13 DIAGNOSIS — M6281 Muscle weakness (generalized): Secondary | ICD-10-CM | POA: Diagnosis not present

## 2017-06-13 DIAGNOSIS — R079 Chest pain, unspecified: Secondary | ICD-10-CM | POA: Diagnosis not present

## 2017-06-13 DIAGNOSIS — K30 Functional dyspepsia: Secondary | ICD-10-CM | POA: Diagnosis not present

## 2017-06-13 DIAGNOSIS — D509 Iron deficiency anemia, unspecified: Secondary | ICD-10-CM | POA: Diagnosis not present

## 2017-06-14 DIAGNOSIS — I455 Other specified heart block: Secondary | ICD-10-CM | POA: Diagnosis not present

## 2017-06-14 DIAGNOSIS — Z0189 Encounter for other specified special examinations: Secondary | ICD-10-CM | POA: Diagnosis not present

## 2017-06-14 DIAGNOSIS — K219 Gastro-esophageal reflux disease without esophagitis: Secondary | ICD-10-CM | POA: Diagnosis not present

## 2017-06-14 DIAGNOSIS — R079 Chest pain, unspecified: Secondary | ICD-10-CM | POA: Diagnosis not present

## 2017-06-17 DIAGNOSIS — L603 Nail dystrophy: Secondary | ICD-10-CM | POA: Diagnosis not present

## 2017-06-17 DIAGNOSIS — I739 Peripheral vascular disease, unspecified: Secondary | ICD-10-CM | POA: Diagnosis not present

## 2017-06-17 DIAGNOSIS — B351 Tinea unguium: Secondary | ICD-10-CM | POA: Diagnosis not present

## 2017-06-20 DIAGNOSIS — I200 Unstable angina: Secondary | ICD-10-CM | POA: Diagnosis not present

## 2017-06-20 DIAGNOSIS — K219 Gastro-esophageal reflux disease without esophagitis: Secondary | ICD-10-CM | POA: Diagnosis not present

## 2017-06-20 DIAGNOSIS — M6281 Muscle weakness (generalized): Secondary | ICD-10-CM | POA: Diagnosis not present

## 2017-06-20 DIAGNOSIS — D509 Iron deficiency anemia, unspecified: Secondary | ICD-10-CM | POA: Diagnosis not present

## 2017-06-26 DIAGNOSIS — I119 Hypertensive heart disease without heart failure: Secondary | ICD-10-CM | POA: Diagnosis not present

## 2017-06-26 DIAGNOSIS — C50911 Malignant neoplasm of unspecified site of right female breast: Secondary | ICD-10-CM | POA: Diagnosis not present

## 2017-06-26 DIAGNOSIS — I455 Other specified heart block: Secondary | ICD-10-CM | POA: Diagnosis not present

## 2017-06-26 DIAGNOSIS — I5040 Unspecified combined systolic (congestive) and diastolic (congestive) heart failure: Secondary | ICD-10-CM | POA: Diagnosis not present

## 2017-06-26 DIAGNOSIS — D509 Iron deficiency anemia, unspecified: Secondary | ICD-10-CM | POA: Diagnosis not present

## 2017-06-26 DIAGNOSIS — I639 Cerebral infarction, unspecified: Secondary | ICD-10-CM | POA: Diagnosis not present

## 2017-06-26 DIAGNOSIS — J45909 Unspecified asthma, uncomplicated: Secondary | ICD-10-CM | POA: Diagnosis not present

## 2017-06-26 DIAGNOSIS — K219 Gastro-esophageal reflux disease without esophagitis: Secondary | ICD-10-CM | POA: Diagnosis not present

## 2017-07-02 DIAGNOSIS — I5040 Unspecified combined systolic (congestive) and diastolic (congestive) heart failure: Secondary | ICD-10-CM | POA: Diagnosis not present

## 2017-07-02 DIAGNOSIS — I10 Essential (primary) hypertension: Secondary | ICD-10-CM | POA: Diagnosis not present

## 2017-07-02 DIAGNOSIS — K219 Gastro-esophageal reflux disease without esophagitis: Secondary | ICD-10-CM | POA: Diagnosis not present

## 2017-07-02 DIAGNOSIS — M6281 Muscle weakness (generalized): Secondary | ICD-10-CM | POA: Diagnosis not present

## 2017-07-02 DIAGNOSIS — M069 Rheumatoid arthritis, unspecified: Secondary | ICD-10-CM | POA: Diagnosis not present

## 2017-07-02 DIAGNOSIS — R262 Difficulty in walking, not elsewhere classified: Secondary | ICD-10-CM | POA: Diagnosis not present

## 2017-07-02 DIAGNOSIS — F339 Major depressive disorder, recurrent, unspecified: Secondary | ICD-10-CM | POA: Diagnosis not present

## 2017-07-02 DIAGNOSIS — D509 Iron deficiency anemia, unspecified: Secondary | ICD-10-CM | POA: Diagnosis not present

## 2017-07-03 DIAGNOSIS — I10 Essential (primary) hypertension: Secondary | ICD-10-CM | POA: Diagnosis not present

## 2017-07-03 DIAGNOSIS — R109 Unspecified abdominal pain: Secondary | ICD-10-CM | POA: Diagnosis not present

## 2017-07-03 DIAGNOSIS — M6281 Muscle weakness (generalized): Secondary | ICD-10-CM | POA: Diagnosis not present

## 2017-07-03 DIAGNOSIS — I200 Unstable angina: Secondary | ICD-10-CM | POA: Diagnosis not present

## 2017-07-03 DIAGNOSIS — R52 Pain, unspecified: Secondary | ICD-10-CM | POA: Diagnosis not present

## 2017-07-19 DIAGNOSIS — I5040 Unspecified combined systolic (congestive) and diastolic (congestive) heart failure: Secondary | ICD-10-CM | POA: Diagnosis not present

## 2017-07-19 DIAGNOSIS — M25569 Pain in unspecified knee: Secondary | ICD-10-CM | POA: Diagnosis not present

## 2017-07-19 DIAGNOSIS — J45998 Other asthma: Secondary | ICD-10-CM | POA: Diagnosis not present

## 2017-07-19 DIAGNOSIS — M069 Rheumatoid arthritis, unspecified: Secondary | ICD-10-CM | POA: Diagnosis not present

## 2017-07-19 DIAGNOSIS — K219 Gastro-esophageal reflux disease without esophagitis: Secondary | ICD-10-CM | POA: Diagnosis not present

## 2017-07-19 DIAGNOSIS — D509 Iron deficiency anemia, unspecified: Secondary | ICD-10-CM | POA: Diagnosis not present

## 2017-07-19 DIAGNOSIS — F339 Major depressive disorder, recurrent, unspecified: Secondary | ICD-10-CM | POA: Diagnosis not present

## 2017-07-19 DIAGNOSIS — M6281 Muscle weakness (generalized): Secondary | ICD-10-CM | POA: Diagnosis not present

## 2017-07-19 DIAGNOSIS — R262 Difficulty in walking, not elsewhere classified: Secondary | ICD-10-CM | POA: Diagnosis not present

## 2017-07-19 DIAGNOSIS — I10 Essential (primary) hypertension: Secondary | ICD-10-CM | POA: Diagnosis not present

## 2017-07-19 DIAGNOSIS — I455 Other specified heart block: Secondary | ICD-10-CM | POA: Diagnosis not present

## 2017-07-31 ENCOUNTER — Emergency Department (HOSPITAL_COMMUNITY): Payer: Medicare Other

## 2017-07-31 ENCOUNTER — Encounter (HOSPITAL_COMMUNITY): Payer: Self-pay | Admitting: Emergency Medicine

## 2017-07-31 ENCOUNTER — Other Ambulatory Visit: Payer: Self-pay

## 2017-07-31 ENCOUNTER — Emergency Department (HOSPITAL_COMMUNITY)
Admission: EM | Admit: 2017-07-31 | Discharge: 2017-07-31 | Disposition: A | Discharge status: Home / Self Care | Payer: Medicare Other | Attending: Emergency Medicine | Admitting: Emergency Medicine

## 2017-07-31 DIAGNOSIS — Z8673 Personal history of transient ischemic attack (TIA), and cerebral infarction without residual deficits: Secondary | ICD-10-CM | POA: Diagnosis not present

## 2017-07-31 DIAGNOSIS — I509 Heart failure, unspecified: Secondary | ICD-10-CM | POA: Diagnosis not present

## 2017-07-31 DIAGNOSIS — Z87891 Personal history of nicotine dependence: Secondary | ICD-10-CM | POA: Insufficient documentation

## 2017-07-31 DIAGNOSIS — I110 Hypertensive heart disease with heart failure: Secondary | ICD-10-CM | POA: Diagnosis not present

## 2017-07-31 DIAGNOSIS — Z79899 Other long term (current) drug therapy: Secondary | ICD-10-CM | POA: Diagnosis not present

## 2017-07-31 DIAGNOSIS — J45909 Unspecified asthma, uncomplicated: Secondary | ICD-10-CM | POA: Insufficient documentation

## 2017-07-31 DIAGNOSIS — R072 Precordial pain: Secondary | ICD-10-CM | POA: Diagnosis not present

## 2017-07-31 DIAGNOSIS — B349 Viral infection, unspecified: Secondary | ICD-10-CM | POA: Diagnosis not present

## 2017-07-31 DIAGNOSIS — R079 Chest pain, unspecified: Secondary | ICD-10-CM | POA: Diagnosis not present

## 2017-07-31 LAB — BASIC METABOLIC PANEL
Anion gap: 12 (ref 5–15)
BUN: 13 mg/dL (ref 6–20)
CALCIUM: 8.9 mg/dL (ref 8.9–10.3)
CO2: 25 mmol/L (ref 22–32)
CREATININE: 0.93 mg/dL (ref 0.44–1.00)
Chloride: 102 mmol/L (ref 101–111)
GFR calc Af Amer: >60 mL/min (ref >60)
GFR, EST NON AFRICAN AMERICAN: 57 mL/min — AB (ref >60)
Glucose, Bld: 83 mg/dL (ref 65–99)
POTASSIUM: 3 mmol/L — AB (ref 3.5–5.1)
SODIUM: 139 mmol/L (ref 135–145)

## 2017-07-31 LAB — INFLUENZA PANEL BY PCR (TYPE A & B)
INFLBPCR: NEGATIVE
Influenza A By PCR: NEGATIVE

## 2017-07-31 LAB — I-STAT TROPONIN, ED
TROPONIN I, POC: 0.01 ng/mL (ref 0.00–0.08)
Troponin i, poc: 0.01 ng/mL (ref 0.00–0.08)

## 2017-07-31 LAB — CBC
HEMATOCRIT: 34.1 % — AB (ref 36.0–46.0)
Hemoglobin: 10.7 g/dL — ABNORMAL LOW (ref 12.0–15.0)
MCH: 28 pg (ref 26.0–34.0)
MCHC: 31.4 g/dL (ref 30.0–36.0)
MCV: 89.3 fL (ref 78.0–100.0)
PLATELETS: 204 10*3/uL (ref 150–400)
RBC: 3.82 MIL/uL — ABNORMAL LOW (ref 3.87–5.11)
RDW: 14.9 % (ref 11.5–15.5)
WBC: 6.4 10*3/uL (ref 4.0–10.5)

## 2017-07-31 MED ORDER — POTASSIUM CHLORIDE CRYS ER 20 MEQ PO TBCR: 40.0000 meq | EXTENDED_RELEASE_TABLET | Freq: Once | ORAL | Status: DC

## 2017-07-31 MED ORDER — POTASSIUM CHLORIDE 20 MEQ/15ML (10%) PO SOLN
40.0000 meq | Freq: Once | ORAL | Status: AC
  Administered 2017-07-31: 40 meq via ORAL
  Filled 2017-07-31: qty 30

## 2017-07-31 MED ORDER — ACETAMINOPHEN 500 MG PO TABS: 1000.0000 mg | ORAL_TABLET | Freq: Once | ORAL | Status: DC

## 2017-07-31 MED ORDER — HYDRALAZINE HCL 10 MG PO TABS
10.0000 mg | ORAL_TABLET | Freq: Once | ORAL | Status: DC
  Filled 2017-07-31: qty 1

## 2017-07-31 MED ORDER — ACETAMINOPHEN 500 MG PO TABS: 1000.0000 mg | ORAL_TABLET | Freq: Four times a day (QID) | ORAL | 0 refills | Status: AC | PRN

## 2017-07-31 MED ORDER — LOSARTAN POTASSIUM 50 MG PO TABS
100.0000 mg | ORAL_TABLET | Freq: Once | ORAL | Status: AC
  Administered 2017-07-31: 100 mg via ORAL
  Filled 2017-07-31 (×2): qty 2

## 2017-07-31 MED ORDER — ACETAMINOPHEN 500 MG PO TABS
1000.0000 mg | ORAL_TABLET | Freq: Once | ORAL | Status: AC
  Administered 2017-07-31: 1000 mg via ORAL
  Filled 2017-07-31: qty 2

## 2017-07-31 MED ORDER — HYDRALAZINE HCL 25 MG PO TABS
25.0000 mg | ORAL_TABLET | Freq: Once | ORAL | Status: AC
  Administered 2017-07-31: 25 mg via ORAL
  Filled 2017-07-31: qty 1

## 2017-07-31 MED ORDER — HYDRALAZINE HCL 10 MG PO TABS: 10.0000 mg | ORAL_TABLET | Freq: Three times a day (TID) | ORAL | 0 refills | Status: DC

## 2017-07-31 MED ORDER — AMLODIPINE BESYLATE 5 MG PO TABS
10.0000 mg | ORAL_TABLET | Freq: Once | ORAL | Status: AC
  Administered 2017-07-31: 10 mg via ORAL
  Filled 2017-07-31: qty 2

## 2017-07-31 NOTE — ED Provider Notes (Signed)
[facility] EMERGENCY DEPARTMENT Provider Note   CSN: [id number] Arrival date & time: 07/31/17  3151     History   Chief Complaint Chief Complaint  Patient presents with  . Chest Pain  . Generalized Body Aches    HPI Kerri Hernandez is a 82 y.o. female.  HPI Patient reports that she woke up this morning aching all over.  She reports her arms and her legs hurt.  She was she developed a little bit of a cough.  She also reports her blood pressure was high.  Patient reports that she has a roommate at her assisted living has been coughing a lot and not covering her mouth.  She reports the roommate also spits her sputum into the trash can.  When asked about chest pain, patient reported she had "a little bit" this morning in the center of her chest.  Symptoms seems to be aching of whole body.  She denies shortness of breath.  No nausea no vomiting.  She reports she has been taking her blood pressure medicine but nonetheless her blood pressure is frequently running very high.  She reports her aide yesterday told her that her blood pressure was "100/107". Past Medical History:  Diagnosis Date  . Arthritis   . Asthma   . Cancer ([place])   . CHF (congestive heart failure) ([place])   . Hypertension   . TIA (transient ischemic attack)     Patient Active Problem List   Diagnosis Date Noted  . Pressure injury of skin 04/30/2017  . Chest pain 04/29/2017  . Hypertension 04/29/2017  . Aortic valve regurgitation     Past Surgical History:  Procedure Laterality Date  . ABDOMINAL HYSTERECTOMY    . MASTECTOMY      OB History    No data available       Home Medications    Prior to Admission medications   Medication Sig Start Date End Date Taking? Authorizing Provider  acetaminophen (TYLENOL) 325 MG tablet Take 650 mg every 4 (four) hours as needed by mouth for mild pain.     [provider]  acetaminophen (TYLENOL) 500 MG tablet Take 2 tablets (1,000 mg total)  by mouth every 6 (six) hours as needed. 07/31/17   Charlesetta Shanks, MD  amLODipine (NORVASC) 10 MG tablet Take 1 tablet (10 mg total) daily by mouth. 05/01/17   Burgess Estelle, MD  ARIPiprazole (ABILIFY) 30 MG tablet Take 30 mg by mouth daily.    [provider]  Cholecalciferol (VITAMIN D PO) Take 1 tablet by mouth daily.    [provider]  ferrous sulfate 325 (65 FE) MG tablet Take 325 mg by mouth daily.    [provider]  fluticasone (FLONASE) 50 MCG/ACT nasal spray Place 2 sprays into both nostrils daily.     [provider]  furosemide (LASIX) 20 MG tablet Take 20 mg daily by mouth.    [provider]  hydrALAZINE (APRESOLINE) 10 MG tablet Take 1 tablet (10 mg total) by mouth 3 (three) times daily. To use as needed if patient's blood pressure remains greater than 150/80 when taking regular medications as prescribed. 07/31/17   Charlesetta Shanks, MD  losartan (COZAAR) 100 MG tablet Take 100 mg by mouth daily.    [provider]  Multiple Vitamin (MULTIVITAMIN WITH MINERALS) TABS tablet Take 1 tablet by mouth daily.    [provider]  pantoprazole (PROTONIX) 40 MG tablet Take 40 mg by mouth daily.  [provider]  potassium chloride SA (K-DUR,KLOR-CON) 20 MEQ tablet Take 1 tablet (20 mEq total) by mouth 2 (two) times daily. Patient not taking: Reported on 12/45/8099 8/33/82   Delora Fuel, MD  venlafaxine XR (EFFEXOR-XR) 150 MG 24 hr capsule Take 150 mg by mouth daily with breakfast.    [provider]  Vitamins C E (VITAMIN C & E COMPLEX PO) Take 1 tablet by mouth daily.    [provider]    Family History Family History  Problem Relation Age of Onset  . Heart attack Father     Social History Social History   Tobacco Use  . Smoking status: Former Research scientist (life sciences)  . Smokeless tobacco: Never Used  . Tobacco comment: As a teenager.  Substance Use Topics  . Alcohol use: No    Comment: Occasional wine  in the past  . Drug use: No     Allergies   Influenza vaccines; Aspirin; Penicillins; and Sulfa antibiotics   Review of Systems Review of Systems 10 Systems reviewed and are negative for acute change except as noted in the HPI.   Physical Exam Updated Vital Signs BP (!) 181/93 (BP Location: Right Arm)   Pulse 65   Temp 98.1 F (36.7 C) (Oral)   Resp 20   Ht 5\' 3"  (1.6 m)   Wt 58.5 kg (129 lb)   SpO2 99%   BMI 22.85 kg/m   Physical Exam  Constitutional: She is oriented to person, place, and time.  Patient is alert and nontoxic.  She is clinically well in appearance.  Interactive.  No respiratory distress.  Well-groomed.  HENT:  Head: Normocephalic and atraumatic.  Mouth/Throat: Oropharynx is clear and moist.  Eyes: EOM are normal.  Neck: Neck supple.  Cardiovascular: Normal rate, regular rhythm and intact distal pulses.  2\6 systolic ejection murmur.  Pulmonary/Chest: Effort normal and breath sounds normal.  Abdominal: Soft. She exhibits no distension. There is no tenderness. There is no guarding.  Musculoskeletal:  Calves are soft and nontender.  1+ edema.  Extensive arthritic changes of hands.  Neurological: She is alert and oriented to person, place, and time. No cranial nerve deficit. She exhibits normal muscle tone. Coordination normal.  Skin: Skin is warm and dry.  Psychiatric: She has a normal mood and affect.     ED Treatments / Results  Labs (all labs ordered are listed, but only abnormal results are displayed) Labs Reviewed  BASIC METABOLIC PANEL - Abnormal; Notable for the following components:      Result Value   Potassium 3.0 (*)    GFR calc non Af Amer 57 (*)    All other components within normal limits  CBC - Abnormal; Notable for the following components:   RBC 3.82 (*)    Hemoglobin 10.7 (*)    HCT 34.1 (*)    All other components within normal limits  INFLUENZA PANEL BY PCR (TYPE A & B)  I-STAT TROPONIN, ED  I-STAT TROPONIN, ED  I-STAT  TROPONIN, ED    EKG  EKG Interpretation  Date/Time:  Wednesday July 31 2017 07:25:21 EST Ventricular Rate:  56 PR Interval:    QRS Duration: 127 QT Interval:  411 QTC Calculation: 397 R Axis:   65 Text Interpretation:  extensive tremor artifact. Likely SR. No apparent change compared to previous Confirmed by Charlesetta Shanks [phone]) on 07/31/2017 8:02:54 AM Also confirmed by Charlesetta Shanks [phone]), editor Philomena Doheny [phone])  on 07/31/2017 8:03:47 AM  Radiology Dg Chest 2 View  Result Date: 07/31/2017 CLINICAL DATA:  Chest pain EXAM: CHEST  2 VIEW COMPARISON:  04/29/2017 FINDINGS: Cardiac enlargement with left ventricular dilatation. Mild vascular congestion without heart failure or edema. Lungs are clear without infiltrate or effusion. Negative for pneumonia IMPRESSION: Cardiac enlargement.  No acute abnormality. Electronically Signed   By: Franchot Gallo M.D.   On: 07/31/2017 08:14    Procedures Procedures (including critical care time)  Medications Ordered in ED Medications  amLODipine (NORVASC) tablet 10 mg (10 mg Oral Given 07/31/17 0952)  losartan (COZAAR) tablet 100 mg (100 mg Oral Given 07/31/17 1011)  acetaminophen (TYLENOL) tablet 1,000 mg (1,000 mg Oral Given 07/31/17 0952)  hydrALAZINE (APRESOLINE) tablet 25 mg (25 mg Oral Given 07/31/17 1410)  potassium chloride 20 MEQ/15ML (10%) solution 40 mEq (40 mEq Oral Given 07/31/17 1411)     Initial Impression / Assessment and Plan / ED Course  I have reviewed the triage vital signs and the nursing notes.  Pertinent labs & imaging results that were available during my care of the patient were reviewed by me and considered in my medical decision making (see chart for details).      Final Clinical Impressions(s) / ED Diagnoses   Final diagnoses:  Precordial pain  Viral illness   Patient presents as outlined above.  A significant portion of this may be viral illness.  Patient seemed quite concerned about her  roommate exhibiting signs of being sick.  The patient is stable without signs of acute pneumonia.  She has not had fever in the emergency department.  Other concern was for hypertension.  Patient reports compliance with her medications.  At this time will add as needed hydralazine if patient's blood pressures are not controlled on her regular regimen.  She is counseled to have close follow-up with her primary provider to review her blood pressure and response to medications as well as continue to observe for any evolving signs of infectious illness.  ED Discharge Orders        Ordered    hydrALAZINE (APRESOLINE) 10 MG tablet  3 times daily     07/31/17 1517    acetaminophen (TYLENOL) 500 MG tablet  Every 6 hours PRN     07/31/17 1517       Charlesetta Shanks, MD 07/31/17 [phone]

## 2017-07-31 NOTE — ED Notes (Signed)
Got patient off the bedpan patient is resting with call bell in reach 

## 2017-07-31 NOTE — ED Notes (Signed)
PT taken off of bedpan. Urinated approx 10 cc. Sample at bedside if needed.

## 2017-07-31 NOTE — ED Notes (Signed)
Pt called out to ask for a Education officer, museum. Did not give any details.

## 2017-07-31 NOTE — ED Notes (Signed)
Put patient on bedpan. 

## 2017-07-31 NOTE — ED Notes (Signed)
Placed on  bedpan

## 2017-07-31 NOTE — ED Notes (Signed)
Patient is resting with call bell in reach  

## 2017-07-31 NOTE — Discharge Instructions (Signed)
At this time general symptoms of body aches and cough seem most likely to be virus in nature.  No signs of pneumonia at this time.  Continue to observe for fever, productive cough or worsening symptoms.  Patient does appear to have hypertension despite compliance with her regular medications.  Hydralazine 10 mg 3 times a day may be added if pressures remain greater than 150/80 despite regular medications.  Follow-up with family provider for recheck within the next 3-4 days.

## 2017-07-31 NOTE — ED Notes (Signed)
Pt given Purewick to try.

## 2017-07-31 NOTE — ED Triage Notes (Signed)
Pt BIB EMS from [facility]. Had sudden onset of throbbing, non-radiating CP. Took two nitro from own prescription, without relief. Given two additional nitro from EMS, as well as 324mg  ASA. Hx heart block, angina.

## 2017-08-08 ENCOUNTER — Emergency Department (HOSPITAL_COMMUNITY): Payer: Medicare Other

## 2017-08-08 ENCOUNTER — Emergency Department (HOSPITAL_COMMUNITY)
Admission: EM | Admit: 2017-08-08 | Discharge: 2017-08-09 | Disposition: A | Discharge status: Home / Self Care | Payer: Medicare Other | Attending: Emergency Medicine | Admitting: Emergency Medicine

## 2017-08-08 ENCOUNTER — Other Ambulatory Visit: Payer: Self-pay

## 2017-08-08 DIAGNOSIS — Y9389 Activity, other specified: Secondary | ICD-10-CM | POA: Diagnosis not present

## 2017-08-08 DIAGNOSIS — X58XXXA Exposure to other specified factors, initial encounter: Secondary | ICD-10-CM | POA: Diagnosis not present

## 2017-08-08 DIAGNOSIS — Y92128 Other place in nursing home as the place of occurrence of the external cause: Secondary | ICD-10-CM | POA: Insufficient documentation

## 2017-08-08 DIAGNOSIS — Z87891 Personal history of nicotine dependence: Secondary | ICD-10-CM | POA: Insufficient documentation

## 2017-08-08 DIAGNOSIS — Z79899 Other long term (current) drug therapy: Secondary | ICD-10-CM | POA: Insufficient documentation

## 2017-08-08 DIAGNOSIS — I509 Heart failure, unspecified: Secondary | ICD-10-CM | POA: Diagnosis not present

## 2017-08-08 DIAGNOSIS — Y999 Unspecified external cause status: Secondary | ICD-10-CM | POA: Insufficient documentation

## 2017-08-08 DIAGNOSIS — T17208A Unspecified foreign body in pharynx causing other injury, initial encounter: Secondary | ICD-10-CM

## 2017-08-08 DIAGNOSIS — I10 Essential (primary) hypertension: Secondary | ICD-10-CM | POA: Diagnosis not present

## 2017-08-08 DIAGNOSIS — T17308A Unspecified foreign body in larynx causing other injury, initial encounter: Secondary | ICD-10-CM

## 2017-08-08 DIAGNOSIS — Z853 Personal history of malignant neoplasm of breast: Secondary | ICD-10-CM | POA: Insufficient documentation

## 2017-08-08 DIAGNOSIS — R070 Pain in throat: Secondary | ICD-10-CM | POA: Diagnosis present

## 2017-08-08 DIAGNOSIS — T17328A Food in larynx causing other injury, initial encounter: Secondary | ICD-10-CM | POA: Diagnosis not present

## 2017-08-08 NOTE — ED Provider Notes (Signed)
[facility] DEPT Provider Note   CSN: [id number] Arrival date & time: 08/08/17  2127     History   Chief Complaint Chief Complaint  Patient presents with  . Choking    HPI Kerri Hernandez is a 82 y.o. female.  The history is provided by the patient and medical records. No language interpreter was used.  Foreign Body  The current episode started 6 to 12 hours ago. The foreign body is suspected to be swallowed. Suspected object: bone shard fragmenty. The incident was witnessed. The incident was witnessed/reported by the patient. Associated symptoms include sore throat, trouble swallowing and choking. Pertinent negatives include no fever, no congestion, no drainage, no nosebleeds, no drooling, no difficulty breathing, no cough, no wheezing, no chest pain, no vomiting and no abdominal pain.    Past Medical History:  Diagnosis Date  . Arthritis   . Asthma   . Cancer ([place])   . CHF (congestive heart failure) ([place])   . Hypertension   . TIA (transient ischemic attack)     Patient Active Problem List   Diagnosis Date Noted  . Pressure injury of skin 04/30/2017  . Chest pain 04/29/2017  . Hypertension 04/29/2017  . Aortic valve regurgitation     Past Surgical History:  Procedure Laterality Date  . ABDOMINAL HYSTERECTOMY    . MASTECTOMY      OB History    No data available       Home Medications    Prior to Admission medications   Medication Sig Start Date End Date Taking? Authorizing Provider  acetaminophen (TYLENOL) 325 MG tablet Take 650 mg every 4 (four) hours as needed by mouth for mild pain.     [provider]  acetaminophen (TYLENOL) 500 MG tablet Take 2 tablets (1,000 mg total) by mouth every 6 (six) hours as needed. 07/31/17   Charlesetta Shanks, MD  amLODipine (NORVASC) 10 MG tablet Take 1 tablet (10 mg total) daily by mouth. 05/01/17   Burgess Estelle, MD  ARIPiprazole (ABILIFY) 30 MG tablet Take 30 mg by mouth daily.     [provider]  Cholecalciferol (VITAMIN D PO) Take 1 tablet by mouth daily.    [provider]  ferrous sulfate 325 (65 FE) MG tablet Take 325 mg by mouth daily.    [provider]  fluticasone (FLONASE) 50 MCG/ACT nasal spray Place 2 sprays into both nostrils daily.     [provider]  furosemide (LASIX) 20 MG tablet Take 20 mg daily by mouth.    [provider]  hydrALAZINE (APRESOLINE) 10 MG tablet Take 1 tablet (10 mg total) by mouth 3 (three) times daily. To use as needed if patient's blood pressure remains greater than 150/80 when taking regular medications as prescribed. 07/31/17   Charlesetta Shanks, MD  losartan (COZAAR) 100 MG tablet Take 100 mg by mouth daily.    [provider]  Multiple Vitamin (MULTIVITAMIN WITH MINERALS) TABS tablet Take 1 tablet by mouth daily.    [provider]  pantoprazole (PROTONIX) 40 MG tablet Take 40 mg by mouth daily.    [provider]  potassium chloride SA (K-DUR,KLOR-CON) 20 MEQ tablet Take 1 tablet (20 mEq total) by mouth 2 (two) times daily. Patient not taking: Reported on 23/76/2831 11/02/59   Delora Fuel, MD  venlafaxine XR (EFFEXOR-XR) 150 MG 24 hr capsule Take 150 mg by mouth daily with breakfast.    [provider]  Vitamins C E (VITAMIN C &  E COMPLEX PO) Take 1 tablet by mouth daily.    [provider]    Family History Family History  Problem Relation Age of Onset  . Heart attack Father     Social History Social History   Tobacco Use  . Smoking status: Former Research scientist (life sciences)  . Smokeless tobacco: Never Used  . Tobacco comment: As a teenager.  Substance Use Topics  . Alcohol use: No    Comment: Occasional wine in the past  . Drug use: No     Allergies   Influenza vaccines; Aspirin; Penicillins; and Sulfa antibiotics   Review of Systems Review of Systems  Constitutional: Negative for chills, diaphoresis, fatigue and fever.  HENT: Positive for  sore throat and trouble swallowing. Negative for congestion, drooling, nosebleeds and rhinorrhea.   Eyes: Negative for visual disturbance.  Respiratory: Positive for choking. Negative for cough, chest tightness and wheezing.   Cardiovascular: Negative for chest pain.  Gastrointestinal: Negative for abdominal pain, constipation, diarrhea, nausea and vomiting.  Musculoskeletal: Positive for neck pain. Negative for back pain and neck stiffness.  Skin: Negative for rash and wound.  Neurological: Negative for headaches.  Psychiatric/Behavioral: Negative for agitation.     Physical Exam Updated Vital Signs BP (!) 171/95 (BP Location: Left Arm)   Temp 98.2 F (36.8 C) (Oral)   Resp 18   Ht 5\' 3"  (1.6 m)   Wt 59 kg (130 lb)   SpO2 99%   BMI 23.03 kg/m   Physical Exam  Constitutional: She is oriented to person, place, and time. She appears well-developed and well-nourished. No distress.  HENT:  Head: Normocephalic and atraumatic.  Right Ear: External ear normal.  Left Ear: External ear normal.  Nose: Nose normal.  Mouth/Throat: Oropharynx is clear and moist. No oropharyngeal exudate.  Eyes: Conjunctivae and EOM are normal. Pupils are equal, round, and reactive to light.  Neck: Normal range of motion. Neck supple.  Cardiovascular: Normal rate and intact distal pulses.  No murmur heard. Pulmonary/Chest: Effort normal and breath sounds normal. No respiratory distress. She has no wheezes. She exhibits no tenderness.  Abdominal: Soft. She exhibits no distension. There is no tenderness. There is no guarding.  Musculoskeletal: She exhibits no tenderness.  Lymphadenopathy:    She has no cervical adenopathy.  Neurological: She is alert and oriented to person, place, and time. No sensory deficit. She exhibits normal muscle tone.  Skin: Capillary refill takes less than 2 seconds. No rash noted. She is not diaphoretic. No erythema.  Psychiatric: She has a normal mood and affect.  Nursing note  and vitals reviewed.    ED Treatments / Results  Labs (all labs ordered are listed, but only abnormal results are displayed) Labs Reviewed - No data to display  EKG  EKG Interpretation None       Radiology Ct Soft Tissue Neck Wo Contrast  Result Date: 08/09/2017 CLINICAL DATA:  Pork bones stuck in throat, globus sensation. EXAM: CT NECK WITHOUT CONTRAST TECHNIQUE: Multidetector CT imaging of the neck was performed following the standard protocol without intravenous contrast. COMPARISON:  CT neck December 27, 2016 FINDINGS: PHARYNX AND LARYNX: Curvilinear soft tissue density in central hypopharynx at the free edge of epiglottis. Otherwise negative. Normal larynx. SALIVARY GLANDS: Normal though limited by noncontrast evaluation. THYROID: Stable 13 mm thyroid nodule, below size follow-up recommendation. LYMPH NODES: No lymphadenopathy by CT size criteria. VASCULAR: Mild calcific atherosclerosis carotid bifurcations. LIMITED INTRACRANIAL: Normal. VISUALIZED ORBITS: Normal. MASTOIDS AND VISUALIZED PARANASAL SINUSES: Well-aerated. SKELETON: Nonacute.  Multilevel severe cervical spondylosis. Severe LEFT upper cervical facet arthropathy. Moderate to severe C3-4, severe RIGHT C5-6 and severe bilateral C6-7 neural foraminal narrowing. UPPER CHEST: Lung apices are clear. No superior mediastinal lymphadenopathy. OTHER: None. IMPRESSION: 1. Curvilinear density hypopharynx, possible bone fragment. Recommend direct inspection. Patent airway. 2. Severe C5-6 and C6-7 neural foraminal narrowing. Electronically Signed   By: Elon Alas M.D.   On: 08/09/2017 00:10   Ct Chest Wo Contrast  Result Date: 08/08/2017 CLINICAL DATA:  82 year old female with complain of bone stuck in throat. No difficulty breathing. EXAM: CT CHEST WITHOUT CONTRAST TECHNIQUE: Multidetector CT imaging of the chest was performed following the standard protocol without IV contrast. COMPARISON:  Chest radiograph dated 07/31/2017 FINDINGS:  Evaluation of this exam is limited in the absence of intravenous contrast. Cardiovascular: There is mild to moderate cardiomegaly. Minimal pericardial effusion anteriorly measures 5 mm in thickness. There is mild atherosclerotic calcification of the thoracic aorta. There is hypoattenuation of the cardiac blood pool suggestive of a degree of anemia. Clinical correlation is recommended. There is prominence of the pulmonary trunk and main pulmonary arteries suggestive of underlying pulmonary hypertension. Mediastinum/Nodes: There is no hilar or mediastinal adenopathy. Evaluation of the hilum is limited in the absence of intravenous contrast. No mediastinal fluid collection. The esophagus is grossly unremarkable as visualized. There is no radiopaque foreign object in the esophagus. There is a 15 mm right thyroid hypodense nodule, incompletely evaluated ultrasound may provide better characterization. Lungs/Pleura: There is prominence of the pulmonary vasculature. There is no focal consolidation, pleural effusion, or pneumothorax the central airways are patent. Upper Abdomen: No acute abnormality. Musculoskeletal: There is degenerative changes of the spine. No acute osseous pathology. IMPRESSION: 1. No radiopaque foreign object in the visualized esophagus. 2. Cardiomegaly with mild vascular congestion and findings of pulmonary hypertension. 3. Anemia 4. A 15 mm right thyroid hypodense nodule. Ultrasound may provide better evaluation. Electronically Signed   By: Anner Crete M.D.   On: 08/08/2017 23:52    Procedures Procedures (including critical care time)  Medications Ordered in ED Medications  gi cocktail (Maalox,Lidocaine,Donnatal) (not administered)     Initial Impression / Assessment and Plan / ED Course  I have reviewed the triage vital signs and the nursing notes.  Pertinent labs & imaging results that were available during my care of the patient were reviewed by me and considered in my medical  decision making (see chart for details).     Kerri Hernandez is a 82 y.o. female with a past medical history significant for asthma, hypertension, CHF, and prior TIA who presents with choking.  Patient reports that she had a history of a fishbone stuck in her throat back in her 38s but has not had difficulty swallowing ever since.  She says that tonight she was eating a bone in pork chop with her hands when she bit down taking a bite and bit the bone.  She reports that the bones shattered and the fragment shard of bone went down her throat.  She reports it felt like it got stuck and was causing sharp pain in her mid throat.  She reports that the pain has slightly moved into the bottom of her throat but is still present.  She reports that she has been spitting up some blood but is having no difficulty breathing and is also able to tolerate fluids.  She has not tried to eat food.  She denies nausea vomiting, fevers, chills, chest pain, or other complaints.  She  denies recent trauma.  She has no other preceding symptoms.  On exam, patient had no stridor.  Oropharyngeal exam did not appear abnormal.  Patient had no bleeding seen on initial exam.  Patient's lungs were clear.  Normal neck range of motion.  Exam otherwise unremarkable.  Due to the concern for a bony fragment lodged somewhere between her esophagus and her torso, patient will have CT without contrast of the neck and chest.  If bone is discovered, anticipate consultation with GI for bone removal.  Patient is otherwise resting calmly.  Anticipate reassessment after imaging.  12:06 AM CT scan showed curvilinear foreign body that is likely the bone fragment in the hypopharynx.  It appears to be near the epiglottis.   Due to the large size of the bony fragment and the proximity to the epiglottis, otolaryngology will be called.  12:32 AM  Otolaryngology responded and felt that due to the patient's stability for nearly 12 hours, stable vital signs,  and minimal pain, patient would be safe for discharge home with plans to see them in clinic tomorrow for likely removal of the bone.  Patient was given a sip of water which she was able to tolerate without spitting up, difficulty swallowing, or choking.  Patient agreed with a plan as well as strict return precautions for any new or worsening symptoms after discharge tonight.  Patient will call the urology offices and see them in clinic tomorrow at 9 AM.  Patient had no other questions or concerns and was discharged in good condition.    Final Clinical Impressions(s) / ED Diagnoses   Final diagnoses:  Foreign body in hypopharynx, initial encounter  Choking, initial encounter    ED Discharge Orders    None      Clinical Impression: 1. Foreign body in hypopharynx, initial encounter   2. Choking, initial encounter     Disposition: Discharge  Condition: Good  I have discussed the results, Dx and Tx plan with the pt(& family if present). He/she/they expressed understanding and agree(s) with the plan. Discharge instructions discussed at great length. Strict return precautions discussed and pt &/or family have verbalized understanding of the instructions. No further questions at time of discharge.    Discharge Medication List as of 08/09/2017 12:36 AM      Follow Up: Jerrell Belfast, [address] [place] [address] [phone]  Today Please call in the morning to get an appointment to get the bone removed from your throat.     Hanifah Royse, Gwenyth Allegra, MD 08/09/17 [phone]

## 2017-08-08 NOTE — ED Triage Notes (Signed)
Pt to ed via [facility] with complaint of a pork bone stuck in her throat. Pt is not choking or trouble breathing, Sp02 is 98%, non labored, chest symmetrical but "says she can feel something there and can touch it when she sticks her finger down throat"  Vitals bp 156/80 Hr 88 RR 18 CBG 123

## 2017-08-08 NOTE — ED Notes (Signed)
Bed: QQ59 Expected date:  Expected time:  Means of arrival:  Comments: EMS female choking sensation after eating pork chop at lunch

## 2017-08-09 ENCOUNTER — Encounter (HOSPITAL_COMMUNITY)
Admission: RE | Disposition: A | Discharge status: Home / Self Care | Payer: Self-pay | Source: Ambulatory Visit | Attending: Otolaryngology

## 2017-08-09 ENCOUNTER — Ambulatory Visit (HOSPITAL_COMMUNITY): Payer: Medicare Other | Admitting: Anesthesiology

## 2017-08-09 ENCOUNTER — Encounter (HOSPITAL_COMMUNITY): Payer: Self-pay | Admitting: *Deleted

## 2017-08-09 ENCOUNTER — Other Ambulatory Visit: Payer: Self-pay

## 2017-08-09 ENCOUNTER — Observation Stay (HOSPITAL_COMMUNITY)
Admission: RE | Admit: 2017-08-09 | Discharge: 2017-08-10 | Disposition: A | Discharge status: Home / Self Care | Payer: Medicare Other | Source: Ambulatory Visit | Attending: Otolaryngology | Admitting: Otolaryngology

## 2017-08-09 DIAGNOSIS — Z9010 Acquired absence of unspecified breast and nipple: Secondary | ICD-10-CM | POA: Insufficient documentation

## 2017-08-09 DIAGNOSIS — Z79899 Other long term (current) drug therapy: Secondary | ICD-10-CM | POA: Insufficient documentation

## 2017-08-09 DIAGNOSIS — T17208A Unspecified foreign body in pharynx causing other injury, initial encounter: Secondary | ICD-10-CM | POA: Diagnosis present

## 2017-08-09 DIAGNOSIS — Z87891 Personal history of nicotine dependence: Secondary | ICD-10-CM | POA: Diagnosis not present

## 2017-08-09 DIAGNOSIS — I509 Heart failure, unspecified: Secondary | ICD-10-CM | POA: Insufficient documentation

## 2017-08-09 DIAGNOSIS — Z7951 Long term (current) use of inhaled steroids: Secondary | ICD-10-CM | POA: Insufficient documentation

## 2017-08-09 DIAGNOSIS — Z8673 Personal history of transient ischemic attack (TIA), and cerebral infarction without residual deficits: Secondary | ICD-10-CM | POA: Insufficient documentation

## 2017-08-09 DIAGNOSIS — I110 Hypertensive heart disease with heart failure: Secondary | ICD-10-CM | POA: Diagnosis not present

## 2017-08-09 DIAGNOSIS — D649 Anemia, unspecified: Secondary | ICD-10-CM | POA: Insufficient documentation

## 2017-08-09 DIAGNOSIS — T17200A Unspecified foreign body in pharynx causing asphyxiation, initial encounter: Secondary | ICD-10-CM | POA: Diagnosis not present

## 2017-08-09 DIAGNOSIS — X58XXXA Exposure to other specified factors, initial encounter: Secondary | ICD-10-CM | POA: Diagnosis not present

## 2017-08-09 DIAGNOSIS — T17320A Food in larynx causing asphyxiation, initial encounter: Secondary | ICD-10-CM | POA: Diagnosis not present

## 2017-08-09 HISTORY — PX: DIRECT LARYNGOSCOPY: SHX5326

## 2017-08-09 SURGERY — LARYNGOSCOPY, DIRECT
Anesthesia: General | Site: Esophagus

## 2017-08-09 MED ORDER — AMLODIPINE BESYLATE 10 MG PO TABS
10.0000 mg | ORAL_TABLET | Freq: Every day | ORAL | Status: DC
  Administered 2017-08-09 – 2017-08-10 (×2): 10 mg via ORAL
  Filled 2017-08-09 (×2): qty 1

## 2017-08-09 MED ORDER — PANTOPRAZOLE SODIUM 40 MG PO TBEC
40.0000 mg | DELAYED_RELEASE_TABLET | Freq: Every day | ORAL | Status: DC
  Administered 2017-08-09 – 2017-08-10 (×2): 40 mg via ORAL
  Filled 2017-08-09 (×2): qty 1

## 2017-08-09 MED ORDER — ONDANSETRON HCL 4 MG/2ML IJ SOLN
INTRAMUSCULAR | Status: DC | PRN
  Administered 2017-08-09: 4 mg via INTRAVENOUS

## 2017-08-09 MED ORDER — GI COCKTAIL ~~LOC~~: 30.0000 mL | Freq: Once | ORAL | Status: DC

## 2017-08-09 MED ORDER — ESMOLOL HCL 100 MG/10ML IV SOLN
INTRAVENOUS | Status: AC
Start: 2017-08-09 — End: 2017-08-09
  Filled 2017-08-09: qty 10

## 2017-08-09 MED ORDER — ONDANSETRON HCL 4 MG PO TABS: 4.0000 mg | ORAL_TABLET | ORAL | Status: DC | PRN

## 2017-08-09 MED ORDER — POTASSIUM CHLORIDE CRYS ER 20 MEQ PO TBCR
20.0000 meq | EXTENDED_RELEASE_TABLET | Freq: Two times a day (BID) | ORAL | Status: DC
  Administered 2017-08-09 – 2017-08-10 (×2): 20 meq via ORAL
  Filled 2017-08-09 (×2): qty 1

## 2017-08-09 MED ORDER — SUGAMMADEX SODIUM 200 MG/2ML IV SOLN
INTRAVENOUS | Status: DC | PRN
  Administered 2017-08-09: 200 mg via INTRAVENOUS

## 2017-08-09 MED ORDER — ESMOLOL HCL 100 MG/10ML IV SOLN
INTRAVENOUS | Status: DC | PRN
  Administered 2017-08-09: 30 mg via INTRAVENOUS

## 2017-08-09 MED ORDER — FENTANYL CITRATE (PF) 100 MCG/2ML IJ SOLN
INTRAMUSCULAR | Status: DC | PRN
  Administered 2017-08-09 (×2): 50 ug via INTRAVENOUS
  Administered 2017-08-09: 100 ug via INTRAVENOUS

## 2017-08-09 MED ORDER — ONDANSETRON HCL 4 MG/2ML IJ SOLN: 4.0000 mg | Freq: Once | INTRAMUSCULAR | Status: DC | PRN

## 2017-08-09 MED ORDER — HYDRALAZINE HCL 10 MG PO TABS
10.0000 mg | ORAL_TABLET | Freq: Three times a day (TID) | ORAL | Status: DC
  Administered 2017-08-09 – 2017-08-10 (×2): 10 mg via ORAL
  Filled 2017-08-09 (×2): qty 1

## 2017-08-09 MED ORDER — FENTANYL CITRATE (PF) 250 MCG/5ML IJ SOLN
INTRAMUSCULAR | Status: AC
  Filled 2017-08-09: qty 5

## 2017-08-09 MED ORDER — FUROSEMIDE 20 MG PO TABS
20.0000 mg | ORAL_TABLET | Freq: Every day | ORAL | Status: DC
  Administered 2017-08-09 – 2017-08-10 (×2): 20 mg via ORAL
  Filled 2017-08-09 (×2): qty 1

## 2017-08-09 MED ORDER — KCL IN DEXTROSE-NACL 20-5-0.45 MEQ/L-%-% IV SOLN
INTRAVENOUS | Status: DC
  Administered 2017-08-09: 22:00:00 via INTRAVENOUS
  Filled 2017-08-09: qty 1000

## 2017-08-09 MED ORDER — SUGAMMADEX SODIUM 200 MG/2ML IV SOLN
INTRAVENOUS | Status: AC
  Filled 2017-08-09: qty 2

## 2017-08-09 MED ORDER — ACETAMINOPHEN 500 MG PO TABS
1000.0000 mg | ORAL_TABLET | Freq: Four times a day (QID) | ORAL | Status: DC | PRN
  Administered 2017-08-10: 1000 mg via ORAL
  Filled 2017-08-09: qty 2

## 2017-08-09 MED ORDER — ARIPIPRAZOLE 10 MG PO TABS
30.0000 mg | ORAL_TABLET | Freq: Every day | ORAL | Status: DC
  Administered 2017-08-09 – 2017-08-10 (×2): 30 mg via ORAL
  Filled 2017-08-09 (×2): qty 3

## 2017-08-09 MED ORDER — ONDANSETRON HCL 4 MG/2ML IJ SOLN: 4.0000 mg | INTRAMUSCULAR | Status: DC | PRN

## 2017-08-09 MED ORDER — PROPOFOL 10 MG/ML IV BOLUS
INTRAVENOUS | Status: DC | PRN
  Administered 2017-08-09: 140 mg via INTRAVENOUS

## 2017-08-09 MED ORDER — PROPOFOL 10 MG/ML IV BOLUS
INTRAVENOUS | Status: AC
  Filled 2017-08-09: qty 20

## 2017-08-09 MED ORDER — LACTATED RINGERS IV SOLN
INTRAVENOUS | Status: DC
  Administered 2017-08-09: 19:00:00 via INTRAVENOUS

## 2017-08-09 MED ORDER — LIDOCAINE HCL (CARDIAC) 20 MG/ML IV SOLN
INTRAVENOUS | Status: DC | PRN
  Administered 2017-08-09: 60 mg via INTRAVENOUS

## 2017-08-09 MED ORDER — ROCURONIUM BROMIDE 100 MG/10ML IV SOLN
INTRAVENOUS | Status: DC | PRN
  Administered 2017-08-09: 30 mg via INTRAVENOUS

## 2017-08-09 MED ORDER — LIDOCAINE 2% (20 MG/ML) 5 ML SYRINGE
INTRAMUSCULAR | Status: AC
  Filled 2017-08-09: qty 5

## 2017-08-09 MED ORDER — MORPHINE SULFATE (PF) 4 MG/ML IV SOLN: 2.0000 mg | INTRAVENOUS | Status: DC | PRN

## 2017-08-09 MED ORDER — LOSARTAN POTASSIUM 50 MG PO TABS
100.0000 mg | ORAL_TABLET | Freq: Every day | ORAL | Status: DC
  Administered 2017-08-09 – 2017-08-10 (×2): 100 mg via ORAL
  Filled 2017-08-09 (×3): qty 2

## 2017-08-09 MED ORDER — VENLAFAXINE HCL ER 75 MG PO CP24
150.0000 mg | ORAL_CAPSULE | Freq: Every day | ORAL | Status: DC
  Administered 2017-08-10: 150 mg via ORAL
  Filled 2017-08-09: qty 2

## 2017-08-09 MED ORDER — FENTANYL CITRATE (PF) 100 MCG/2ML IJ SOLN: 25.0000 ug | INTRAMUSCULAR | Status: DC | PRN

## 2017-08-09 SURGICAL SUPPLY — 23 items
BALLN PULM 15 16.5 18X75 (BALLOONS)
BALLOON PULM 15 16.5 18X75 (BALLOONS) IMPLANT
CANISTER SUCT 3000ML PPV (MISCELLANEOUS) ×2 IMPLANT
CONT SPEC 4OZ CLIKSEAL STRL BL (MISCELLANEOUS) IMPLANT
COVER BACK TABLE 60X90IN (DRAPES) ×2 IMPLANT
COVER MAYO STAND STRL (DRAPES) IMPLANT
CRADLE DONUT ADULT HEAD (MISCELLANEOUS) IMPLANT
DRAPE HALF SHEET 40X57 (DRAPES) ×2 IMPLANT
GAUZE SPONGE 4X4 16PLY XRAY LF (GAUZE/BANDAGES/DRESSINGS) ×2 IMPLANT
GLOVE BIO SURGEON STRL SZ7.5 (GLOVE) ×2 IMPLANT
GOWN STRL REUS W/ TWL LRG LVL3 (GOWN DISPOSABLE) IMPLANT
GOWN STRL REUS W/TWL LRG LVL3 (GOWN DISPOSABLE)
GUARD TEETH (MISCELLANEOUS) IMPLANT
KIT ROOM TURNOVER OR (KITS) ×2 IMPLANT
NEEDLE HYPO 25GX1X1/2 BEV (NEEDLE) IMPLANT
NEEDLE TRANS ORAL INJECTION (NEEDLE) IMPLANT
NS IRRIG 1000ML POUR BTL (IV SOLUTION) ×2 IMPLANT
PAD ARMBOARD 7.5X6 YLW CONV (MISCELLANEOUS) ×4 IMPLANT
PATTIES SURGICAL .5 X3 (DISPOSABLE) IMPLANT
SOLUTION ANTI FOG 6CC (MISCELLANEOUS) ×2 IMPLANT
SURGILUBE 2OZ TUBE FLIPTOP (MISCELLANEOUS) IMPLANT
TOWEL OR 17X24 6PK STRL BLUE (TOWEL DISPOSABLE) ×4 IMPLANT
TUBE CONNECTING 12X1/4 (SUCTIONS) ×2 IMPLANT

## 2017-08-09 NOTE — Anesthesia Preprocedure Evaluation (Addendum)
Anesthesia Evaluation  Patient identified by MRN, date of birth, ID band Patient awake    Reviewed: Allergy & Precautions, NPO status , Patient's Chart, lab work & pertinent test results  Airway Mallampati: I  TM Distance: >3 FB Neck ROM: Full    Dental  (+) Upper Dentures, Missing,    Pulmonary asthma , former smoker,    Pulmonary exam normal breath sounds clear to auscultation       Cardiovascular hypertension, Pt. on medications +CHF  Normal cardiovascular exam Rhythm:Regular Rate:Normal  ECG: rate 56  ECHO: LV EF: 60% -   65%   Neuro/Psych PSYCHIATRIC DISORDERS TIA   GI/Hepatic negative GI ROS, Neg liver ROS,   Endo/Other  negative endocrine ROS  Renal/GU negative Renal ROS     Musculoskeletal  (+) Arthritis , Rheumatoid disorders,    Abdominal   Peds  Hematology  (+) anemia ,   Anesthesia Other Findings FOREIGN BODY  Reproductive/Obstetrics                            Anesthesia Physical Anesthesia Plan  ASA: III  Anesthesia Plan: General   Post-op Pain Management:    Induction: Intravenous  PONV Risk Score and Plan: 3 and Ondansetron, Dexamethasone and Treatment may vary due to age or medical condition  Airway Management Planned: Oral ETT  Additional Equipment:   Intra-op Plan:   Post-operative Plan: Extubation in OR  Informed Consent: I have reviewed the patients History and Physical, chart, labs and discussed the procedure including the risks, benefits and alternatives for the proposed anesthesia with the patient or authorized representative who has indicated his/her understanding and acceptance.   Dental advisory given  Plan Discussed with: CRNA  Anesthesia Plan Comments:        Anesthesia Quick Evaluation

## 2017-08-09 NOTE — Brief Op Note (Signed)
08/09/2017  7:31 PM  PATIENT:  Kerri Hernandez  82 y.o. female  PRE-OPERATIVE DIAGNOSIS:  Pharyngeal FOREIGN BODY  POST-OPERATIVE DIAGNOSIS:  same  PROCEDURE:  Procedure(s): DIRECT  laryngoscopy and rigid esophagoscopy (N/A)  SURGEON:  Surgeon(s) and Role:    Melida Quitter, MD - Primary  PHYSICIAN ASSISTANT:   ASSISTANTS: none   ANESTHESIA:   general  EBL:  None   BLOOD ADMINISTERED:none  DRAINS: none   LOCAL MEDICATIONS USED:  NONE  SPECIMEN:  No Specimen  DISPOSITION OF SPECIMEN:  N/A  COUNTS:  YES  TOURNIQUET:  * No tourniquets in log *  DICTATION: .Other Dictation: Dictation Number [id number]  PLAN OF CARE: Admit for overnight observation  PATIENT DISPOSITION:  PACU - hemodynamically stable.   Delay start of Pharmacological VTE agent (>24hrs) due to surgical blood loss or risk of bleeding: no

## 2017-08-09 NOTE — ED Notes (Signed)
Hazel from facility called regarding discharge paperwork-wanted clarification concerning Abilify 15 mg to 30 mg-"was this a change in patient's meds"? Dr. Regenia Skeeter reviewed admission/discharge note and stated to continue on 15 mg dose-

## 2017-08-09 NOTE — Transfer of Care (Signed)
Immediate Anesthesia Transfer of Care Note  Patient: Kerri Hernandez  Procedure(s) Performed: DIRECT  laryngoscopy and rigid esophagoscopy (N/A Esophagus)  Patient Location: PACU  Anesthesia Type:General  Level of Consciousness: drowsy and responds to stimulation  Airway & Oxygen Therapy: Patient Spontanous Breathing and Patient connected to nasal cannula oxygen  Post-op Assessment: Report given to RN and Post -op Vital signs reviewed and stable  Post vital signs: Reviewed and stable  Last Vitals:  Vitals:   08/09/17 1829  BP: (!) 194/94  Pulse: 61  Resp: 16  Temp: 36.8 C  SpO2: 99%    Last Pain:  Vitals:   08/09/17 1829  TempSrc: Oral  PainSc: 0-No pain      Patients Stated Pain Goal: 3 (99/23/41 4436)  Complications: No apparent anesthesia complications

## 2017-08-09 NOTE — H&P (Signed)
Kerri Hernandez is an 82 y.o. female.   Chief Complaint: Pharyngeal foreign body HPI: 82 year old female ate pork chops yesterday and felt a piece of bone go down her throat but get stuck.  The feeling persisted and has not passed after drinking and eating.  She went to the ER yesterday where a CT scan of the neck suggested a foreign body in the pharynx.  Fiberoptic exam today did not demonstrate an obvious foreign body but there is an area of purulent secretions in the right pyriform sinus that did not clear with drinking of water.  She presents for surgical evaluation.  Past Medical History:  Diagnosis Date  . Arthritis   . Asthma   . Cancer ([place])   . CHF (congestive heart failure) ([place])   . Hypertension   . TIA (transient ischemic attack)     Past Surgical History:  Procedure Laterality Date  . ABDOMINAL HYSTERECTOMY    . MASTECTOMY      Family History  Problem Relation Age of Onset  . Heart attack Father    Social History:  reports that she has quit smoking. she has never used smokeless tobacco. She reports that she does not drink alcohol or use drugs.  Allergies:  Allergies  Allergen Reactions  . Influenza Vaccines Other (See Comments)    Arm Swelling  . Aspirin Rash    Reaction to regular aspirin - no reaction to low dose aspirin  . Penicillins Rash    Has patient had a PCN reaction causing immediate rash, facial/tongue/throat swelling, SOB or lightheadedness with hypotension: Yes Has patient had a PCN reaction causing severe rash involving mucus membranes or skin necrosis: No Has patient had a PCN reaction that required hospitalization: Yes Has patient had a PCN reaction occurring within the last 10 years: No If all of the above answers are "NO", then may proceed with Cephalosporin use.  . Sulfa Antibiotics Rash    Medications Prior to Admission  Medication Sig Dispense Refill  . acetaminophen (TYLENOL) 500 MG tablet Take 2 tablets (1,000 mg total) by mouth every 6  (six) hours as needed. 30 tablet 0  . amLODipine (NORVASC) 10 MG tablet Take 1 tablet (10 mg total) daily by mouth. 30 tablet 1  . ARIPiprazole (ABILIFY) 30 MG tablet Take 30 mg by mouth daily.    . Cholecalciferol (VITAMIN D PO) Take 1 tablet by mouth daily.    . ferrous sulfate 325 (65 FE) MG tablet Take 325 mg by mouth daily.    . fluticasone (FLONASE) 50 MCG/ACT nasal spray Place 2 sprays into both nostrils daily.     . furosemide (LASIX) 20 MG tablet Take 20 mg daily by mouth.    . hydrALAZINE (APRESOLINE) 10 MG tablet Take 1 tablet (10 mg total) by mouth 3 (three) times daily. To use as needed if patient's blood pressure remains greater than 150/80 when taking regular medications as prescribed. 90 tablet 0  . losartan (COZAAR) 100 MG tablet Take 100 mg by mouth daily.    . Multiple Vitamin (MULTIVITAMIN WITH MINERALS) TABS tablet Take 1 tablet by mouth daily.    . pantoprazole (PROTONIX) 40 MG tablet Take 40 mg by mouth daily.    . potassium chloride SA (K-DUR,KLOR-CON) 20 MEQ tablet Take 1 tablet (20 mEq total) by mouth 2 (two) times daily. 15 tablet 0  . venlafaxine XR (EFFEXOR-XR) 150 MG 24 hr capsule Take 150 mg by mouth daily with breakfast.    . Vitamins  C E (VITAMIN C & E COMPLEX PO) Take 1 tablet by mouth daily.      No results found for this or any previous visit (from the past 48 hour(s)). Ct Soft Tissue Neck Wo Contrast  Result Date: 08/09/2017 CLINICAL DATA:  Pork bones stuck in throat, globus sensation. EXAM: CT NECK WITHOUT CONTRAST TECHNIQUE: Multidetector CT imaging of the neck was performed following the standard protocol without intravenous contrast. COMPARISON:  CT neck December 27, 2016 FINDINGS: PHARYNX AND LARYNX: Curvilinear soft tissue density in central hypopharynx at the free edge of epiglottis. Otherwise negative. Normal larynx. SALIVARY GLANDS: Normal though limited by noncontrast evaluation. THYROID: Stable 13 mm thyroid nodule, below size follow-up recommendation.  LYMPH NODES: No lymphadenopathy by CT size criteria. VASCULAR: Mild calcific atherosclerosis carotid bifurcations. LIMITED INTRACRANIAL: Normal. VISUALIZED ORBITS: Normal. MASTOIDS AND VISUALIZED PARANASAL SINUSES: Well-aerated. SKELETON: Nonacute. Multilevel severe cervical spondylosis. Severe LEFT upper cervical facet arthropathy. Moderate to severe C3-4, severe RIGHT C5-6 and severe bilateral C6-7 neural foraminal narrowing. UPPER CHEST: Lung apices are clear. No superior mediastinal lymphadenopathy. OTHER: None. IMPRESSION: 1. Curvilinear density hypopharynx, possible bone fragment. Recommend direct inspection. Patent airway. 2. Severe C5-6 and C6-7 neural foraminal narrowing. Electronically Signed   By: Elon Alas M.D.   On: 08/09/2017 00:10   Ct Chest Wo Contrast  Result Date: 08/08/2017 CLINICAL DATA:  82 year old female with complain of bone stuck in throat. No difficulty breathing. EXAM: CT CHEST WITHOUT CONTRAST TECHNIQUE: Multidetector CT imaging of the chest was performed following the standard protocol without IV contrast. COMPARISON:  Chest radiograph dated 07/31/2017 FINDINGS: Evaluation of this exam is limited in the absence of intravenous contrast. Cardiovascular: There is mild to moderate cardiomegaly. Minimal pericardial effusion anteriorly measures 5 mm in thickness. There is mild atherosclerotic calcification of the thoracic aorta. There is hypoattenuation of the cardiac blood pool suggestive of a degree of anemia. Clinical correlation is recommended. There is prominence of the pulmonary trunk and main pulmonary arteries suggestive of underlying pulmonary hypertension. Mediastinum/Nodes: There is no hilar or mediastinal adenopathy. Evaluation of the hilum is limited in the absence of intravenous contrast. No mediastinal fluid collection. The esophagus is grossly unremarkable as visualized. There is no radiopaque foreign object in the esophagus. There is a 15 mm right thyroid hypodense  nodule, incompletely evaluated ultrasound may provide better characterization. Lungs/Pleura: There is prominence of the pulmonary vasculature. There is no focal consolidation, pleural effusion, or pneumothorax the central airways are patent. Upper Abdomen: No acute abnormality. Musculoskeletal: There is degenerative changes of the spine. No acute osseous pathology. IMPRESSION: 1. No radiopaque foreign object in the visualized esophagus. 2. Cardiomegaly with mild vascular congestion and findings of pulmonary hypertension. 3. Anemia 4. A 15 mm right thyroid hypodense nodule. Ultrasound may provide better evaluation. Electronically Signed   By: Anner Crete M.D.   On: 08/08/2017 23:52    Review of Systems  HENT: Positive for sore throat.   All other systems reviewed and are negative.   Blood pressure (!) 194/94, pulse 61, temperature 98.2 F (36.8 C), temperature source Oral, resp. rate 16, height 5\' 3"  (1.6 m), weight 130 lb (59 kg), SpO2 99 %. Physical Exam  Constitutional: She is oriented to person, place, and time. She appears well-developed and well-nourished. No distress.  HENT:  Head: Normocephalic and atraumatic.  Right Ear: External ear normal.  Left Ear: External ear normal.  Nose: Nose normal.  Mouth/Throat: Oropharynx is clear and moist.  Eyes: Conjunctivae and EOM are normal. Pupils  are equal, round, and reactive to light.  Neck: Normal range of motion. Neck supple.  Cardiovascular: Normal rate.  Respiratory: Effort normal.  Musculoskeletal: Normal range of motion.  Neurological: She is alert and oriented to person, place, and time. No cranial nerve deficit.  Skin: Skin is warm and dry.  Psychiatric: She has a normal mood and affect. Her behavior is normal. Judgment and thought content normal.     Assessment/Plan Pharyngeal foreign body To OR for direct laryngoscopy and esophagoscopy.  Plan observation overnight due to lack of ride to her facility.  Melida Quitter,  MD 08/09/2017, 6:51 PM

## 2017-08-09 NOTE — Anesthesia Postprocedure Evaluation (Signed)
Anesthesia Post Note  Patient: Kerri Hernandez  Procedure(s) Performed: DIRECT  laryngoscopy and rigid esophagoscopy (N/A Esophagus)     Patient location during evaluation: PACU Anesthesia Type: General Level of consciousness: awake and alert Pain management: pain level controlled Vital Signs Assessment: post-procedure vital signs reviewed and stable Respiratory status: spontaneous breathing, nonlabored ventilation, respiratory function stable and patient connected to nasal cannula oxygen Cardiovascular status: blood pressure returned to baseline and stable Postop Assessment: no apparent nausea or vomiting Anesthetic complications: no    Last Vitals:  Vitals:   08/09/17 2015 08/09/17 2110  BP: 118/77 (!) 149/79  Pulse: 62 62  Resp: 15 16  Temp:  (!) 36.4 C  SpO2: 98% 96%    Last Pain:  Vitals:   08/09/17 2110  TempSrc: Oral  PainSc:                  Karyl Kinnier Leisl Spurrier

## 2017-08-09 NOTE — Anesthesia Procedure Notes (Signed)
Procedure Name: Intubation Date/Time: 08/09/2017 7:21 PM Performed by: White, Amedeo Plenty, CRNA Pre-anesthesia Checklist: Patient identified, Emergency Drugs available, Suction available and Patient being monitored Patient Re-evaluated:Patient Re-evaluated prior to induction Oxygen Delivery Method: Circle System Utilized Preoxygenation: Pre-oxygenation with 100% oxygen Induction Type: IV induction and Rapid sequence Ventilation: Mask ventilation without difficulty Laryngoscope Size: Mac and 3 Grade View: Grade I Tube type: Oral Tube size: 6.5 mm Number of attempts: 1 Airway Equipment and Method: Stylet Placement Confirmation: ETT inserted through vocal cords under direct vision,  positive ETCO2 and breath sounds checked- equal and bilateral Secured at: 22 cm Tube secured with: Tape Dental Injury: Teeth and Oropharynx as per pre-operative assessment

## 2017-08-09 NOTE — Discharge Instructions (Signed)
Your imaging today shows evidence of a bone fragment in your throat.  As you are able to tolerate fluids and appeared stable, the otolaryngology team felt you are stable for discharge home.  Please go to the your clinic tomorrow morning after calling them to have the bone removed and further managed.  If you have any worsened choking, difficulty breathing, inability to swallow, or other problems, please return immediately to the nearest emergency department.`

## 2017-08-10 ENCOUNTER — Encounter (HOSPITAL_COMMUNITY): Payer: Self-pay | Admitting: Otolaryngology

## 2017-08-10 DIAGNOSIS — T17208A Unspecified foreign body in pharynx causing other injury, initial encounter: Secondary | ICD-10-CM | POA: Diagnosis not present

## 2017-08-10 LAB — MRSA PCR SCREENING: MRSA by PCR: NEGATIVE

## 2017-08-10 NOTE — Op Note (Signed)
NAME:  Kerri Hernandez, Kerri Hernandez NO.:  [id number]  MEDICAL RECORD NO.:  [id number]  LOCATION:                                 FACILITY:  PHYSICIAN:  Onnie Graham, MD     DATE OF BIRTH:  16-Aug-1935  DATE OF PROCEDURE:  08/09/2017 DATE OF DISCHARGE:  08/10/2017                              OPERATIVE REPORT   PREOPERATIVE DIAGNOSIS:  Pharyngeal foreign body.  POSTOPERATIVE DIAGNOSIS:  Pharyngeal foreign body.  PROCEDURES: 1. Direct laryngoscopy. 2. Rigid esophagoscopy.  SURGEON:  Onnie Graham, MD.  ANESTHESIA:  General endotracheal anesthesia.  COMPLICATIONS:  None.  INDICATION:  The patient is a 82 year old female, who was eating pork chops yesterday for lunch and felt a piece of bone go down her throat and get stuck.  The feeling has not passed in spite of drinking and swallowing food since then.  She was seen in the emergency department last evening and a CT scan suggested a foreign body in the pharynx. Fiberoptic exam in the office today did not demonstrate an obvious foreign body, but there was a patch of purulent debris in the right piriform sinus that would not clear with swallowing.  Thus, she presents to the operating room for surgical management.  FINDINGS:  There was no sign of any foreign body or perforation or other injury in the pharynx, larynx, or esophagus.  DESCRIPTION OF PROCEDURE:  The patient was identified in the holding room and informed consent having been obtained including discussion of risks, benefits, and alternatives, the patient was brought to the operative suite and put on the operating table in supine position. Anesthesia was induced and the patient was intubated by Anesthesia team without difficulty.  The eyes taped closed and bed was turned 90 degrees from anesthesia.  A damp gauze was placed over the upper gum and an anterior commissure laryngoscope was then used to view the various areas of the pharynx and larynx including  the vallecula, piriform sinuses, postcricoid region, and endolarynx.  Suction was used to remove secretions for careful examination.  After this, laryngoscope was removed and a cervical esophagoscope was inserted and passed down the esophagus keeping the lumen in view and suctioning foamy saliva.  The esophagoscope was then slowly backed out of viewing the lumen carefully.  The gauze was removed and the patient was returned to Anesthesia for wake-up and was extubated and moved to the recovery room in stable condition.     Onnie Graham, MD     DDB/MEDQ  D:  08/09/2017  T:  08/09/2017  Job:  [id number]  cc:   Onnie Graham, MD's office

## 2017-08-10 NOTE — Progress Notes (Signed)
Pt doing well and taking a soft diet without difficulty.  She will be discharged today back to [facility].

## 2017-08-10 NOTE — Progress Notes (Signed)
Pt back to [facility] facility accompanied by driver and helper.  Pt has all belongings with her and clothes.  DC instructions reviewed and also which meds were given to pt this am.copy given.

## 2017-08-10 NOTE — Op Note (Deleted)
  The note originally documented on this encounter has been moved the the encounter in which it belongs.  

## 2017-08-10 NOTE — Discharge Summary (Signed)
Physician Discharge Summary  Patient ID: Kerri Hernandez MRN: [id number] DOB/AGE: 80-Aug-1937 80 y.o.  Admit date: 08/09/2017 Discharge date: 08/10/2017  Admission Diagnoses: Pharyngeal foreign body  Discharge Diagnoses:  Active Problems:   Pharyngeal foreign body   Discharged Condition: good  Hospital Course: 82 year old female felt a bone from a pork chop get stuck in her throat on the day prior to admission.  She was taken to the operating room for evaluation and then observed overnight.  She did well overnight and feels good today.  Consults: None  Significant Diagnostic Studies: None  Treatments: surgery: Direct laryngoscopy and esophagoscopy  Discharge Exam: Blood pressure (!) 145/72, pulse (!) 57, temperature 97.9 F (36.6 C), temperature source Oral, resp. rate 17, height 5\' 3"  (1.6 m), weight 131 lb 9.8 oz (59.7 kg), SpO2 100 %. General appearance: alert, cooperative and no distress Throat: no bleeding  Disposition: 01-Home or Self Care  Discharge Instructions    Diet - low sodium heart healthy   Complete by:  As directed    Discharge instructions   Complete by:  As directed    Resume normal diet and activities.   Increase activity slowly   Complete by:  As directed      Allergies as of 08/10/2017      Reactions   Influenza Vaccines Other (See Comments)   Arm Swelling   Aspirin Rash   Reaction to regular aspirin - no reaction to low dose aspirin   Penicillins Rash   Has patient had a PCN reaction causing immediate rash, facial/tongue/throat swelling, SOB or lightheadedness with hypotension: Yes Has patient had a PCN reaction causing severe rash involving mucus membranes or skin necrosis: No Has patient had a PCN reaction that required hospitalization: Yes Has patient had a PCN reaction occurring within the last 10 years: No If all of the above answers are "NO", then may proceed with Cephalosporin use.   Sulfa Antibiotics Rash      Medication List     TAKE these medications   acetaminophen 500 MG tablet Commonly known as:  TYLENOL Take 2 tablets (1,000 mg total) by mouth every 6 (six) hours as needed.   amLODipine 10 MG tablet Commonly known as:  NORVASC Take 1 tablet (10 mg total) daily by mouth.   ARIPiprazole 30 MG tablet Commonly known as:  ABILIFY Take 30 mg by mouth daily.   ferrous sulfate 325 (65 FE) MG tablet Take 325 mg by mouth daily.   fluticasone 50 MCG/ACT nasal spray Commonly known as:  FLONASE Place 2 sprays into both nostrils daily.   furosemide 20 MG tablet Commonly known as:  LASIX Take 20 mg daily by mouth.   hydrALAZINE 10 MG tablet Commonly known as:  APRESOLINE Take 1 tablet (10 mg total) by mouth 3 (three) times daily. To use as needed if patient's blood pressure remains greater than 150/80 when taking regular medications as prescribed.   losartan 100 MG tablet Commonly known as:  COZAAR Take 100 mg by mouth daily.   multivitamin with minerals Tabs tablet Take 1 tablet by mouth daily.   pantoprazole 40 MG tablet Commonly known as:  PROTONIX Take 40 mg by mouth daily.   potassium chloride SA 20 MEQ tablet Commonly known as:  K-DUR,KLOR-CON Take 1 tablet (20 mEq total) by mouth 2 (two) times daily.   venlafaxine XR 150 MG 24 hr capsule Commonly known as:  EFFEXOR-XR Take 150 mg by mouth daily with breakfast.   VITAMIN C &  E COMPLEX PO Take 1 tablet by mouth daily.   VITAMIN D PO Take 1 tablet by mouth daily.        SignedRedmond Baseman, Trayvon Trumbull 08/10/2017, 9:19 AM

## 2017-08-13 ENCOUNTER — Emergency Department (HOSPITAL_COMMUNITY)
Admission: EM | Admit: 2017-08-13 | Discharge: 2017-08-13 | Disposition: A | Discharge status: Home / Self Care | Payer: Medicare Other | Attending: Emergency Medicine | Admitting: Emergency Medicine

## 2017-08-13 ENCOUNTER — Other Ambulatory Visit: Payer: Self-pay

## 2017-08-13 ENCOUNTER — Emergency Department (HOSPITAL_COMMUNITY): Payer: Medicare Other

## 2017-08-13 ENCOUNTER — Encounter (HOSPITAL_COMMUNITY): Payer: Self-pay | Admitting: Emergency Medicine

## 2017-08-13 DIAGNOSIS — Z859 Personal history of malignant neoplasm, unspecified: Secondary | ICD-10-CM | POA: Diagnosis not present

## 2017-08-13 DIAGNOSIS — Z79899 Other long term (current) drug therapy: Secondary | ICD-10-CM | POA: Diagnosis not present

## 2017-08-13 DIAGNOSIS — R0789 Other chest pain: Secondary | ICD-10-CM

## 2017-08-13 DIAGNOSIS — R0989 Other specified symptoms and signs involving the circulatory and respiratory systems: Secondary | ICD-10-CM

## 2017-08-13 DIAGNOSIS — F458 Other somatoform disorders: Secondary | ICD-10-CM | POA: Diagnosis not present

## 2017-08-13 DIAGNOSIS — R198 Other specified symptoms and signs involving the digestive system and abdomen: Secondary | ICD-10-CM

## 2017-08-13 DIAGNOSIS — J45909 Unspecified asthma, uncomplicated: Secondary | ICD-10-CM | POA: Diagnosis not present

## 2017-08-13 DIAGNOSIS — M542 Cervicalgia: Secondary | ICD-10-CM | POA: Diagnosis present

## 2017-08-13 DIAGNOSIS — I110 Hypertensive heart disease with heart failure: Secondary | ICD-10-CM | POA: Insufficient documentation

## 2017-08-13 DIAGNOSIS — Z87891 Personal history of nicotine dependence: Secondary | ICD-10-CM | POA: Diagnosis not present

## 2017-08-13 DIAGNOSIS — I509 Heart failure, unspecified: Secondary | ICD-10-CM | POA: Diagnosis not present

## 2017-08-13 LAB — BASIC METABOLIC PANEL
Anion gap: 13 (ref 5–15)
BUN: 12 mg/dL (ref 6–20)
CALCIUM: 9.3 mg/dL (ref 8.9–10.3)
CO2: 26 mmol/L (ref 22–32)
Chloride: 102 mmol/L (ref 101–111)
Creatinine, Ser: 1.03 mg/dL — ABNORMAL HIGH (ref 0.44–1.00)
GFR, EST AFRICAN AMERICAN: 58 mL/min — AB (ref >60)
GFR, EST NON AFRICAN AMERICAN: 50 mL/min — AB (ref >60)
GLUCOSE: 95 mg/dL (ref 65–99)
Potassium: 3.4 mmol/L — ABNORMAL LOW (ref 3.5–5.1)
SODIUM: 141 mmol/L (ref 135–145)

## 2017-08-13 LAB — CBC WITH DIFFERENTIAL/PLATELET
BASOS ABS: 0 10*3/uL (ref 0.0–0.1)
BASOS PCT: 0 %
EOS ABS: 0.1 10*3/uL (ref 0.0–0.7)
EOS PCT: 2 %
HCT: 34 % — ABNORMAL LOW (ref 36.0–46.0)
Hemoglobin: 11.1 g/dL — ABNORMAL LOW (ref 12.0–15.0)
Lymphocytes Relative: 28 %
Lymphs Abs: 2.2 10*3/uL (ref 0.7–4.0)
MCH: 28.7 pg (ref 26.0–34.0)
MCHC: 32.6 g/dL (ref 30.0–36.0)
MCV: 87.9 fL (ref 78.0–100.0)
MONO ABS: 1.1 10*3/uL — AB (ref 0.1–1.0)
Monocytes Relative: 14 %
Neutro Abs: 4.3 10*3/uL (ref 1.7–7.7)
Neutrophils Relative %: 56 %
PLATELETS: 218 10*3/uL (ref 150–400)
RBC: 3.87 MIL/uL (ref 3.87–5.11)
RDW: 14.8 % (ref 11.5–15.5)
WBC: 7.7 10*3/uL (ref 4.0–10.5)

## 2017-08-13 LAB — TROPONIN I: Troponin I: <0.03 ng/mL (ref <0.03)

## 2017-08-13 MED ORDER — GI COCKTAIL ~~LOC~~
30.0000 mL | Freq: Once | ORAL | Status: AC
  Administered 2017-08-13: 30 mL via ORAL
  Filled 2017-08-13: qty 30

## 2017-08-13 MED ORDER — IOPAMIDOL (ISOVUE-300) INJECTION 61%
INTRAVENOUS | Status: AC
  Administered 2017-08-13: 75 mL
  Filled 2017-08-13: qty 75

## 2017-08-13 NOTE — Discharge Instructions (Addendum)
As discussed, your evaluation today has been largely reassuring.  But, it is important that you monitor your condition carefully, and do not hesitate to return to the ED if you develop new, or concerning changes in your condition. ? ?Otherwise, please follow-up with your physician for appropriate ongoing care. ? ?

## 2017-08-13 NOTE — ED Notes (Signed)
Patient transported to X-ray 

## 2017-08-13 NOTE — ED Notes (Signed)
Patient offered ptar for transport to nursing facility. Patient declined. Patient states pastor will pick patient up from lobby.

## 2017-08-13 NOTE — ED Provider Notes (Signed)
[facility] EMERGENCY DEPARTMENT Provider Note   CSN: [id number] Arrival date & time:        History   Chief Complaint Chief Complaint  Patient presents with  . throat pain    HPI Kerri Hernandez is a 82 y.o. female.  Patient presents via EMS with acute onset of throat and neck pain since 6 PM.  She states this started when she was eating a piece of corn bread.  States she had pain with swallowing and felt that the corn bed was gritty.  Notably Patient was seen in the ED last week with a foreign body sensation and a questionable bone in her throat.  She went to the OR with ENT where nothing was found on laryngoscopy or esophagoscopy.  Patient states she is doing fine since then.  She has no difficulty breathing.  Does have pain with swallowing.  She denies any fever, cough, vomiting.  No abdominal pain.  She had intermittent central chest pain since yesterday that is worse with coughing and palpation.   The history is provided by the patient and the EMS personnel.    Past Medical History:  Diagnosis Date  . Arthritis   . Asthma   . Cancer ([place])   . CHF (congestive heart failure) ([place])   . Hypertension   . TIA (transient ischemic attack)     Patient Active Problem List   Diagnosis Date Noted  . Pharyngeal foreign body 08/09/2017  . Pressure injury of skin 04/30/2017  . Chest pain 04/29/2017  . Hypertension 04/29/2017  . Aortic valve regurgitation     Past Surgical History:  Procedure Laterality Date  . ABDOMINAL HYSTERECTOMY    . DIRECT LARYNGOSCOPY N/A 08/09/2017   Procedure: DIRECT  laryngoscopy and rigid esophagoscopy;  Surgeon: Melida Quitter, MD;  Location: [facility];  Service: ENT;  Laterality: N/A;  . MASTECTOMY      OB History    No data available       Home Medications    Prior to Admission medications   Medication Sig Start Date End Date Taking? Authorizing Provider  acetaminophen (TYLENOL) 500 MG tablet Take 2 tablets (1,000 mg  total) by mouth every 6 (six) hours as needed. 07/31/17   Charlesetta Shanks, MD  amLODipine (NORVASC) 10 MG tablet Take 1 tablet (10 mg total) daily by mouth. 05/01/17   Burgess Estelle, MD  ARIPiprazole (ABILIFY) 30 MG tablet Take 30 mg by mouth daily.    [provider]  Cholecalciferol (VITAMIN D PO) Take 1 tablet by mouth daily.    [provider]  ferrous sulfate 325 (65 FE) MG tablet Take 325 mg by mouth daily.    [provider]  fluticasone (FLONASE) 50 MCG/ACT nasal spray Place 2 sprays into both nostrils daily.     [provider]  furosemide (LASIX) 20 MG tablet Take 20 mg daily by mouth.    [provider]  hydrALAZINE (APRESOLINE) 10 MG tablet Take 1 tablet (10 mg total) by mouth 3 (three) times daily. To use as needed if patient's blood pressure remains greater than 150/80 when taking regular medications as prescribed. 07/31/17   Charlesetta Shanks, MD  losartan (COZAAR) 100 MG tablet Take 100 mg by mouth daily.    [provider]  Multiple Vitamin (MULTIVITAMIN WITH MINERALS) TABS tablet Take 1 tablet by mouth daily.    [provider]  pantoprazole (PROTONIX) 40 MG tablet Take 40 mg by mouth daily.    [provider]  potassium chloride SA (K-DUR,KLOR-CON) 20 MEQ tablet Take 1 tablet (20 mEq total) by mouth 2 (two) times daily. 6/96/29   Delora Fuel, MD  venlafaxine XR (EFFEXOR-XR) 150 MG 24 hr capsule Take 150 mg by mouth daily with breakfast.    [provider]  Vitamins C E (VITAMIN C & E COMPLEX PO) Take 1 tablet by mouth daily.    [provider]    Family History Family History  Problem Relation Age of Onset  . Heart attack Father     Social History Social History   Tobacco Use  . Smoking status: Former Research scientist (life sciences)  . Smokeless tobacco: Never Used  . Tobacco comment: As a teenager.  Substance Use Topics  . Alcohol use: No    Comment: Occasional wine in the past  . Drug use: No      Allergies   Influenza vaccines; Aspirin; Penicillins; and Sulfa antibiotics   Review of Systems Review of Systems  Constitutional: Negative for activity change, appetite change and fever.  HENT: Positive for sore throat and trouble swallowing.   Eyes: Negative for visual disturbance.  Respiratory: Negative for cough, chest tightness and shortness of breath.   Cardiovascular: Negative for chest pain.  Gastrointestinal: Negative for abdominal pain, nausea and vomiting.  Genitourinary: Negative for dysuria and hematuria.  Musculoskeletal: Negative for arthralgias and myalgias.  Skin: Negative for rash.  Neurological: Negative for dizziness, weakness and headaches.    all other systems are negative except as noted in the HPI and PMH.    Physical Exam Updated Vital Signs BP 130/80 (BP Location: Left Arm)   Pulse (!) 57   Temp 98.3 F (36.8 C) (Oral)   Resp 16   Wt 59.4 kg (131 lb)   SpO2 98%   BMI 23.21 kg/m   Physical Exam  Constitutional: She is oriented to person, place, and time. She appears well-developed and well-nourished. No distress.  HENT:  Head: Normocephalic and atraumatic.  Mouth/Throat: Oropharynx is clear and moist. No oropharyngeal exudate.  No stridor, controlling secretions, no tongue or lip swelling.  Oropharynx appears symmetrical. No visualized foreign body Full range of motion of neck bilaterally  Eyes: Conjunctivae and EOM are normal. Pupils are equal, round, and reactive to light.  Neck: Normal range of motion. Neck supple.  No meningismus.  Cardiovascular: Normal rate, regular rhythm, normal heart sounds and intact distal pulses.  No murmur heard. Pulmonary/Chest: Effort normal and breath sounds normal. No respiratory distress. She exhibits tenderness.  Left-sided chest pain worse with palpation  Abdominal: Soft. There is no tenderness. There is no rebound and no guarding.  Musculoskeletal: Normal range of motion. She exhibits no edema or  tenderness.  Neurological: She is alert and oriented to person, place, and time. No cranial nerve deficit. She exhibits normal muscle tone. Coordination normal.  No ataxia on finger to nose bilaterally. No pronator drift. 5/5 strength throughout. CN 2-12 intact.Equal grip strength. Sensation intact.   Skin: Skin is warm. Capillary refill takes less than 2 seconds.  Psychiatric: She has a normal mood and affect. Her behavior is normal.  Nursing note and vitals reviewed.    ED Treatments / Results  Labs (all labs ordered are listed, but only abnormal results are displayed) Labs Reviewed  CBC WITH DIFFERENTIAL/PLATELET - Abnormal; Notable for the following components:      Result Value   Hemoglobin 11.1 (*)    HCT 34.0 (*)    Monocytes Absolute 1.1 (*)  All other components within normal limits  BASIC METABOLIC PANEL - Abnormal; Notable for the following components:   Potassium 3.4 (*)    Creatinine, Ser 1.03 (*)    GFR calc non Af Amer 50 (*)    GFR calc Af Amer 58 (*)    All other components within normal limits  TROPONIN I  TROPONIN I    EKG  EKG Interpretation  Date/Time:  Tuesday August 13 2017 05:22:35 EST Ventricular Rate:  58 PR Interval:    QRS Duration: 90 QT Interval:  403 QTC Calculation: 396 R Axis:   59 Text Interpretation:  Sinus rhythm Ventricular premature complex LVH with secondary repolarization abnormality No significant change was found Confirmed by Ezequiel Essex [phone]) on 08/13/2017 5:34:54 AM Also confirmed by Ezequiel Essex [phone]), editor Hattie Perch [phone])  on 08/13/2017 7:06:58 AM       Radiology Dg Chest 2 View  Result Date: 08/13/2017 CLINICAL DATA:  [age]-year-old female with chest pain. EXAM: CHEST  2 VIEW COMPARISON:  Chest CT dated 08/08/2017 FINDINGS: The lungs are clear. There is no pleural effusion or pneumothorax. There is mild to moderate cardiomegaly. No acute osseous pathology. IMPRESSION: No acute cardiopulmonary  process. Cardiomegaly. Electronically Signed   By: Anner Crete M.D.   On: 08/13/2017 04:41    Procedures Procedures (including critical care time)  Medications Ordered in ED Medications - No data to display   Initial Impression / Assessment and Plan / ED Course  I have reviewed the triage vital signs and the nursing notes.  Pertinent labs & imaging results that were available during my care of the patient were reviewed by me and considered in my medical decision making (see chart for details).     Patient presents with pain to her right side of her neck and throat since eating cornbread last night.  She is concerned she has a foreign body in her throat again as she did several days ago.  ENT took her to the OR where no foreign body was found.  She is not in any distress.  She is controlling her secretions and speaking full sentences  Patient's chest pain seems atypical for ACS and is reproducible.  EKG is unchanged.  Chest x-ray is negative.  Patient given GI cocktail with improvement in her sensation of foreign body in her throat.  Imaging will be obtained to evaluate for recurrence of foreign body.  Repeat troponin pending.  Dr. Vanita Panda to assume care at shift change Final Clinical Impressions(s) / ED Diagnoses   Final diagnoses:  Globus sensation    ED Discharge Orders    None       Rolande Moe, Annie Main, MD 08/13/17 [phone]

## 2017-08-13 NOTE — ED Notes (Signed)
Patient in ct 

## 2017-08-13 NOTE — ED Provider Notes (Signed)
8:16 AM Patient awake and alert, no distress, no visible difficulty swallowing or speaking. She is aware of all findings, appropriate for discharge, will follow up with ENT and primary care.    Carmin Muskrat, MD 08/13/17 [phone]

## 2017-08-13 NOTE — ED Triage Notes (Signed)
Pt in via [facility] from [facility], c/o R side throat pain after eating corn bread last night. States painful to swallow, sats 98% on RA, breathing non-labored. Recent laryngoscopy on 22nd after getting pork bone stuck in throat. A&ox4, voice clear

## 2017-08-25 ENCOUNTER — Emergency Department (HOSPITAL_COMMUNITY)
Admission: EM | Admit: 2017-08-25 | Discharge: 2017-08-26 | Disposition: A | Discharge status: Other Acute Inpatient Hospital | Payer: Medicare Other | Attending: Emergency Medicine | Admitting: Emergency Medicine

## 2017-08-25 DIAGNOSIS — I10 Essential (primary) hypertension: Secondary | ICD-10-CM | POA: Insufficient documentation

## 2017-08-25 DIAGNOSIS — Y92122 Bedroom in nursing home as the place of occurrence of the external cause: Secondary | ICD-10-CM | POA: Diagnosis not present

## 2017-08-25 DIAGNOSIS — S0083XA Contusion of other part of head, initial encounter: Secondary | ICD-10-CM | POA: Diagnosis not present

## 2017-08-25 DIAGNOSIS — M25562 Pain in left knee: Secondary | ICD-10-CM | POA: Insufficient documentation

## 2017-08-25 DIAGNOSIS — Y999 Unspecified external cause status: Secondary | ICD-10-CM | POA: Insufficient documentation

## 2017-08-25 DIAGNOSIS — Y939 Activity, unspecified: Secondary | ICD-10-CM | POA: Insufficient documentation

## 2017-08-25 DIAGNOSIS — Y929 Unspecified place or not applicable: Secondary | ICD-10-CM | POA: Diagnosis not present

## 2017-08-25 DIAGNOSIS — I509 Heart failure, unspecified: Secondary | ICD-10-CM | POA: Diagnosis not present

## 2017-08-25 DIAGNOSIS — M25511 Pain in right shoulder: Secondary | ICD-10-CM | POA: Diagnosis not present

## 2017-08-25 DIAGNOSIS — Z79899 Other long term (current) drug therapy: Secondary | ICD-10-CM | POA: Diagnosis not present

## 2017-08-25 DIAGNOSIS — W06XXXA Fall from bed, initial encounter: Secondary | ICD-10-CM | POA: Diagnosis not present

## 2017-08-25 DIAGNOSIS — Z043 Encounter for examination and observation following other accident: Secondary | ICD-10-CM | POA: Diagnosis present

## 2017-08-25 NOTE — ED Notes (Signed)
Bed: [facility] Expected date:  Expected time:  Means of arrival:  Comments: EMS 82 yo female fall shoulder and knee pain

## 2017-08-25 NOTE — ED Triage Notes (Signed)
Patient arrives by [facility] from [facility] with complaints of a fall-med tech tried to give medicine and states she was sleepy so she did not give her medicine. Med tech heard patient yelling and found patient on floor-rolled up in the covers. Patient complains of right shoulder and left knee pain with no deformity. Patient has a hematoma to left forehead area. No LOC.

## 2017-08-26 NOTE — ED Provider Notes (Signed)
[place] DEPT Provider Note: Georgena Spurling, MD, FACEP  CSN: [id number] MRN: [id number] ARRIVAL: 08/25/17 at [place]: [place]   HISTORY OF PRESENT ILLNESS  08/26/17 1:42 AM Kerri Hernandez is a 82 y.o. female who had an unwitnessed fall at her living facility yesterday evening.  She was found on the floor wrapped in her bed linens.  She was complaining of right shoulder pain and left knee pain but no deformity was noted.  She also has a hematoma to her left forehead with moderate associated pain, worse with palpation.  She has not been vomiting.  She has been observed in the ED for about 3 hours.  She has been awake and alert, frequently using her cell phone without difficulty.   Past Medical History:  Diagnosis Date  . Arthritis   . Asthma   . Cancer ([place])   . CHF (congestive heart failure) ([place])   . Hypertension   . TIA (transient ischemic attack)     Past Surgical History:  Procedure Laterality Date  . ABDOMINAL HYSTERECTOMY    . DIRECT LARYNGOSCOPY N/A 08/09/2017   Procedure: DIRECT  laryngoscopy and rigid esophagoscopy;  Surgeon: Melida Quitter, MD;  Location: [facility] OR;  Service: ENT;  Laterality: N/A;  . MASTECTOMY      Family History  Problem Relation Age of Onset  . Heart attack Father     Social History   Tobacco Use  . Smoking status: Former Research scientist (life sciences)  . Smokeless tobacco: Never Used  . Tobacco comment: As a teenager.  Substance Use Topics  . Alcohol use: No    Comment: Occasional wine in the past  . Drug use: No    Prior to Admission medications   Medication Sig Start Date End Date Taking? Authorizing Provider  acetaminophen (TYLENOL) 500 MG tablet Take 2 tablets (1,000 mg total) by mouth every 6 (six) hours as needed. 07/31/17   Charlesetta Shanks, MD  amLODipine (NORVASC) 10 MG tablet Take 1 tablet (10 mg total) daily by mouth. 05/01/17   Burgess Estelle, MD  ARIPiprazole (ABILIFY) 30 MG tablet Take 30 mg by mouth daily.     [provider]  Cholecalciferol (VITAMIN D PO) Take 1 tablet by mouth daily.    [provider]  ferrous sulfate 325 (65 FE) MG tablet Take 325 mg by mouth daily.    [provider]  fluticasone (FLONASE) 50 MCG/ACT nasal spray Place 2 sprays into both nostrils daily.     [provider]  furosemide (LASIX) 20 MG tablet Take 20 mg daily by mouth.    [provider]  hydrALAZINE (APRESOLINE) 10 MG tablet Take 1 tablet (10 mg total) by mouth 3 (three) times daily. To use as needed if patient's blood pressure remains greater than 150/80 when taking regular medications as prescribed. 07/31/17   Charlesetta Shanks, MD  losartan (COZAAR) 100 MG tablet Take 100 mg by mouth daily.    [provider]  Multiple Vitamin (MULTIVITAMIN WITH MINERALS) TABS tablet Take 1 tablet by mouth daily.    [provider]  pantoprazole (PROTONIX) 40 MG tablet Take 40 mg by mouth daily.    [provider]  potassium chloride SA (K-DUR,KLOR-CON) 20 MEQ tablet Take 1 tablet (20 mEq total) by mouth 2 (two) times daily. 02/14/92   Delora Fuel, MD  venlafaxine XR (EFFEXOR-XR) 150 MG 24 hr capsule Take 150 mg by mouth daily with breakfast.    [provider]  Vitamins C E (VITAMIN C & E COMPLEX PO) Take 1 tablet by mouth daily.    [provider]    Allergies Influenza vaccines; Aspirin; Penicillins; and Sulfa antibiotics   REVIEW OF SYSTEMS  Negative except as noted here or in the History of Present Illness.   PHYSICAL EXAMINATION  Initial Vital Signs Blood pressure (!) 183/82, pulse (!) 57, temperature 98.6 F (37 C), temperature source Oral, resp. rate 16, SpO2 100 %.  Examination General: Well-developed, well-nourished female in no acute distress; appearance consistent with age of record HENT: normocephalic; left forehead hematoma without bony deformity or crepitus; left TM normal; right TM obscured by cerumen Eyes: pupils  equal, round and reactive to light; extraocular muscles intact Neck: supple; nontender Heart: regular rate and rhythm Lungs: clear to auscultation bilaterally Abdomen: soft; nondistended; nontender; bowel sounds present Extremities: Arthritic changes, notably of knees with decreased range of motion; mild tenderness of right biceps without ecchymosis or swelling; mild tenderness of knees bilaterally without ecchymosis or swelling Neurologic: Awake, alert and oriented; motor function intact in all extremities and symmetric; no facial droop; tremulous Skin: Warm and dry Psychiatric: Flat affect   RESULTS  Summary of this visit's results, reviewed by myself:   EKG Interpretation  Date/Time:    Ventricular Rate:    PR Interval:    QRS Duration:   QT Interval:    QTC Calculation:   R Axis:     Text Interpretation:        Laboratory Studies: No results found for this or any previous visit (from the past 24 hour(s)). Imaging Studies: No results found.  ED COURSE  Nursing notes and initial vitals signs, including pulse oximetry, reviewed.  Vitals:   08/26/17 0150  BP: (!) 183/82  Pulse: (!) 57  Resp: 16  Temp: 98.6 F (37 C)  TempSrc: Oral  SpO2: 100%   The patient is not on anticoagulants and I do not believe head CT is indicated at this time.  As noted above she has been observed for several hours without change.  I see no evidence of acute bony injury on examination.  PROCEDURES    ED DIAGNOSES     ICD-10-CM   1. Fall from bed, initial encounter W06.XXXA   2. Traumatic hematoma of forehead, initial encounter H03.88EK        Shanon Rosser, MD 08/26/17 [phone]

## 2017-09-15 ENCOUNTER — Encounter (HOSPITAL_COMMUNITY): Payer: Self-pay | Admitting: Emergency Medicine

## 2017-09-15 ENCOUNTER — Emergency Department (HOSPITAL_COMMUNITY): Payer: Medicare Other

## 2017-09-15 ENCOUNTER — Emergency Department (HOSPITAL_COMMUNITY)
Admission: EM | Admit: 2017-09-15 | Discharge: 2017-09-15 | Disposition: A | Discharge status: Skilled Nursing Facility | Payer: Medicare Other | Attending: Emergency Medicine | Admitting: Emergency Medicine

## 2017-09-15 DIAGNOSIS — J45909 Unspecified asthma, uncomplicated: Secondary | ICD-10-CM | POA: Insufficient documentation

## 2017-09-15 DIAGNOSIS — I509 Heart failure, unspecified: Secondary | ICD-10-CM | POA: Insufficient documentation

## 2017-09-15 DIAGNOSIS — Z87891 Personal history of nicotine dependence: Secondary | ICD-10-CM | POA: Insufficient documentation

## 2017-09-15 DIAGNOSIS — M19011 Primary osteoarthritis, right shoulder: Secondary | ICD-10-CM | POA: Diagnosis not present

## 2017-09-15 DIAGNOSIS — Z859 Personal history of malignant neoplasm, unspecified: Secondary | ICD-10-CM | POA: Insufficient documentation

## 2017-09-15 DIAGNOSIS — M25511 Pain in right shoulder: Secondary | ICD-10-CM | POA: Diagnosis present

## 2017-09-15 DIAGNOSIS — I110 Hypertensive heart disease with heart failure: Secondary | ICD-10-CM | POA: Insufficient documentation

## 2017-09-15 DIAGNOSIS — Z79899 Other long term (current) drug therapy: Secondary | ICD-10-CM | POA: Insufficient documentation

## 2017-09-15 MED ORDER — FENTANYL CITRATE (PF) 100 MCG/2ML IJ SOLN
25.0000 ug | INTRAMUSCULAR | Status: DC | PRN
  Administered 2017-09-15: 25 ug via INTRAVENOUS
  Filled 2017-09-15: qty 2

## 2017-09-15 MED ORDER — SODIUM CHLORIDE 0.9 % IV SOLN
INTRAVENOUS | Status: DC
  Administered 2017-09-15: 12:00:00 via INTRAVENOUS

## 2017-09-15 MED ORDER — ONDANSETRON HCL 4 MG/2ML IJ SOLN
4.0000 mg | Freq: Once | INTRAMUSCULAR | Status: AC
  Administered 2017-09-15: 4 mg via INTRAVENOUS
  Filled 2017-09-15: qty 2

## 2017-09-15 MED ORDER — TRAMADOL-ACETAMINOPHEN 37.5-325 MG PO TABS: 1.0000 | ORAL_TABLET | Freq: Four times a day (QID) | ORAL | 0 refills | Status: DC | PRN

## 2017-09-15 NOTE — ED Notes (Signed)
Notified [facility] for transport 

## 2017-09-15 NOTE — ED Triage Notes (Signed)
Per [facility] pt is from [facility]. Patient c/o right arm pain. Patient states she had surgery about a month ago on her throat but was given a shot in her arm and it has not been the same since. Pt also has bilateral lower leg edema.

## 2017-09-15 NOTE — ED Notes (Signed)
Bed: WA06 Expected date:  Expected time:  Means of arrival:  Comments: 82 yo R arm edema

## 2017-09-15 NOTE — Discharge Instructions (Addendum)
Wear the shoulder immobilizer as needed for comfort.  Use heat on the sore area 3-4 times a day.

## 2017-09-15 NOTE — ED Provider Notes (Signed)
[place] DEPT Provider Note   CSN: [id number] Arrival date & time: 09/15/17  1110     History   Chief Complaint Chief Complaint  Patient presents with  . Arm Pain    HPI Kerri Hernandez is a 82 y.o. female.  Patient presents for evaluation of severe right shoulder pain with associated neck pain.  Onset of pain 2 days ago without known trauma.  Pain is worse with movement of her right neck as well as movement of the right shoulder.  She denies shortness of breath, cough, weakness or dizziness.  She is taking her usual medications, without relief.  HPI  Past Medical History:  Diagnosis Date  . Arthritis   . Asthma   . Cancer ([place])   . CHF (congestive heart failure) ([place])   . Hypertension   . TIA (transient ischemic attack)     Patient Active Problem List   Diagnosis Date Noted  . Pharyngeal foreign body 08/09/2017  . Pressure injury of skin 04/30/2017  . Chest pain 04/29/2017  . Hypertension 04/29/2017  . Aortic valve regurgitation     Past Surgical History:  Procedure Laterality Date  . ABDOMINAL HYSTERECTOMY    . DIRECT LARYNGOSCOPY N/A 08/09/2017   Procedure: DIRECT  laryngoscopy and rigid esophagoscopy;  Surgeon: Melida Quitter, MD;  Location: [place];  Service: ENT;  Laterality: N/A;  . MASTECTOMY       OB History   None      Home Medications    Prior to Admission medications   Medication Sig Start Date End Date Taking? Authorizing Provider  acetaminophen (TYLENOL) 500 MG tablet Take 2 tablets (1,000 mg total) by mouth every 6 (six) hours as needed. 07/31/17   Charlesetta Shanks, MD  amLODipine (NORVASC) 10 MG tablet Take 1 tablet (10 mg total) daily by mouth. 05/01/17   Burgess Estelle, MD  ARIPiprazole (ABILIFY) 30 MG tablet Take 30 mg by mouth daily.    [provider]  Cholecalciferol (VITAMIN D PO) Take 1 tablet by mouth daily.    [provider]  ferrous sulfate 325 (65 FE) MG tablet Take 325 mg by  mouth daily.    [provider]  fluticasone (FLONASE) 50 MCG/ACT nasal spray Place 2 sprays into both nostrils daily.     [provider]  furosemide (LASIX) 20 MG tablet Take 20 mg daily by mouth.    [provider]  hydrALAZINE (APRESOLINE) 10 MG tablet Take 1 tablet (10 mg total) by mouth 3 (three) times daily. To use as needed if patient's blood pressure remains greater than 150/80 when taking regular medications as prescribed. 07/31/17   Charlesetta Shanks, MD  losartan (COZAAR) 100 MG tablet Take 100 mg by mouth daily.    [provider]  Multiple Vitamin (MULTIVITAMIN WITH MINERALS) TABS tablet Take 1 tablet by mouth daily.    [provider]  pantoprazole (PROTONIX) 40 MG tablet Take 40 mg by mouth daily.    [provider]  potassium chloride SA (K-DUR,KLOR-CON) 20 MEQ tablet Take 1 tablet (20 mEq total) by mouth 2 (two) times daily. 0/09/23   Delora Fuel, MD  venlafaxine XR (EFFEXOR-XR) 150 MG 24 hr capsule Take 150 mg by mouth daily with breakfast.    [provider]  Vitamins C E (VITAMIN C & E COMPLEX PO) Take 1 tablet by mouth daily.    [provider]    Family History Family History  Problem Relation Age of Onset  .  Heart attack Father     Social History Social History   Tobacco Use  . Smoking status: Former Research scientist (life sciences)  . Smokeless tobacco: Never Used  . Tobacco comment: As a teenager.  Substance Use Topics  . Alcohol use: No    Comment: Occasional wine in the past  . Drug use: No     Allergies   Influenza vaccines; Aspirin; Penicillins; and Sulfa antibiotics   Review of Systems Review of Systems  All other systems reviewed and are negative.    Physical Exam Updated Vital Signs BP (!) 186/92 (BP Location: Left Arm)   Pulse (!) 49   Temp 99.2 F (37.3 C) (Oral)   Resp 20   SpO2 100%   Physical Exam  Constitutional: She is oriented to person, place, and time. She appears well-developed.  She appears distressed (Calling out in pain frequently).  Elderly, frail, appears under nourished  HENT:  Head: Normocephalic and atraumatic.  Eyes: Pupils are equal, round, and reactive to light. Conjunctivae and EOM are normal.  Neck: Normal range of motion and phonation normal. Neck supple.  Cardiovascular: Normal rate and regular rhythm.  Pulmonary/Chest: Effort normal and breath sounds normal. She exhibits no tenderness (Status post right mastectomy, no chest wall tenderness to palpation).  Abdominal: Soft. She exhibits no distension. There is no tenderness. There is no guarding.  Musculoskeletal:  She avoids moving the right shoulder secondary to pain in the right glenohumeral region.  She resists moving neck secondary to right-sided neck pain.  Moderate tenderness palpation over the right trapezius on the right posterior shoulder.  Neurovascular intact distally in the right hand.  Neurological: She is alert and oriented to person, place, and time. She exhibits normal muscle tone.  Skin: Skin is warm and dry.  Psychiatric: She has a normal mood and affect. Her behavior is normal. Judgment and thought content normal.  Nursing note and vitals reviewed.    ED Treatments / Results  Labs (all labs ordered are listed, but only abnormal results are displayed) Labs Reviewed - No data to display  EKG None  Radiology Dg Chest [facility] 1 View  Result Date: 09/15/2017 CLINICAL DATA:  Right arm pain EXAM: PORTABLE CHEST 1 VIEW COMPARISON:  08/13/2017 FINDINGS: There is moderate cardiac enlargement. No pleural effusion identified. No airspace opacity identified. IMPRESSION: 1. No acute findings. 2. Cardiac enlargement. Electronically Signed   By: Kerby Moors M.D.   On: 09/15/2017 12:21    Procedures Procedures (including critical care time)  Medications Ordered in ED Medications  0.9 %  sodium chloride infusion ( Intravenous New Bag/Given 09/15/17 1136)  fentaNYL (SUBLIMAZE) injection 25  mcg (25 mcg Intravenous Given 09/15/17 1136)  ondansetron (ZOFRAN) injection 4 mg (4 mg Intravenous Given 09/15/17 1136)     Initial Impression / Assessment and Plan / ED Course  I have reviewed the triage vital signs and the nursing notes.  Pertinent labs & imaging results that were available during my care of the patient were reviewed by me and considered in my medical decision making (see chart for details).      Patient Vitals for the past 24 hrs:  BP Temp Temp src Pulse Resp SpO2  09/15/17 1124 (!) 186/92 99.2 F (37.3 C) Oral (!) 49 20 100 %  09/15/17 1120 - - - - - 96 %    2:34 PM Reevaluation with update and discussion. After initial assessment and treatment, an updated evaluation reveals she reports that her pain is better at this  time.  She has been treated with a shoulder immobilizer.  Findings discussed with the patient and all questions were answered. [place] decision making.  Patient's pain is likely secondary to degenerative changes of the glenohumeral joint.  Both cervical radiculopathy or myelopathy.  Doubt fracture.  Doubt septic arthritis.  Nursing Notes Reviewed/ Care Coordinated Applicable Imaging Reviewed Interpretation of Laboratory Data incorporated into ED treatment  The patient appears reasonably screened and/or stabilized for discharge and I doubt any other medical condition or other [facility] requiring further screening, evaluation, or treatment in the ED at this time prior to discharge.  Plan: Home Medications-continue current medications; Home Treatments-shoulder immobilizer as needed for comfort, heat therapy; return here if the recommended treatment, does not improve the symptoms; Recommended follow up-PCP as needed    Final Clinical Impressions(s) / ED Diagnoses   Final diagnoses:  None    ED Discharge Orders    None       Daleen Bo, MD 09/16/17 [phone]

## 2017-09-28 ENCOUNTER — Emergency Department (HOSPITAL_COMMUNITY): Payer: Medicare Other

## 2017-09-28 ENCOUNTER — Emergency Department (HOSPITAL_COMMUNITY)
Admission: EM | Admit: 2017-09-28 | Discharge: 2017-09-28 | Disposition: A | Discharge status: Home / Self Care | Payer: Medicare Other | Attending: Emergency Medicine | Admitting: Emergency Medicine

## 2017-09-28 ENCOUNTER — Other Ambulatory Visit: Payer: Self-pay

## 2017-09-28 ENCOUNTER — Encounter (HOSPITAL_COMMUNITY): Payer: Self-pay | Admitting: Emergency Medicine

## 2017-09-28 DIAGNOSIS — R1084 Generalized abdominal pain: Secondary | ICD-10-CM | POA: Insufficient documentation

## 2017-09-28 DIAGNOSIS — I110 Hypertensive heart disease with heart failure: Secondary | ICD-10-CM | POA: Diagnosis not present

## 2017-09-28 DIAGNOSIS — R1031 Right lower quadrant pain: Secondary | ICD-10-CM | POA: Insufficient documentation

## 2017-09-28 DIAGNOSIS — I509 Heart failure, unspecified: Secondary | ICD-10-CM | POA: Diagnosis not present

## 2017-09-28 DIAGNOSIS — Z9010 Acquired absence of unspecified breast and nipple: Secondary | ICD-10-CM | POA: Diagnosis not present

## 2017-09-28 DIAGNOSIS — K5900 Constipation, unspecified: Secondary | ICD-10-CM | POA: Diagnosis not present

## 2017-09-28 DIAGNOSIS — R140 Abdominal distension (gaseous): Secondary | ICD-10-CM | POA: Insufficient documentation

## 2017-09-28 DIAGNOSIS — R600 Localized edema: Secondary | ICD-10-CM | POA: Insufficient documentation

## 2017-09-28 DIAGNOSIS — Z79899 Other long term (current) drug therapy: Secondary | ICD-10-CM | POA: Diagnosis not present

## 2017-09-28 DIAGNOSIS — Z87891 Personal history of nicotine dependence: Secondary | ICD-10-CM | POA: Insufficient documentation

## 2017-09-28 DIAGNOSIS — J45909 Unspecified asthma, uncomplicated: Secondary | ICD-10-CM | POA: Insufficient documentation

## 2017-09-28 DIAGNOSIS — Z8673 Personal history of transient ischemic attack (TIA), and cerebral infarction without residual deficits: Secondary | ICD-10-CM | POA: Diagnosis not present

## 2017-09-28 DIAGNOSIS — R112 Nausea with vomiting, unspecified: Secondary | ICD-10-CM | POA: Insufficient documentation

## 2017-09-28 LAB — COMPREHENSIVE METABOLIC PANEL
ALT: 13 U/L — ABNORMAL LOW (ref 14–54)
ANION GAP: 10 (ref 5–15)
AST: 21 U/L (ref 15–41)
Albumin: 3.7 g/dL (ref 3.5–5.0)
Alkaline Phosphatase: 76 U/L (ref 38–126)
BILIRUBIN TOTAL: 0.7 mg/dL (ref 0.3–1.2)
BUN: 20 mg/dL (ref 6–20)
CHLORIDE: 101 mmol/L (ref 101–111)
CO2: 29 mmol/L (ref 22–32)
Calcium: 9.1 mg/dL (ref 8.9–10.3)
Creatinine, Ser: 1.08 mg/dL — ABNORMAL HIGH (ref 0.44–1.00)
GFR calc Af Amer: 55 mL/min — ABNORMAL LOW (ref >60)
GFR, EST NON AFRICAN AMERICAN: 48 mL/min — AB (ref >60)
Glucose, Bld: 125 mg/dL — ABNORMAL HIGH (ref 65–99)
POTASSIUM: 3.7 mmol/L (ref 3.5–5.1)
Sodium: 140 mmol/L (ref 135–145)
TOTAL PROTEIN: 7.8 g/dL (ref 6.5–8.1)

## 2017-09-28 LAB — CBC WITH DIFFERENTIAL/PLATELET
BASOS ABS: 0 10*3/uL (ref 0.0–0.1)
Basophils Relative: 0 %
EOS PCT: 1 %
Eosinophils Absolute: 0.1 10*3/uL (ref 0.0–0.7)
HCT: 34.3 % — ABNORMAL LOW (ref 36.0–46.0)
Hemoglobin: 11.1 g/dL — ABNORMAL LOW (ref 12.0–15.0)
Lymphocytes Relative: 8 %
Lymphs Abs: 0.7 10*3/uL (ref 0.7–4.0)
MCH: 28.2 pg (ref 26.0–34.0)
MCHC: 32.4 g/dL (ref 30.0–36.0)
MCV: 87.1 fL (ref 78.0–100.0)
MONO ABS: 0.8 10*3/uL (ref 0.1–1.0)
MONOS PCT: 9 %
Neutro Abs: 7.2 10*3/uL (ref 1.7–7.7)
Neutrophils Relative %: 82 %
PLATELETS: 275 10*3/uL (ref 150–400)
RBC: 3.94 MIL/uL (ref 3.87–5.11)
RDW: 15.3 % (ref 11.5–15.5)
WBC: 8.8 10*3/uL (ref 4.0–10.5)

## 2017-09-28 LAB — LIPASE, BLOOD: LIPASE: 32 U/L (ref 11–51)

## 2017-09-28 MED ORDER — DOCUSATE SODIUM 100 MG PO CAPS: 100.0000 mg | ORAL_CAPSULE | Freq: Two times a day (BID) | ORAL | 0 refills | Status: AC

## 2017-09-28 MED ORDER — MAGNESIUM CITRATE PO SOLN: 1.0000 | Freq: Once | ORAL | Status: DC

## 2017-09-28 MED ORDER — MORPHINE SULFATE (PF) 4 MG/ML IV SOLN
4.0000 mg | Freq: Once | INTRAVENOUS | Status: AC
  Administered 2017-09-28: 4 mg via INTRAVENOUS
  Filled 2017-09-28: qty 1

## 2017-09-28 MED ORDER — MAGNESIUM CITRATE PO SOLN
0.5000 | Freq: Once | ORAL | Status: AC
  Administered 2017-09-28: 0.5 via ORAL
  Filled 2017-09-28: qty 296

## 2017-09-28 MED ORDER — IOPAMIDOL (ISOVUE-300) INJECTION 61%
100.0000 mL | Freq: Once | INTRAVENOUS | Status: AC | PRN
  Administered 2017-09-28: 100 mL via INTRAVENOUS

## 2017-09-28 MED ORDER — ONDANSETRON HCL 4 MG/2ML IJ SOLN
4.0000 mg | Freq: Once | INTRAMUSCULAR | Status: AC
  Administered 2017-09-28: 4 mg via INTRAVENOUS
  Filled 2017-09-28: qty 2

## 2017-09-28 NOTE — ED Notes (Signed)
Pt vomited small amount before leaving for CT.

## 2017-09-28 NOTE — ED Triage Notes (Signed)
Per EMS RLQ pain N/V x 4 hours. Abd distention and bilateral edema. No constipation. Facility states that she's just been spitting and not vomiting and that she has a hx of depression and calls EMS. Her abdomen is visually distended today however. Patient from [facility].

## 2017-09-28 NOTE — Discharge Instructions (Addendum)
Magnesium citrate.  You were given half dose tonight.  Take half dose tomorrow if not producing stool.  Daily Colace.  Increase fluid intake.

## 2017-09-28 NOTE — ED Notes (Signed)
[facility] called for transport.  

## 2017-09-28 NOTE — ED Notes (Signed)
Bed: UV75 Expected date:  Expected time:  Means of arrival:  Comments: 82 yo abd pain

## 2017-09-28 NOTE — ED Provider Notes (Signed)
[facility] DEPT Provider Note   CSN: [id number] Arrival date & time: 09/28/17  1627     History   Chief Complaint Chief Complaint  Patient presents with  . Abdominal Pain    HPI Kerri Hernandez is a 82 y.o. female. CC: Abdominal pain after eating meat and potatoes.  HPI: [age]-year-old female.  States she had meat and potatoes today and "my stomach so swollen".  Complains of pain.  Felt nauseated.  Had a bowel movement this morning.  Past surgical history only of hysterectomy.  No other extensive laparotomy.  No fever.  No recent infection symptoms.  No urinary symptoms.    Past Medical History:  Diagnosis Date  . Arthritis   . Asthma   . Cancer ([place])   . CHF (congestive heart failure) ([place])   . Hypertension   . TIA (transient ischemic attack)     Patient Active Problem List   Diagnosis Date Noted  . Pharyngeal foreign body 08/09/2017  . Pressure injury of skin 04/30/2017  . Chest pain 04/29/2017  . Hypertension 04/29/2017  . Aortic valve regurgitation     Past Surgical History:  Procedure Laterality Date  . ABDOMINAL HYSTERECTOMY    . DIRECT LARYNGOSCOPY N/A 08/09/2017   Procedure: DIRECT  laryngoscopy and rigid esophagoscopy;  Surgeon: Melida Quitter, MD;  Location: [facility];  Service: ENT;  Laterality: N/A;  . MASTECTOMY       OB History   None      Home Medications    Prior to Admission medications   Medication Sig Start Date End Date Taking? Authorizing Provider  acetaminophen (TYLENOL) 500 MG tablet Take 2 tablets (1,000 mg total) by mouth every 6 (six) hours as needed. 07/31/17   Charlesetta Shanks, MD  amLODipine (NORVASC) 10 MG tablet Take 1 tablet (10 mg total) daily by mouth. 05/01/17   Burgess Estelle, MD  ARIPiprazole (ABILIFY) 30 MG tablet Take 30 mg by mouth daily.    [provider]  Cholecalciferol (VITAMIN D PO) Take 1 tablet by mouth daily.    [provider]  docusate sodium (COLACE) 100 MG  capsule Take 1 capsule (100 mg total) by mouth every 12 (twelve) hours. 09/28/17   Tanna Furry, MD  ferrous sulfate 325 (65 FE) MG tablet Take 325 mg by mouth daily.    [provider]  fluticasone (FLONASE) 50 MCG/ACT nasal spray Place 2 sprays into both nostrils daily.     [provider]  furosemide (LASIX) 20 MG tablet Take 20 mg daily by mouth.    [provider]  hydrALAZINE (APRESOLINE) 10 MG tablet Take 1 tablet (10 mg total) by mouth 3 (three) times daily. To use as needed if patient's blood pressure remains greater than 150/80 when taking regular medications as prescribed. 07/31/17   Charlesetta Shanks, MD  losartan (COZAAR) 100 MG tablet Take 100 mg by mouth daily.    [provider]  Multiple Vitamin (MULTIVITAMIN WITH MINERALS) TABS tablet Take 1 tablet by mouth daily.    [provider]  pantoprazole (PROTONIX) 40 MG tablet Take 40 mg by mouth daily.    [provider]  potassium chloride SA (K-DUR,KLOR-CON) 20 MEQ tablet Take 1 tablet (20 mEq total) by mouth 2 (two) times daily. 11/17/07   Delora Fuel, MD  traMADol-acetaminophen (ULTRACET) 37.5-325 MG tablet Take 1 tablet by mouth every 6 (six) hours as needed. 09/15/17   Daleen Bo, MD  venlafaxine XR (EFFEXOR-XR) 150 MG 24 hr  capsule Take 150 mg by mouth daily with breakfast.    [provider]  Vitamins C E (VITAMIN C & E COMPLEX PO) Take 1 tablet by mouth daily.    [provider]    Family History Family History  Problem Relation Age of Onset  . Heart attack Father     Social History Social History   Tobacco Use  . Smoking status: Former Research scientist (life sciences)  . Smokeless tobacco: Never Used  . Tobacco comment: As a teenager.  Substance Use Topics  . Alcohol use: No    Comment: Occasional wine in the past  . Drug use: No     Allergies   Influenza vaccines; Aspirin; Penicillins; and Sulfa antibiotics   Review of Systems Review of Systems  Constitutional:  Negative for appetite change, chills, diaphoresis, fatigue and fever.  HENT: Negative for mouth sores, sore throat and trouble swallowing.   Eyes: Negative for visual disturbance.  Respiratory: Negative for cough, chest tightness, shortness of breath and wheezing.   Cardiovascular: Negative for chest pain.  Gastrointestinal: Positive for abdominal pain, constipation and nausea. Negative for abdominal distention, diarrhea and vomiting.  Endocrine: Negative for polydipsia, polyphagia and polyuria.  Genitourinary: Negative for dysuria, frequency and hematuria.  Musculoskeletal: Negative for gait problem.  Skin: Negative for color change, pallor and rash.  Neurological: Negative for dizziness, syncope, light-headedness and headaches.  Hematological: Does not bruise/bleed easily.  Psychiatric/Behavioral: Negative for behavioral problems and confusion.     Physical Exam Updated Vital Signs BP (!) 187/90 (BP Location: Left Arm)   Pulse 60   Temp 98.6 F (37 C) (Oral)   Resp 17   SpO2 98%   Physical Exam  Constitutional: She is oriented to person, place, and time. She appears well-developed and well-nourished. No distress.  HENT:  Head: Normocephalic.  Eyes: Pupils are equal, round, and reactive to light. Conjunctivae are normal. No scleral icterus.  Neck: Normal range of motion. Neck supple. No thyromegaly present.  Cardiovascular: Normal rate and regular rhythm. Exam reveals no gallop and no friction rub.  No murmur heard. Pulmonary/Chest: Effort normal and breath sounds normal. No respiratory distress. She has no wheezes. She has no rales.  Abdominal: Soft. Bowel sounds are normal. She exhibits no distension. There is no tenderness. There is no rebound.  Been soft.  Slightly distended but no focal tenderness.  Present but hypoactive bowel sounds.  No hernia.  Musculoskeletal: Normal range of motion.  Neurological: She is alert and oriented to person, place, and time.  Skin: Skin is  warm and dry. No rash noted.  Psychiatric: She has a normal mood and affect. Her behavior is normal.     ED Treatments / Results  Labs (all labs ordered are listed, but only abnormal results are displayed) Labs Reviewed  CBC WITH DIFFERENTIAL/PLATELET - Abnormal; Notable for the following components:      Result Value   Hemoglobin 11.1 (*)    HCT 34.3 (*)    All other components within normal limits  COMPREHENSIVE METABOLIC PANEL - Abnormal; Notable for the following components:   Glucose, Bld 125 (*)    Creatinine, Ser 1.08 (*)    ALT 13 (*)    GFR calc non Af Amer 48 (*)    GFR calc Af Amer 55 (*)    All other components within normal limits  LIPASE, BLOOD    EKG None  Radiology Ct Abdomen Pelvis W Contrast  Result Date: 09/28/2017 CLINICAL DATA:  Right lower quadrant  abdominal pain, nausea and vomiting. Abdominal distension. EXAM: CT ABDOMEN AND PELVIS WITH CONTRAST TECHNIQUE: Multidetector CT imaging of the abdomen and pelvis was performed using the standard protocol following bolus administration of intravenous contrast. CONTRAST:  151mL ISOVUE-300 IOPAMIDOL (ISOVUE-300) INJECTION 61% COMPARISON:  None. FINDINGS: Lower chest: No significant pulmonary nodules or acute consolidative airspace disease. Mild cardiomegaly. Right mastectomy. Hepatobiliary: Normal liver size. No liver mass. Normal gallbladder with no radiopaque cholelithiasis. No biliary ductal dilatation. Pancreas: Normal, with no mass or duct dilation. Spleen: Normal size. No mass. Adrenals/Urinary Tract: Normal adrenals. Subcentimeter hypodense renal cortical lesions in interpolar right kidney and mid to upper left kidney are too small to characterize and require no follow-up. No hydronephrosis. Normal bladder. Stomach/Bowel: Normal non-distended stomach. Normal caliber small bowel with no small bowel wall thickening. Appendix not discretely visualized. No pericecal inflammatory changes. Large stool volume throughout  the colon. No large bowel wall thickening or significant pericolonic fat stranding. Vascular/Lymphatic: Atherosclerotic nonaneurysmal abdominal aorta. Patent portal, splenic, hepatic and renal veins. No pathologically enlarged lymph nodes in the abdomen or pelvis. Reproductive: Status post hysterectomy, with no abnormal findings at the vaginal cuff. No adnexal mass. Other: No pneumoperitoneum, ascites or focal fluid collection. Musculoskeletal: No aggressive appearing focal osseous lesions. Marked lumbar spondylosis. IMPRESSION: 1. No evidence of bowel obstruction or acute bowel inflammation. 2. Large colonic stool volume, suggesting constipation. 3. Mild cardiomegaly. 4.  Aortic Atherosclerosis (ICD10-I70.0). Electronically Signed   By: Ilona Sorrel M.D.   On: 09/28/2017 19:07    Procedures Procedures (including critical care time)  Medications Ordered in ED Medications  magnesium citrate solution 1 Bottle (has no administration in time range)  ondansetron (ZOFRAN) injection 4 mg (4 mg Intravenous Given 09/28/17 1730)  morphine 4 MG/ML injection 4 mg (4 mg Intravenous Given 09/28/17 1730)  iopamidol (ISOVUE-300) 61 % injection 100 mL (100 mLs Intravenous Contrast Given 09/28/17 1834)     Initial Impression / Assessment and Plan / ED Course  I have reviewed the triage vital signs and the nursing notes.  Pertinent labs & imaging results that were available during my care of the patient were reviewed by me and considered in my medical decision making (see chart for details).   Labs are reassuring.  No leukocytosis.  Normal hepatobiliary enzymes.  CT scan shows constipation but no obstructive pattern.  No perforation or free fluid.  Discussed with patient.  Had been given morphine and Zofran.  Her symptoms have improved she has been here several hours.  She been resting comfortably.  She is taking some p.o. liquids without difficulty.  Plan mag citrate.  Discharge home.  Daily Colace.  Primary care  follow-up.  Final Clinical Impressions(s) / ED Diagnoses   Final diagnoses:  Generalized abdominal pain  Constipation, unspecified constipation type    ED Discharge Orders        Ordered    docusate sodium (COLACE) 100 MG capsule  Every 12 hours     09/28/17 2046       Tanna Furry, MD 09/28/17 2047

## 2017-09-28 NOTE — ED Notes (Signed)
Pt aware urine sample is needed, states fluids are needed first.

## 2017-10-06 ENCOUNTER — Encounter (HOSPITAL_COMMUNITY): Payer: Self-pay | Admitting: *Deleted

## 2017-10-06 ENCOUNTER — Emergency Department (HOSPITAL_COMMUNITY)
Admission: EM | Admit: 2017-10-06 | Discharge: 2017-10-07 | Disposition: A | Discharge status: Home / Self Care | Payer: Medicare Other | Attending: Emergency Medicine | Admitting: Emergency Medicine

## 2017-10-06 ENCOUNTER — Emergency Department (HOSPITAL_COMMUNITY): Payer: Medicare Other

## 2017-10-06 ENCOUNTER — Other Ambulatory Visit: Payer: Self-pay

## 2017-10-06 DIAGNOSIS — W228XXA Striking against or struck by other objects, initial encounter: Secondary | ICD-10-CM | POA: Insufficient documentation

## 2017-10-06 DIAGNOSIS — Y999 Unspecified external cause status: Secondary | ICD-10-CM | POA: Diagnosis not present

## 2017-10-06 DIAGNOSIS — Z87891 Personal history of nicotine dependence: Secondary | ICD-10-CM | POA: Diagnosis not present

## 2017-10-06 DIAGNOSIS — Y929 Unspecified place or not applicable: Secondary | ICD-10-CM | POA: Diagnosis not present

## 2017-10-06 DIAGNOSIS — S8002XA Contusion of left knee, initial encounter: Secondary | ICD-10-CM | POA: Diagnosis not present

## 2017-10-06 DIAGNOSIS — I110 Hypertensive heart disease with heart failure: Secondary | ICD-10-CM | POA: Diagnosis not present

## 2017-10-06 DIAGNOSIS — Z859 Personal history of malignant neoplasm, unspecified: Secondary | ICD-10-CM | POA: Insufficient documentation

## 2017-10-06 DIAGNOSIS — Z79899 Other long term (current) drug therapy: Secondary | ICD-10-CM | POA: Diagnosis not present

## 2017-10-06 DIAGNOSIS — S8992XA Unspecified injury of left lower leg, initial encounter: Secondary | ICD-10-CM | POA: Diagnosis present

## 2017-10-06 DIAGNOSIS — J45909 Unspecified asthma, uncomplicated: Secondary | ICD-10-CM | POA: Diagnosis not present

## 2017-10-06 DIAGNOSIS — M25562 Pain in left knee: Secondary | ICD-10-CM

## 2017-10-06 DIAGNOSIS — Y9389 Activity, other specified: Secondary | ICD-10-CM | POA: Insufficient documentation

## 2017-10-06 DIAGNOSIS — I509 Heart failure, unspecified: Secondary | ICD-10-CM | POA: Insufficient documentation

## 2017-10-06 NOTE — ED Triage Notes (Signed)
Per [facility], pt hit left knee on car door in the parking lot today, has taken unknown quantity of ibuprofen without relief.  Has not taken her b/p meds today.

## 2017-10-06 NOTE — ED Notes (Signed)
Bed: WTR7 Expected date:  Expected time:  Means of arrival:  Comments: 

## 2017-10-06 NOTE — ED Triage Notes (Signed)
Pt stated "I hit my knee on the car door and it feels real puffy."

## 2017-10-07 DIAGNOSIS — S8002XA Contusion of left knee, initial encounter: Secondary | ICD-10-CM | POA: Diagnosis not present

## 2017-10-07 MED ORDER — METOPROLOL TARTRATE 25 MG PO TABS
25.0000 mg | ORAL_TABLET | Freq: Once | ORAL | Status: AC
  Administered 2017-10-07: 25 mg via ORAL
  Filled 2017-10-07: qty 1

## 2017-10-07 MED ORDER — CLONIDINE HCL 0.1 MG PO TABS
0.2000 mg | ORAL_TABLET | Freq: Once | ORAL | Status: AC
  Administered 2017-10-07: 0.2 mg via ORAL
  Filled 2017-10-07: qty 2

## 2017-10-07 NOTE — ED Notes (Signed)
Ptar attendants called [facility] who stated pt would get her meds around 0530.  Dr. Venora Maples informed & has ordered meds.

## 2017-10-07 NOTE — ED Provider Notes (Signed)
[facility] DEPT Provider Note   CSN: [id number] Arrival date & time: 10/06/17  2037     History   Chief Complaint Chief Complaint  Patient presents with  . Knee Pain    left    HPI Kerri Hernandez is a 82 y.o. female.  HPI Patient reports injuring her left knee today when her knee struck a parked car door.  She has been able to walk on her left knee.  She states it hurts on the anterior lateral portion of her knee.  She denies numbness or tingling.  Pain is worse with range of motion and palpation of her left knee.  Pain is moderate in severity.  Nothing improves her pain.  She is tried OTC medicines without improvement   Past Medical History:  Diagnosis Date  . Arthritis   . Asthma   . Cancer ([place])   . CHF (congestive heart failure) ([place])   . Hypertension   . TIA (transient ischemic attack)     Patient Active Problem List   Diagnosis Date Noted  . Pharyngeal foreign body 08/09/2017  . Pressure injury of skin 04/30/2017  . Chest pain 04/29/2017  . Hypertension 04/29/2017  . Aortic valve regurgitation     Past Surgical History:  Procedure Laterality Date  . ABDOMINAL HYSTERECTOMY    . DIRECT LARYNGOSCOPY N/A 08/09/2017   Procedure: DIRECT  laryngoscopy and rigid esophagoscopy;  Surgeon: Melida Quitter, MD;  Location: [facility];  Service: ENT;  Laterality: N/A;  . MASTECTOMY       OB History   None      Home Medications    Prior to Admission medications   Medication Sig Start Date End Date Taking? Authorizing Provider  acetaminophen (TYLENOL) 500 MG tablet Take 2 tablets (1,000 mg total) by mouth every 6 (six) hours as needed. 07/31/17   Charlesetta Shanks, MD  amLODipine (NORVASC) 10 MG tablet Take 1 tablet (10 mg total) daily by mouth. 05/01/17   Burgess Estelle, MD  ARIPiprazole (ABILIFY) 30 MG tablet Take 30 mg by mouth daily.    [provider]  Cholecalciferol (VITAMIN D PO) Take 1 tablet by mouth daily.    [provider]  docusate sodium (COLACE) 100 MG capsule Take 1 capsule (100 mg total) by mouth every 12 (twelve) hours. 09/28/17   Tanna Furry, MD  ferrous sulfate 325 (65 FE) MG tablet Take 325 mg by mouth daily.    [provider]  fluticasone (FLONASE) 50 MCG/ACT nasal spray Place 2 sprays into both nostrils daily.     [provider]  furosemide (LASIX) 20 MG tablet Take 20 mg daily by mouth.    [provider]  hydrALAZINE (APRESOLINE) 10 MG tablet Take 1 tablet (10 mg total) by mouth 3 (three) times daily. To use as needed if patient's blood pressure remains greater than 150/80 when taking regular medications as prescribed. 07/31/17   Charlesetta Shanks, MD  losartan (COZAAR) 100 MG tablet Take 100 mg by mouth daily.    [provider]  Multiple Vitamin (MULTIVITAMIN WITH MINERALS) TABS tablet Take 1 tablet by mouth daily.    [provider]  pantoprazole (PROTONIX) 40 MG tablet Take 40 mg by mouth daily.    [provider]  potassium chloride SA (K-DUR,KLOR-CON) 20 MEQ tablet Take 1 tablet (20 mEq total) by mouth 2 (two) times daily. 4/69/62   Delora Fuel, MD  traMADol-acetaminophen (ULTRACET) 37.5-325 MG tablet Take 1 tablet by mouth every  6 (six) hours as needed. 09/15/17   Daleen Bo, MD  venlafaxine XR (EFFEXOR-XR) 150 MG 24 hr capsule Take 150 mg by mouth daily with breakfast.    [provider]  Vitamins C E (VITAMIN C & E COMPLEX PO) Take 1 tablet by mouth daily.    [provider]    Family History Family History  Problem Relation Age of Onset  . Heart attack Father     Social History Social History   Tobacco Use  . Smoking status: Former Research scientist (life sciences)  . Smokeless tobacco: Never Used  . Tobacco comment: As a teenager.  Substance Use Topics  . Alcohol use: No    Comment: Occasional wine in the past  . Drug use: No     Allergies   Influenza vaccines; Aspirin; Penicillins; and Sulfa  antibiotics   Review of Systems Review of Systems  All other systems reviewed and are negative.    Physical Exam Updated Vital Signs BP (!) 220/100 (BP Location: Left Arm)   Pulse (!) 49   Temp 98.7 F (37.1 C) (Oral)   Resp 18   Ht 5\' 3"  (1.6 m)   Wt 59.9 kg (132 lb)   SpO2 100%   BMI 23.38 kg/m   Physical Exam  Constitutional: She is oriented to person, place, and time. She appears well-developed and well-nourished.  HENT:  Head: Normocephalic.  Eyes: EOM are normal.  Neck: Normal range of motion.  Pulmonary/Chest: Effort normal.  Abdominal: She exhibits no distension.  Musculoskeletal: Normal range of motion.  Full range of motion of bilateral hips, knees, ankles.  Mild tenderness of the left lateral knee joint.  No obvious effusion.  No erythema.  No significant bruising noted  Neurological: She is alert and oriented to person, place, and time.  Psychiatric: She has a normal mood and affect.  Nursing note and vitals reviewed.    ED Treatments / Results  Labs (all labs ordered are listed, but only abnormal results are displayed) Labs Reviewed - No data to display  EKG None  Radiology Dg Knee Complete 4 Views Left  Result Date: 10/06/2017 CLINICAL DATA:  Blunt trauma left knee with pain, initial encounter EXAM: LEFT KNEE - COMPLETE 4+ VIEW COMPARISON:  None. FINDINGS: Tricompartmental degenerative changes are noted. Small joint effusion is seen. Mild remodeling of the proximal tibia is noted. No acute fracture or dislocation is noted. IMPRESSION: Degenerative changes are noted without acute abnormality. Electronically Signed   By: Inez Catalina M.D.   On: 10/06/2017 22:27    Procedures Procedures (including critical care time)  Medications Ordered in ED Medications  cloNIDine (CATAPRES) tablet 0.2 mg (0.2 mg Oral Given 10/07/17 0035)  metoprolol tartrate (LOPRESSOR) tablet 25 mg (25 mg Oral Given 10/07/17 0035)     Initial Impression / Assessment and Plan  / ED Course  I have reviewed the triage vital signs and the nursing notes.  Pertinent labs & imaging results that were available during my care of the patient were reviewed by me and considered in my medical decision making (see chart for details).     Contusion with out fracture noted on x-ray.  X-ray consistent with arthritis.  Patient ambulatory.  Patient be discharged home in good condition.  Prior to discharge as told the patient's blood pressure was elevated.  She states she has not taken her home blood pressure medications tonight.  She denies headache.  She denies chest pain shortness of breath.  She has no other complaints.  She will be given some oral blood pressure medications and sent back to her facility to receive her nighttime meds.  Final Clinical Impressions(s) / ED Diagnoses   Final diagnoses:  Contusion of left knee, initial encounter  Acute pain of left knee    ED Discharge Orders    None       Jola Schmidt, MD 10/07/17 [phone]

## 2017-10-07 NOTE — ED Notes (Signed)
Dr. Venora Maples informed of pt's b/p.  No new orders received.  Per Dr. Venora Maples, pt needs to take home b/p meds.

## 2017-10-26 ENCOUNTER — Other Ambulatory Visit: Payer: Self-pay

## 2017-10-26 ENCOUNTER — Emergency Department (HOSPITAL_COMMUNITY)
Admission: EM | Admit: 2017-10-26 | Discharge: 2017-10-26 | Disposition: A | Discharge status: Skilled Nursing Facility | Payer: Medicare Other | Attending: Emergency Medicine | Admitting: Emergency Medicine

## 2017-10-26 ENCOUNTER — Emergency Department (HOSPITAL_COMMUNITY): Payer: Medicare Other

## 2017-10-26 ENCOUNTER — Encounter (HOSPITAL_COMMUNITY): Payer: Self-pay | Admitting: Emergency Medicine

## 2017-10-26 DIAGNOSIS — R251 Tremor, unspecified: Secondary | ICD-10-CM | POA: Diagnosis not present

## 2017-10-26 DIAGNOSIS — I10 Essential (primary) hypertension: Secondary | ICD-10-CM | POA: Insufficient documentation

## 2017-10-26 DIAGNOSIS — I509 Heart failure, unspecified: Secondary | ICD-10-CM | POA: Diagnosis not present

## 2017-10-26 DIAGNOSIS — R51 Headache: Secondary | ICD-10-CM | POA: Insufficient documentation

## 2017-10-26 DIAGNOSIS — Z87891 Personal history of nicotine dependence: Secondary | ICD-10-CM | POA: Diagnosis not present

## 2017-10-26 DIAGNOSIS — H538 Other visual disturbances: Secondary | ICD-10-CM | POA: Diagnosis not present

## 2017-10-26 DIAGNOSIS — Z79899 Other long term (current) drug therapy: Secondary | ICD-10-CM | POA: Insufficient documentation

## 2017-10-26 DIAGNOSIS — Z853 Personal history of malignant neoplasm of breast: Secondary | ICD-10-CM | POA: Insufficient documentation

## 2017-10-26 DIAGNOSIS — R519 Headache, unspecified: Secondary | ICD-10-CM

## 2017-10-26 LAB — CBC WITH DIFFERENTIAL/PLATELET
BASOS PCT: 1 %
Basophils Absolute: 0 10*3/uL (ref 0.0–0.1)
EOS ABS: 0.2 10*3/uL (ref 0.0–0.7)
Eosinophils Relative: 3 %
HEMATOCRIT: 31.7 % — AB (ref 36.0–46.0)
Hemoglobin: 10.1 g/dL — ABNORMAL LOW (ref 12.0–15.0)
LYMPHS ABS: 2.2 10*3/uL (ref 0.7–4.0)
Lymphocytes Relative: 34 %
MCH: 27.8 pg (ref 26.0–34.0)
MCHC: 31.9 g/dL (ref 30.0–36.0)
MCV: 87.3 fL (ref 78.0–100.0)
MONO ABS: 0.6 10*3/uL (ref 0.1–1.0)
MONOS PCT: 10 %
NEUTROS ABS: 3.4 10*3/uL (ref 1.7–7.7)
NEUTROS PCT: 52 %
Platelets: 219 10*3/uL (ref 150–400)
RBC: 3.63 MIL/uL — ABNORMAL LOW (ref 3.87–5.11)
RDW: 16 % — AB (ref 11.5–15.5)
WBC: 6.5 10*3/uL (ref 4.0–10.5)

## 2017-10-26 LAB — BASIC METABOLIC PANEL
Anion gap: 10 (ref 5–15)
BUN: 22 mg/dL — ABNORMAL HIGH (ref 6–20)
CHLORIDE: 100 mmol/L — AB (ref 101–111)
CO2: 26 mmol/L (ref 22–32)
CREATININE: 0.96 mg/dL (ref 0.44–1.00)
Calcium: 8.9 mg/dL (ref 8.9–10.3)
GFR calc non Af Amer: 55 mL/min — ABNORMAL LOW (ref >60)
GLUCOSE: 97 mg/dL (ref 65–99)
Potassium: 3.5 mmol/L (ref 3.5–5.1)
Sodium: 136 mmol/L (ref 135–145)

## 2017-10-26 LAB — SEDIMENTATION RATE: Sed Rate: 28 mm/hr — ABNORMAL HIGH (ref 0–22)

## 2017-10-26 MED ORDER — KETOROLAC TROMETHAMINE 15 MG/ML IJ SOLN
15.0000 mg | Freq: Once | INTRAMUSCULAR | Status: AC
  Administered 2017-10-26: 15 mg via INTRAVENOUS
  Filled 2017-10-26: qty 1

## 2017-10-26 MED ORDER — METOCLOPRAMIDE HCL 5 MG/ML IJ SOLN
10.0000 mg | Freq: Once | INTRAMUSCULAR | Status: AC
  Administered 2017-10-26: 10 mg via INTRAVENOUS
  Filled 2017-10-26: qty 2

## 2017-10-26 NOTE — ED Notes (Signed)
[facility] was called ofr pt's transportation back to [facility].

## 2017-10-26 NOTE — ED Triage Notes (Signed)
Pt brought in by EMS from [facility] with c/o persistent headache, onset yesterday morning.  Pt reports that she has been taking Tylenol but without relief.  Pt now is seeing "spots" in her vision---- pt worried because she has hx of stroke.

## 2017-10-26 NOTE — Discharge Instructions (Signed)
We saw in the ER for the headache and right-sided tremors. CT scan of your head and labs look normal. Fortunately, your headache is resolved with medications in the ER.  We recommend that he follow-up with neurologist for further work-up on the tremor and the headache. Return to the ER immediately if your symptoms get worse or you have new neurologic symptoms.Marland Kitchen

## 2017-10-26 NOTE — ED Provider Notes (Signed)
[facility] DEPT Provider Note   CSN: [id number] Arrival date & time: 10/26/17  0017     History   Chief Complaint Chief Complaint  Patient presents with  . Headache    HPI Kerri Hernandez is a 82 y.o. female.  HPI 82 year old with history of CHF, TIA, rheumatoid arthritis comes in with chief complaint of headache. Patient lives at an assisted living.  She reports that she started having headaches on Thursday morning.  Headaches have been constant and moderately severe.  Headaches are generalized and located over the vertex in the back of her head.  Patient also reports associated visual floaters bilaterally and right-sided upper extremity tremors.  Patient denies any constant dizziness, loss of vision or double vision, focal numbness or weakness.   Review of system is also positive for bilateral knee swelling and pain.  Past Medical History:  Diagnosis Date  . Arthritis   . Asthma   . Cancer ([place])   . CHF (congestive heart failure) ([place])   . Hypertension   . TIA (transient ischemic attack)     Patient Active Problem List   Diagnosis Date Noted  . Pharyngeal foreign body 08/09/2017  . Pressure injury of skin 04/30/2017  . Chest pain 04/29/2017  . Hypertension 04/29/2017  . Aortic valve regurgitation     Past Surgical History:  Procedure Laterality Date  . ABDOMINAL HYSTERECTOMY    . DIRECT LARYNGOSCOPY N/A 08/09/2017   Procedure: DIRECT  laryngoscopy and rigid esophagoscopy;  Surgeon: Melida Quitter, MD;  Location: [facility];  Service: ENT;  Laterality: N/A;  . MASTECTOMY       OB History   None      Home Medications    Prior to Admission medications   Medication Sig Start Date End Date Taking? Authorizing Provider  acetaminophen (TYLENOL) 500 MG tablet Take 2 tablets (1,000 mg total) by mouth every 6 (six) hours as needed. Patient taking differently: Take 1,000 mg by mouth every 6 (six) hours as needed for mild pain, moderate  pain, fever or headache.  07/31/17  Yes Charlesetta Shanks, MD  amLODipine (NORVASC) 10 MG tablet Take 1 tablet (10 mg total) daily by mouth. 05/01/17  Yes Burgess Estelle, MD  ARIPiprazole (ABILIFY) 30 MG tablet Take 15 mg by mouth daily.    Yes [provider]  Cholecalciferol (VITAMIN D PO) Take 1,000 Units by mouth daily.    Yes [provider]  docusate sodium (COLACE) 100 MG capsule Take 1 capsule (100 mg total) by mouth every 12 (twelve) hours. 09/28/17  Yes Tanna Furry, MD  ferrous sulfate 325 (65 FE) MG tablet Take 325 mg by mouth daily.   Yes [provider]  fluticasone (FLONASE) 50 MCG/ACT nasal spray Place 2 sprays into both nostrils daily.    Yes [provider]  furosemide (LASIX) 20 MG tablet Take 20 mg daily by mouth.   Yes [provider]  hydrALAZINE (APRESOLINE) 10 MG tablet Take 1 tablet (10 mg total) by mouth 3 (three) times daily. To use as needed if patient's blood pressure remains greater than 150/80 when taking regular medications as prescribed. Patient taking differently: Take 10 mg by mouth daily.  07/31/17  Yes Charlesetta Shanks, MD  loratadine (CLARITIN) 10 MG tablet Take 10 mg by mouth daily.   Yes [provider]  losartan (COZAAR) 100 MG tablet Take 100 mg by mouth daily.   Yes [provider]  oxybutynin (DITROPAN) 5 MG tablet Take 2.5 mg  by mouth 2 (two) times daily.   Yes [provider]  pantoprazole (PROTONIX) 40 MG tablet Take 40 mg by mouth daily.   Yes [provider]  potassium chloride SA (K-DUR,KLOR-CON) 20 MEQ tablet Take 1 tablet (20 mEq total) by mouth 2 (two) times daily. 1/66/06  Yes Delora Fuel, MD  traMADol-acetaminophen (ULTRACET) 37.5-325 MG tablet Take 1 tablet by mouth every 6 (six) hours as needed. Patient taking differently: Take 1 tablet by mouth every 6 (six) hours as needed for moderate pain or severe pain.  09/15/17  Yes Daleen Bo, MD  venlafaxine XR (EFFEXOR-XR)  150 MG 24 hr capsule Take 150 mg by mouth daily with breakfast.   Yes [provider]  vitamin C (ASCORBIC ACID) 500 MG tablet Take 1,000 mg by mouth daily.   Yes [provider]  vitamin E 1000 UNIT capsule Take 1,000 Units by mouth daily.   Yes [provider]    Family History Family History  Problem Relation Age of Onset  . Heart attack Father     Social History Social History   Tobacco Use  . Smoking status: Former Research scientist (life sciences)  . Smokeless tobacco: Never Used  . Tobacco comment: As a teenager.  Substance Use Topics  . Alcohol use: No    Comment: Occasional wine in the past  . Drug use: No     Allergies   Influenza vaccines; Paliperidone; Aspirin; Penicillins; and Sulfa antibiotics   Review of Systems Review of Systems  Constitutional: Positive for activity change.  HENT: Negative for congestion.   Eyes: Positive for visual disturbance.  Respiratory: Negative for shortness of breath.   Cardiovascular: Negative for chest pain.  Gastrointestinal: Negative for abdominal pain, nausea and vomiting.  Genitourinary: Negative for dysuria.  Musculoskeletal: Negative for neck pain.  Skin: Negative for rash.  Allergic/Immunologic: Negative for immunocompromised state.  Neurological: Positive for headaches.  Hematological: Does not bruise/bleed easily.  All other systems reviewed and are negative.    Physical Exam Updated Vital Signs BP (!) 153/77 (BP Location: Left Arm)   Pulse 60   Temp 98 F (36.7 C) (Oral)   Resp 18   SpO2 100%   Physical Exam  Constitutional: She is oriented to person, place, and time. She appears well-developed.  HENT:  Head: Normocephalic and atraumatic.  Eyes: Pupils are equal, round, and reactive to light. EOM are normal.  Neck: Normal range of motion. Neck supple.  Cardiovascular: Normal rate.  Pulmonary/Chest: Effort normal.  Abdominal: Bowel sounds are normal.  Neurological: She is alert and oriented to  person, place, and time. She is not disoriented. No cranial nerve deficit or sensory deficit.  Cerebellar exam is normal (finger to nose) Sensory exam normal for bilateral upper and lower extremities - and patient is able to discriminate between sharp and dull. Motor exam is 4+/5  At a difficult time directing patient on peripheral visual acuity screen, but there is no neglect.  Skin: Skin is warm and dry.  Nursing note and vitals reviewed.    ED Treatments / Results  Labs (all labs ordered are listed, but only abnormal results are displayed) Labs Reviewed  BASIC METABOLIC PANEL - Abnormal; Notable for the following components:      Result Value   Chloride 100 (*)    BUN 22 (*)    GFR calc non Af Amer 55 (*)    All other components within normal limits  CBC WITH DIFFERENTIAL/PLATELET - Abnormal; Notable for the following components:  RBC 3.63 (*)    Hemoglobin 10.1 (*)    HCT 31.7 (*)    RDW 16.0 (*)    All other components within normal limits  SEDIMENTATION RATE - Abnormal; Notable for the following components:   Sed Rate 28 (*)    All other components within normal limits    EKG None  Radiology Ct Head Wo Contrast  Result Date: 10/26/2017 CLINICAL DATA:  Persistent generalized headache for 2 days. EXAM: CT HEAD WITHOUT CONTRAST TECHNIQUE: Contiguous axial images were obtained from the base of the skull through the vertex without intravenous contrast. COMPARISON:  04/17/2017 FINDINGS: Brain: Chronic stable involutional changes of the brain. Chronic tiny right-sided basal ganglial lacunar infarcts. Faint hyperdensity in the right basal ganglia compatible with idiopathic calcification. Mild-to-moderate small vessel ischemic disease of periventricular subcortical white matter. No acute large vascular territory infarct, intra-axial mass nor extra-axial fluid collections. No intracranial hemorrhage. Midline fourth ventricle. Patent basal cisterns. Vascular: No hyperdense vessel  sign. Atherosclerotic calcifications of the carotid siphons bilaterally. Skull: No skull fracture or suspicious osseous abnormality. Sinuses/Orbits: No acute finding. Other: Clear mastoids. IMPRESSION: Chronic mild-to-moderate small vessel ischemic disease of periventricular subcortical white matter. Chronic small right-sided basal ganglial lacunar infarcts. No acute intracranial abnormality. Electronically Signed   By: Ashley Royalty M.D.   On: 10/26/2017 02:13    Procedures Procedures (including critical care time)  Medications Ordered in ED Medications  ketorolac (TORADOL) 15 MG/ML injection 15 mg (15 mg Intravenous Given 10/26/17 0237)  metoCLOPramide (REGLAN) injection 10 mg (10 mg Intravenous Given 10/26/17 0237)     Initial Impression / Assessment and Plan / ED Course  I have reviewed the triage vital signs and the nursing notes.  Pertinent labs & imaging results that were available during my care of the patient were reviewed by me and considered in my medical decision making (see chart for details).  Clinical Course as of Oct 27 2338  Sat Oct 26, 2017  0230 Sed rate is less than 40. The probability for temporal arteritis being the etiology is extremely low now.  Sed Rate(!): 28 [AN]  0300 CT scan of the head does not show any bleed or tumor. Results from the ER discussed with the patient. I informed patient that my suspicion for stroke is extremely low.  We also discussed the possibility of movement disorders causing her tremors.  The other possibility includes complex headaches.  Headaches had resolved after Toradol and Reglan which is reassuring.  Patient has been advised to follow-up with neurologist for optimal work-up.  CT Head Wo Contrast [AN]    Clinical Course User Index [AN] Varney Biles, MD    DDX includes: Primary headaches - including migrainous headaches, cluster headaches, tension headaches. ICH Carotid dissection Cavernous sinus thrombosis Venous dural  thrombosis Meningitis Encephalitis Sinusitis Tumor Vascular headaches AV malformation Brain aneurysm Muscular headaches  A/P: Pt comes in with cc of headaches and associated nonspecific right-sided upper extremity tremor and bilateral visual floaters. Headache are moderately severe, constant and located over the vertex in the back of her head.  No neck pain or nuchal rigidity.   We will get CT scan of the head and basic labs.  Rule out tumor, rule out bleed.  Sed rate ordered given the combination of headache and some nonspecific vision complaints. Suspicion for infectious etiology is low given lack of fever.    If the ED work-up is normal, we will discharge patient with neurology follow-up.  She might need an MRI  or further testing to rule out thrombosis.  Final Clinical Impressions(s) / ED Diagnoses   Final diagnoses:  Tremor  Severe headache    ED Discharge Orders    None       Varney Biles, MD 10/26/17 2341

## 2017-10-26 NOTE — ED Notes (Signed)
[facility] here to transport pt back to [facility].

## 2017-11-02 ENCOUNTER — Emergency Department (HOSPITAL_COMMUNITY)
Admission: EM | Admit: 2017-11-02 | Discharge: 2017-11-02 | Disposition: A | Discharge status: Home / Self Care | Payer: Medicare Other | Attending: Emergency Medicine | Admitting: Emergency Medicine

## 2017-11-02 DIAGNOSIS — I509 Heart failure, unspecified: Secondary | ICD-10-CM | POA: Insufficient documentation

## 2017-11-02 DIAGNOSIS — M25511 Pain in right shoulder: Secondary | ICD-10-CM | POA: Diagnosis present

## 2017-11-02 DIAGNOSIS — M150 Primary generalized (osteo)arthritis: Secondary | ICD-10-CM | POA: Diagnosis not present

## 2017-11-02 DIAGNOSIS — Z8673 Personal history of transient ischemic attack (TIA), and cerebral infarction without residual deficits: Secondary | ICD-10-CM | POA: Diagnosis not present

## 2017-11-02 DIAGNOSIS — I10 Essential (primary) hypertension: Secondary | ICD-10-CM | POA: Diagnosis not present

## 2017-11-02 DIAGNOSIS — Z853 Personal history of malignant neoplasm of breast: Secondary | ICD-10-CM | POA: Insufficient documentation

## 2017-11-02 DIAGNOSIS — M8949 Other hypertrophic osteoarthropathy, multiple sites: Secondary | ICD-10-CM

## 2017-11-02 DIAGNOSIS — Z79899 Other long term (current) drug therapy: Secondary | ICD-10-CM | POA: Diagnosis not present

## 2017-11-02 DIAGNOSIS — M25562 Pain in left knee: Secondary | ICD-10-CM | POA: Insufficient documentation

## 2017-11-02 DIAGNOSIS — M159 Polyosteoarthritis, unspecified: Secondary | ICD-10-CM

## 2017-11-02 MED ORDER — ACETAMINOPHEN 325 MG PO TABS
650.0000 mg | ORAL_TABLET | Freq: Once | ORAL | Status: AC
  Administered 2017-11-02: 650 mg via ORAL
  Filled 2017-11-02: qty 2

## 2017-11-02 MED ORDER — IBUPROFEN 200 MG PO TABS
200.0000 mg | ORAL_TABLET | Freq: Once | ORAL | Status: AC
  Administered 2017-11-02: 200 mg via ORAL
  Filled 2017-11-02: qty 1

## 2017-11-02 NOTE — Discharge Instructions (Signed)
It was our pleasure to provide your ER care today - we hope that you feel better.  Take acetaminophen and/or ibuprofen as need for pain.  Follow up with primary care doctor in the coming week -  also have your blood pressure rechecked then as it is high today.   Return to ER if worse, new symptoms, fevers, other concern.

## 2017-11-02 NOTE — ED Triage Notes (Signed)
Pt comes to ed, via [employer], left leg stiffness, comes from [facility]. Alert x 4. Chronic condition.  V/s on 160/60, hr 54, rr18, spo2 99.  Right side mastectomy and restriction.

## 2017-11-02 NOTE — ED Provider Notes (Signed)
[facility] DEPT Provider Note   CSN: [id number] Arrival date & time: 11/02/17  1926     History   Chief Complaint Chief Complaint  Patient presents with  . Leg Pain  . Shoulder Pain    HPI Kerri Hernandez is a 82 y.o. female.  Patient c/o being out to dinner w friend, when noted right shoulder and left knee 'locking up on me'. Also notes soreness to areas. Dull, mild, persistent. Notes hx arthritis, and pain to both of those areas previously. Denies recent or acute injury to areas. States otherwise does not feel sick or ill. No fever or chills. No nv. No weakness. No numbness. Denies swelling or redness to areas. Has not taking anything for pain tonight.   The history is provided by the patient.  Leg Pain   Pertinent negatives include no numbness.  Shoulder Pain   Pertinent negatives include no numbness.    Past Medical History:  Diagnosis Date  . Arthritis   . Asthma   . Cancer ([place])   . CHF (congestive heart failure) ([place])   . Hypertension   . TIA (transient ischemic attack)     Patient Active Problem List   Diagnosis Date Noted  . Pharyngeal foreign body 08/09/2017  . Pressure injury of skin 04/30/2017  . Chest pain 04/29/2017  . Hypertension 04/29/2017  . Aortic valve regurgitation     Past Surgical History:  Procedure Laterality Date  . ABDOMINAL HYSTERECTOMY    . DIRECT LARYNGOSCOPY N/A 08/09/2017   Procedure: DIRECT  laryngoscopy and rigid esophagoscopy;  Surgeon: Melida Quitter, MD;  Location: [facility];  Service: ENT;  Laterality: N/A;  . MASTECTOMY       OB History   None      Home Medications    Prior to Admission medications   Medication Sig Start Date End Date Taking? Authorizing Provider  acetaminophen (TYLENOL) 500 MG tablet Take 2 tablets (1,000 mg total) by mouth every 6 (six) hours as needed. Patient taking differently: Take 1,000 mg by mouth every 6 (six) hours as needed for mild pain, moderate pain, fever  or headache.  07/31/17   Charlesetta Shanks, MD  amLODipine (NORVASC) 10 MG tablet Take 1 tablet (10 mg total) daily by mouth. 05/01/17   Burgess Estelle, MD  ARIPiprazole (ABILIFY) 30 MG tablet Take 15 mg by mouth daily.     [provider]  Cholecalciferol (VITAMIN D PO) Take 1,000 Units by mouth daily.     [provider]  docusate sodium (COLACE) 100 MG capsule Take 1 capsule (100 mg total) by mouth every 12 (twelve) hours. 09/28/17   Tanna Furry, MD  ferrous sulfate 325 (65 FE) MG tablet Take 325 mg by mouth daily.    [provider]  fluticasone (FLONASE) 50 MCG/ACT nasal spray Place 2 sprays into both nostrils daily.     [provider]  furosemide (LASIX) 20 MG tablet Take 20 mg daily by mouth.    [provider]  hydrALAZINE (APRESOLINE) 10 MG tablet Take 1 tablet (10 mg total) by mouth 3 (three) times daily. To use as needed if patient's blood pressure remains greater than 150/80 when taking regular medications as prescribed. Patient taking differently: Take 10 mg by mouth daily.  07/31/17   Charlesetta Shanks, MD  loratadine (CLARITIN) 10 MG tablet Take 10 mg by mouth daily.    [provider]  losartan (COZAAR) 100 MG tablet Take 100 mg by mouth daily.  [provider]  oxybutynin (DITROPAN) 5 MG tablet Take 2.5 mg by mouth 2 (two) times daily.    [provider]  pantoprazole (PROTONIX) 40 MG tablet Take 40 mg by mouth daily.    [provider]  potassium chloride SA (K-DUR,KLOR-CON) 20 MEQ tablet Take 1 tablet (20 mEq total) by mouth 2 (two) times daily. 3/71/69   Delora Fuel, MD  traMADol-acetaminophen (ULTRACET) 37.5-325 MG tablet Take 1 tablet by mouth every 6 (six) hours as needed. Patient taking differently: Take 1 tablet by mouth every 6 (six) hours as needed for moderate pain or severe pain.  09/15/17   Daleen Bo, MD  venlafaxine XR (EFFEXOR-XR) 150 MG 24 hr capsule Take 150 mg by mouth daily with  breakfast.    [provider]  vitamin C (ASCORBIC ACID) 500 MG tablet Take 1,000 mg by mouth daily.    [provider]  vitamin E 1000 UNIT capsule Take 1,000 Units by mouth daily.    [provider]    Family History Family History  Problem Relation Age of Onset  . Heart attack Father     Social History Social History   Tobacco Use  . Smoking status: Former Research scientist (life sciences)  . Smokeless tobacco: Never Used  . Tobacco comment: As a teenager.  Substance Use Topics  . Alcohol use: No    Comment: Occasional wine in the past  . Drug use: No     Allergies   Influenza vaccines; Paliperidone; Aspirin; Penicillins; and Sulfa antibiotics   Review of Systems Review of Systems  Constitutional: Negative for chills and fever.  Cardiovascular: Negative for leg swelling.  Musculoskeletal: Positive for arthralgias. Negative for back pain and neck pain.  Skin: Negative for rash.  Neurological: Negative for weakness and numbness.     Physical Exam Updated Vital Signs BP (!) 180/73 (BP Location: Left Arm)   Pulse 61   Resp 14   SpO2 100%   Physical Exam  Constitutional: She appears well-developed and well-nourished. No distress.  HENT:  Head: Atraumatic.  Eyes: Conjunctivae are normal. No scleral icterus.  Neck: Neck supple. No tracheal deviation present.  Cardiovascular: Normal rate and intact distal pulses.  Pulmonary/Chest: Effort normal. No respiratory distress.  Abdominal: Normal appearance.  Musculoskeletal: She exhibits no edema.  Good rom bilateral extremities, specifically good passive rom right shoulder and left knee without pain. No gross sts noted. No knee effusion. No arm or leg edema. Distal pulses palp bil. No erythema or increased warmth to areas of pain.   Neurological: She is alert.  Speech normal. Motor intact bil, stre 5/5. sens grossly intact.   Skin: Skin is warm and dry. No rash noted. She is not diaphoretic.  Psychiatric: She has a  normal mood and affect.  Nursing note and vitals reviewed.    ED Treatments / Results  Labs (all labs ordered are listed, but only abnormal results are displayed) Labs Reviewed - No data to display  EKG None  Radiology No results found.  Procedures Procedures (including critical care time)  Medications Ordered in ED Medications  acetaminophen (TYLENOL) tablet 650 mg (has no administration in time range)  ibuprofen (ADVIL,MOTRIN) tablet 200 mg (has no administration in time range)     Initial Impression / Assessment and Plan / ED Course  I have reviewed the triage vital signs and the nursing notes.  Pertinent labs & imaging results that were available during my care of the patient were reviewed by me and considered  in my medical decision making (see chart for details).  Reviewed nursing notes and prior charts for additional history.   Patient with prior imaging of right shoulder and left knee within the past few months. Pt also w recent labs this past week c/w baseline.   Pt afebrile.   Acetaminophen and ibuprofen po. Po fluids.  Pt currently appears stable for d/c.     Final Clinical Impressions(s) / ED Diagnoses   Final diagnoses:  None    ED Discharge Orders    None       Lajean Saver, MD 11/02/17 2035

## 2017-11-18 ENCOUNTER — Emergency Department (HOSPITAL_COMMUNITY)
Admission: EM | Admit: 2017-11-18 | Discharge: 2017-11-19 | Disposition: A | Discharge status: Home / Self Care | Payer: Medicare Other | Attending: Emergency Medicine | Admitting: Emergency Medicine

## 2017-11-18 ENCOUNTER — Encounter (HOSPITAL_COMMUNITY): Payer: Self-pay | Admitting: Emergency Medicine

## 2017-11-18 ENCOUNTER — Emergency Department (HOSPITAL_COMMUNITY): Payer: Medicare Other

## 2017-11-18 DIAGNOSIS — I509 Heart failure, unspecified: Secondary | ICD-10-CM | POA: Diagnosis not present

## 2017-11-18 DIAGNOSIS — J45909 Unspecified asthma, uncomplicated: Secondary | ICD-10-CM | POA: Insufficient documentation

## 2017-11-18 DIAGNOSIS — R109 Unspecified abdominal pain: Secondary | ICD-10-CM | POA: Diagnosis present

## 2017-11-18 DIAGNOSIS — I110 Hypertensive heart disease with heart failure: Secondary | ICD-10-CM | POA: Diagnosis not present

## 2017-11-18 DIAGNOSIS — T502X5A Adverse effect of carbonic-anhydrase inhibitors, benzothiadiazides and other diuretics, initial encounter: Secondary | ICD-10-CM

## 2017-11-18 DIAGNOSIS — Z87891 Personal history of nicotine dependence: Secondary | ICD-10-CM | POA: Diagnosis not present

## 2017-11-18 DIAGNOSIS — Z79899 Other long term (current) drug therapy: Secondary | ICD-10-CM | POA: Diagnosis not present

## 2017-11-18 DIAGNOSIS — E876 Hypokalemia: Secondary | ICD-10-CM

## 2017-11-18 DIAGNOSIS — R300 Dysuria: Secondary | ICD-10-CM | POA: Insufficient documentation

## 2017-11-18 DIAGNOSIS — R1030 Lower abdominal pain, unspecified: Secondary | ICD-10-CM

## 2017-11-18 DIAGNOSIS — R102 Pelvic and perineal pain: Secondary | ICD-10-CM

## 2017-11-18 LAB — CBC WITH DIFFERENTIAL/PLATELET
Basophils Absolute: 0 10*3/uL (ref 0.0–0.1)
Basophils Relative: 0 %
Eosinophils Absolute: 0.1 10*3/uL (ref 0.0–0.7)
Eosinophils Relative: 2 %
HCT: 33.9 % — ABNORMAL LOW (ref 36.0–46.0)
Hemoglobin: 10.7 g/dL — ABNORMAL LOW (ref 12.0–15.0)
Lymphocytes Relative: 32 %
Lymphs Abs: 2.5 10*3/uL (ref 0.7–4.0)
MCH: 27.8 pg (ref 26.0–34.0)
MCHC: 31.6 g/dL (ref 30.0–36.0)
MCV: 88.1 fL (ref 78.0–100.0)
Monocytes Absolute: 0.9 10*3/uL (ref 0.1–1.0)
Monocytes Relative: 11 %
Neutro Abs: 4.4 10*3/uL (ref 1.7–7.7)
Neutrophils Relative %: 55 %
Platelets: 262 10*3/uL (ref 150–400)
RBC: 3.85 MIL/uL — ABNORMAL LOW (ref 3.87–5.11)
RDW: 16 % — ABNORMAL HIGH (ref 11.5–15.5)
WBC: 7.8 10*3/uL (ref 4.0–10.5)

## 2017-11-18 LAB — BASIC METABOLIC PANEL
Anion gap: 10 (ref 5–15)
BUN: 18 mg/dL (ref 6–20)
CO2: 29 mmol/L (ref 22–32)
Calcium: 9.1 mg/dL (ref 8.9–10.3)
Chloride: 103 mmol/L (ref 101–111)
Creatinine, Ser: 1.06 mg/dL — ABNORMAL HIGH (ref 0.44–1.00)
GFR calc Af Amer: 56 mL/min — ABNORMAL LOW (ref >60)
GFR calc non Af Amer: 49 mL/min — ABNORMAL LOW (ref >60)
Glucose, Bld: 97 mg/dL (ref 65–99)
Potassium: 3 mmol/L — ABNORMAL LOW (ref 3.5–5.1)
Sodium: 142 mmol/L (ref 135–145)

## 2017-11-18 MED ORDER — POTASSIUM CHLORIDE CRYS ER 20 MEQ PO TBCR
40.0000 meq | EXTENDED_RELEASE_TABLET | Freq: Once | ORAL | Status: AC
  Administered 2017-11-19: 40 meq via ORAL
  Filled 2017-11-18: qty 2

## 2017-11-18 NOTE — ED Notes (Signed)
Bed: CW88 Expected date:  Expected time:  Means of arrival:  Comments: 82 YO Female: Abd pain

## 2017-11-18 NOTE — ED Provider Notes (Signed)
[facility] DEPT Provider Note   CSN: [id number] Arrival date & time: 11/18/17  2030     History   Chief Complaint Chief Complaint  Patient presents with  . Vaginitis    c/o of pain     HPI Kerri Hernandez is a 82 y.o. female.  HPI  82 year old female with abdominal pain.  Woke up this morning with suprapubic discomfort.  Is been constant throughout the day.  Associated with dysuria.  Subjective fevers.  No cough or shortness of breath.  Has not tried taking anything for her symptoms.  Past Medical History:  Diagnosis Date  . Arthritis   . Asthma   . Cancer ([place])   . CHF (congestive heart failure) ([place])   . Hypertension   . TIA (transient ischemic attack)     Patient Active Problem List   Diagnosis Date Noted  . Pharyngeal foreign body 08/09/2017  . Pressure injury of skin 04/30/2017  . Chest pain 04/29/2017  . Hypertension 04/29/2017  . Aortic valve regurgitation     Past Surgical History:  Procedure Laterality Date  . ABDOMINAL HYSTERECTOMY    . DIRECT LARYNGOSCOPY N/A 08/09/2017   Procedure: DIRECT  laryngoscopy and rigid esophagoscopy;  Surgeon: Melida Quitter, MD;  Location: [facility];  Service: ENT;  Laterality: N/A;  . MASTECTOMY       OB History   None      Home Medications    Prior to Admission medications   Medication Sig Start Date End Date Taking? Authorizing Provider  acetaminophen (TYLENOL) 500 MG tablet Take 2 tablets (1,000 mg total) by mouth every 6 (six) hours as needed. Patient taking differently: Take 1,000 mg by mouth every 6 (six) hours as needed for mild pain, moderate pain, fever or headache.  07/31/17  Yes Charlesetta Shanks, MD  amLODipine (NORVASC) 10 MG tablet Take 1 tablet (10 mg total) daily by mouth. 05/01/17  Yes Burgess Estelle, MD  ARIPiprazole (ABILIFY) 15 MG tablet Take 15 mg by mouth daily. 11/04/17  Yes [provider]  Cholecalciferol (VITAMIN D PO) Take 1,000 Units by mouth daily.    Yes  [provider]  docusate sodium (COLACE) 100 MG capsule Take 1 capsule (100 mg total) by mouth every 12 (twelve) hours. 09/28/17  Yes Tanna Furry, MD  ferrous sulfate 325 (65 FE) MG tablet Take 325 mg by mouth daily.   Yes [provider]  fluticasone (FLONASE) 50 MCG/ACT nasal spray Place 2 sprays into both nostrils daily.    Yes [provider]  furosemide (LASIX) 20 MG tablet Take 20 mg daily by mouth.   Yes [provider]  hydrALAZINE (APRESOLINE) 10 MG tablet Take 1 tablet (10 mg total) by mouth 3 (three) times daily. To use as needed if patient's blood pressure remains greater than 150/80 when taking regular medications as prescribed. Patient taking differently: Take 10 mg by mouth daily.  07/31/17  Yes Charlesetta Shanks, MD  loratadine (CLARITIN) 10 MG tablet Take 10 mg by mouth daily.   Yes [provider]  losartan (COZAAR) 100 MG tablet Take 100 mg by mouth daily.   Yes [provider]  nitroGLYCERIN (NITROSTAT) 0.4 MG SL tablet Place 0.4 mg under the tongue every 5 (five) minutes as needed for chest pain. For up to 3 consecutive doses *Do not crush*   Yes [provider]  oxybutynin (DITROPAN) 5 MG tablet Take 2.5 mg by mouth 2 (two) times daily.   Yes [provider]  pantoprazole (PROTONIX) 40 MG tablet Take 40 mg by mouth daily. Do not crush   Yes [provider]  potassium chloride SA (K-DUR,KLOR-CON) 20 MEQ tablet Take 1 tablet (20 mEq total) by mouth 2 (two) times daily. Patient taking differently: Take 20 mEq by mouth 2 (two) times daily. Do not crush 6/38/93  Yes Delora Fuel, MD  traMADol-acetaminophen (ULTRACET) 37.5-325 MG tablet Take 1 tablet by mouth every 6 (six) hours as needed. Patient taking differently: Take 1 tablet by mouth every 6 (six) hours as needed for moderate pain or severe pain.  09/15/17  Yes Daleen Bo, MD  venlafaxine XR (EFFEXOR-XR) 150 MG 24 hr capsule Take 150 mg by mouth  daily with breakfast.   Yes [provider]  VENTOLIN HFA 108 (90 Base) MCG/ACT inhaler Take 2 puffs by mouth every 4 (four) hours as needed. 11/15/17  Yes [provider]  vitamin C (ASCORBIC ACID) 500 MG tablet Take 1,000 mg by mouth daily.   Yes [provider]  vitamin E 1000 UNIT capsule Take 1,000 Units by mouth daily.   Yes [provider]    Family History Family History  Problem Relation Age of Onset  . Heart attack Father     Social History Social History   Tobacco Use  . Smoking status: Former Research scientist (life sciences)  . Smokeless tobacco: Never Used  . Tobacco comment: As a teenager.  Substance Use Topics  . Alcohol use: No    Comment: Occasional wine in the past  . Drug use: No     Allergies   Influenza vaccines; Paliperidone; Aspirin; Penicillins; and Sulfa antibiotics   Review of Systems Review of Systems  All systems reviewed and negative, other than as noted in HPI.  Physical Exam Updated Vital Signs BP (!) 151/85 (BP Location: Left Arm)   Pulse (!) 58   Temp 97.9 F (36.6 C) (Oral)   Resp 13   SpO2 95%   Physical Exam  Constitutional: She appears well-developed and well-nourished. No distress.  HENT:  Head: Normocephalic and atraumatic.  Eyes: Conjunctivae are normal. Right eye exhibits no discharge. Left eye exhibits no discharge.  Neck: Neck supple.  Cardiovascular: Normal rate, regular rhythm and normal heart sounds. Exam reveals no gallop and no friction rub.  No murmur heard. Pulmonary/Chest: Effort normal and breath sounds normal. No respiratory distress.  Abdominal: Soft. She exhibits no distension. There is tenderness.  Mild suprapubic tenderness without rebound or guarding.  No distention.  Musculoskeletal: She exhibits no edema or tenderness.  Neurological: She is alert.  Skin: Skin is warm and dry.  Psychiatric: She has a normal mood and affect. Her behavior is normal. Thought content normal.  Nursing note and  vitals reviewed.    ED Treatments / Results  Labs (all labs ordered are listed, but only abnormal results are displayed) Labs Reviewed  CBC WITH DIFFERENTIAL/PLATELET - Abnormal; Notable for the following components:      Result Value   RBC 3.85 (*)    Hemoglobin 10.7 (*)    HCT 33.9 (*)    RDW 16.0 (*)    All other components within normal limits  BASIC METABOLIC PANEL - Abnormal; Notable for the following components:   Potassium 3.0 (*)    Creatinine, Ser 1.06 (*)    GFR calc non Af Amer 49 (*)    GFR calc Af Amer 56 (*)    All other components within normal limits  URINALYSIS, ROUTINE W REFLEX MICROSCOPIC  EKG None  Radiology Dg Chest 2 View  Result Date: 11/18/2017 CLINICAL DATA:  Acute onset of dysuria and right arm pain. EXAM: CHEST - 2 VIEW COMPARISON:  Chest radiograph performed 09/15/2017 FINDINGS: The lungs are well-aerated and clear. There is no evidence of focal opacification, pleural effusion or pneumothorax. The heart is borderline enlarged. No acute osseous abnormalities are seen. IMPRESSION: Borderline cardiomegaly; no acute cardiopulmonary process seen. Electronically Signed   By: Garald Balding M.D.   On: 11/18/2017 22:01    Procedures Procedures (including critical care time)  Medications Ordered in ED Medications - No data to display   Initial Impression / Assessment and Plan / ED Course  I have reviewed the triage vital signs and the nursing notes.  Pertinent labs & imaging results that were available during my care of the patient were reviewed by me and considered in my medical decision making (see chart for details).    82 year old female with lower abdominal pain or dysuria.  Suspect she may have a UTI.  She reports fever but she is afebrile in the emergency room.  No leukocytosis.  Nontoxic appearance.  UA is still pending at this time.  She does have a UTI, I feel she is appropriate for outpatient treatment.  Final Clinical Impressions(s) /  ED Diagnoses   Final diagnoses:  Lower abdominal pain    ED Discharge Orders    None       Virgel Manifold, MD 11/19/17 0001

## 2017-11-18 NOTE — ED Triage Notes (Signed)
Pt comes from [facility], woke up this morning w vaginal pain, pt was given tylenlol.  V/s on arrival 172/90, hr 61, rr16, spo2 98 room air, alert x4

## 2017-11-19 DIAGNOSIS — R1030 Lower abdominal pain, unspecified: Secondary | ICD-10-CM | POA: Diagnosis not present

## 2017-11-19 LAB — URINALYSIS, ROUTINE W REFLEX MICROSCOPIC
Bilirubin Urine: NEGATIVE
Glucose, UA: NEGATIVE mg/dL
Hgb urine dipstick: NEGATIVE
Ketones, ur: NEGATIVE mg/dL
Leukocytes, UA: NEGATIVE
Nitrite: NEGATIVE
Protein, ur: NEGATIVE mg/dL
Specific Gravity, Urine: 1.006 (ref 1.005–1.030)
pH: 8 (ref 5.0–8.0)

## 2017-11-19 MED ORDER — POTASSIUM CHLORIDE CRYS ER 20 MEQ PO TBCR: 20.0000 meq | EXTENDED_RELEASE_TABLET | Freq: Two times a day (BID) | ORAL | 0 refills | Status: AC

## 2017-11-19 MED ORDER — PHENAZOPYRIDINE HCL 200 MG PO TABS: 200.0000 mg | ORAL_TABLET | Freq: Three times a day (TID) | ORAL | 0 refills | Status: DC

## 2017-11-19 NOTE — ED Provider Notes (Signed)
Patient signed out to me by Dr. Cori Razor pending urinalysis for suspected UTI.  Urinalysis does not show evidence of UTI.  She has been having some dysuria, will treat with phenazopyridine for 2 days.  Also, she is noted to be moderately hypokalemic.  She is on furosemide.  She is supposed to be taking potassium at home, but she is not sure if she actually is still taking it.  She is given a new prescription for K-Dur.  Follow-up with PCP.  Results for orders placed or performed during the hospital encounter of 11/18/17  CBC with Differential  Result Value Ref Range   WBC 7.8 4.0 - 10.5 K/uL   RBC 3.85 (L) 3.87 - 5.11 MIL/uL   Hemoglobin 10.7 (L) 12.0 - 15.0 g/dL   HCT 33.9 (L) 36.0 - 46.0 %   MCV 88.1 78.0 - 100.0 fL   MCH 27.8 26.0 - 34.0 pg   MCHC 31.6 30.0 - 36.0 g/dL   RDW 16.0 (H) 11.5 - 15.5 %   Platelets 262 150 - 400 K/uL   Neutrophils Relative % 55 %   Neutro Abs 4.4 1.7 - 7.7 K/uL   Lymphocytes Relative 32 %   Lymphs Abs 2.5 0.7 - 4.0 K/uL   Monocytes Relative 11 %   Monocytes Absolute 0.9 0.1 - 1.0 K/uL   Eosinophils Relative 2 %   Eosinophils Absolute 0.1 0.0 - 0.7 K/uL   Basophils Relative 0 %   Basophils Absolute 0.0 0.0 - 0.1 K/uL  Basic metabolic panel  Result Value Ref Range   Sodium 142 135 - 145 mmol/L   Potassium 3.0 (L) 3.5 - 5.1 mmol/L   Chloride 103 101 - 111 mmol/L   CO2 29 22 - 32 mmol/L   Glucose, Bld 97 65 - 99 mg/dL   BUN 18 6 - 20 mg/dL   Creatinine, Ser 1.06 (H) 0.44 - 1.00 mg/dL   Calcium 9.1 8.9 - 10.3 mg/dL   GFR calc non Af Amer 49 (L) >60 mL/min   GFR calc Af Amer 56 (L) >60 mL/min   Anion gap 10 5 - 15  Urinalysis, Routine w reflex microscopic  Result Value Ref Range   Color, Urine COLORLESS (A) YELLOW   APPearance CLEAR CLEAR   Specific Gravity, Urine 1.006 1.005 - 1.030   pH 8.0 5.0 - 8.0   Glucose, UA NEGATIVE NEGATIVE mg/dL   Hgb urine dipstick NEGATIVE NEGATIVE   Bilirubin Urine NEGATIVE NEGATIVE   Ketones, ur NEGATIVE NEGATIVE  mg/dL   Protein, ur NEGATIVE NEGATIVE mg/dL   Nitrite NEGATIVE NEGATIVE   Leukocytes, UA NEGATIVE NEGATIVE   Dg Chest 2 View  Result Date: 11/18/2017 CLINICAL DATA:  Acute onset of dysuria and right arm pain. EXAM: CHEST - 2 VIEW COMPARISON:  Chest radiograph performed 09/15/2017 FINDINGS: The lungs are well-aerated and clear. There is no evidence of focal opacification, pleural effusion or pneumothorax. The heart is borderline enlarged. No acute osseous abnormalities are seen. IMPRESSION: Borderline cardiomegaly; no acute cardiopulmonary process seen. Electronically Signed   By: Garald Balding M.D.   On: 11/18/2017 22:01   Ct Head Wo Contrast  Result Date: 10/26/2017 CLINICAL DATA:  Persistent generalized headache for 2 days. EXAM: CT HEAD WITHOUT CONTRAST TECHNIQUE: Contiguous axial images were obtained from the base of the skull through the vertex without intravenous contrast. COMPARISON:  04/17/2017 FINDINGS: Brain: Chronic stable involutional changes of the brain. Chronic tiny right-sided basal ganglial lacunar infarcts. Faint hyperdensity in the right basal  ganglia compatible with idiopathic calcification. Mild-to-moderate small vessel ischemic disease of periventricular subcortical white matter. No acute large vascular territory infarct, intra-axial mass nor extra-axial fluid collections. No intracranial hemorrhage. Midline fourth ventricle. Patent basal cisterns. Vascular: No hyperdense vessel sign. Atherosclerotic calcifications of the carotid siphons bilaterally. Skull: No skull fracture or suspicious osseous abnormality. Sinuses/Orbits: No acute finding. Other: Clear mastoids. IMPRESSION: Chronic mild-to-moderate small vessel ischemic disease of periventricular subcortical white matter. Chronic small right-sided basal ganglial lacunar infarcts. No acute intracranial abnormality. Electronically Signed   By: Ashley Royalty M.D.   On: 88/28/0034 91:79      Delora Fuel, MD 15/05/69 [phone]

## 2017-11-24 ENCOUNTER — Encounter (HOSPITAL_COMMUNITY): Payer: Self-pay | Admitting: *Deleted

## 2017-11-24 ENCOUNTER — Emergency Department (HOSPITAL_COMMUNITY)
Admission: EM | Admit: 2017-11-24 | Discharge: 2017-11-24 | Disposition: A | Discharge status: Home / Self Care | Payer: Medicare Other | Attending: Emergency Medicine | Admitting: Emergency Medicine

## 2017-11-24 ENCOUNTER — Emergency Department (HOSPITAL_COMMUNITY): Payer: Medicare Other

## 2017-11-24 ENCOUNTER — Other Ambulatory Visit: Payer: Self-pay

## 2017-11-24 DIAGNOSIS — I110 Hypertensive heart disease with heart failure: Secondary | ICD-10-CM | POA: Insufficient documentation

## 2017-11-24 DIAGNOSIS — R197 Diarrhea, unspecified: Secondary | ICD-10-CM

## 2017-11-24 DIAGNOSIS — R072 Precordial pain: Secondary | ICD-10-CM | POA: Insufficient documentation

## 2017-11-24 DIAGNOSIS — R1084 Generalized abdominal pain: Secondary | ICD-10-CM | POA: Diagnosis not present

## 2017-11-24 DIAGNOSIS — I509 Heart failure, unspecified: Secondary | ICD-10-CM | POA: Insufficient documentation

## 2017-11-24 DIAGNOSIS — Z87891 Personal history of nicotine dependence: Secondary | ICD-10-CM | POA: Diagnosis not present

## 2017-11-24 DIAGNOSIS — Z79899 Other long term (current) drug therapy: Secondary | ICD-10-CM | POA: Diagnosis not present

## 2017-11-24 DIAGNOSIS — R109 Unspecified abdominal pain: Secondary | ICD-10-CM | POA: Diagnosis present

## 2017-11-24 DIAGNOSIS — J45909 Unspecified asthma, uncomplicated: Secondary | ICD-10-CM | POA: Diagnosis not present

## 2017-11-24 LAB — CBC
HCT: 33.7 % — ABNORMAL LOW (ref 36.0–46.0)
Hemoglobin: 10.5 g/dL — ABNORMAL LOW (ref 12.0–15.0)
MCH: 27.9 pg (ref 26.0–34.0)
MCHC: 31.2 g/dL (ref 30.0–36.0)
MCV: 89.4 fL (ref 78.0–100.0)
Platelets: 230 K/uL (ref 150–400)
RBC: 3.77 MIL/uL — ABNORMAL LOW (ref 3.87–5.11)
RDW: 15.4 % (ref 11.5–15.5)
WBC: 6 K/uL (ref 4.0–10.5)

## 2017-11-24 LAB — URINALYSIS, ROUTINE W REFLEX MICROSCOPIC
BILIRUBIN URINE: NEGATIVE
Glucose, UA: NEGATIVE mg/dL
Hgb urine dipstick: NEGATIVE
Ketones, ur: NEGATIVE mg/dL
LEUKOCYTES UA: NEGATIVE
NITRITE: NEGATIVE
PH: 6 (ref 5.0–8.0)
Protein, ur: NEGATIVE mg/dL
SPECIFIC GRAVITY, URINE: 1.013 (ref 1.005–1.030)

## 2017-11-24 LAB — COMPREHENSIVE METABOLIC PANEL
ALBUMIN: 3.4 g/dL — AB (ref 3.5–5.0)
ALT: 11 U/L — ABNORMAL LOW (ref 14–54)
ANION GAP: 7 (ref 5–15)
AST: 19 U/L (ref 15–41)
Alkaline Phosphatase: 64 U/L (ref 38–126)
BILIRUBIN TOTAL: 0.7 mg/dL (ref 0.3–1.2)
BUN: 13 mg/dL (ref 6–20)
CO2: 30 mmol/L (ref 22–32)
Calcium: 8.9 mg/dL (ref 8.9–10.3)
Chloride: 105 mmol/L (ref 101–111)
Creatinine, Ser: 1.02 mg/dL — ABNORMAL HIGH (ref 0.44–1.00)
GFR, EST AFRICAN AMERICAN: 59 mL/min — AB (ref >60)
GFR, EST NON AFRICAN AMERICAN: 51 mL/min — AB (ref >60)
GLUCOSE: 90 mg/dL (ref 65–99)
POTASSIUM: 3.2 mmol/L — AB (ref 3.5–5.1)
Sodium: 142 mmol/L (ref 135–145)
TOTAL PROTEIN: 6.6 g/dL (ref 6.5–8.1)

## 2017-11-24 LAB — I-STAT TROPONIN, ED: TROPONIN I, POC: 0 ng/mL (ref 0.00–0.08)

## 2017-11-24 LAB — LIPASE, BLOOD: Lipase: 28 U/L (ref 11–51)

## 2017-11-24 NOTE — ED Notes (Signed)
[facility] called @ 0716-per [place], RN-called by Levada Dy

## 2017-11-24 NOTE — ED Notes (Signed)
Pt reports feeling like she has a fever. Oral temp comes back as 97.7 but patient requests a rectal temperature be taken to confirm.

## 2017-11-24 NOTE — ED Triage Notes (Addendum)
Pt arrived from [facility] for chest pain that started tonight with NVx3. Reports receiving a shower at the facility and "the cold water hit me first, then my chest started hurting." Also has had productive cough with grey sputum. EMS gave 324mg  ASA and nitro x1 without improvement of pain. Pt also reports abd pain for the past 3-4  Days with urinary frequency.

## 2017-11-24 NOTE — ED Provider Notes (Signed)
[facility] EMERGENCY DEPARTMENT Provider Note   CSN: [id number] Arrival date & time: 11/24/17  0422     History   Chief Complaint Chief Complaint  Patient presents with  . Abdominal Pain  . Chest Pain    HPI Kerri Hernandez is a 82 y.o. female.  The history is provided by the patient.  Chest Pain   This is a new problem. Episode onset: prior To arrival. The problem occurs constantly. The problem has not changed since onset.The pain is moderate. The pain does not radiate. Associated symptoms include abdominal pain, cough, nausea and vomiting. Pertinent negatives include no fever. She has tried nitroglycerin (ASA) for the symptoms. The treatment provided no relief. Risk factors include being elderly.   Patient with a history of asthma, CHF, hypertension, TIA Presents with multiple complaints.  She reports she stays at [facility].  She reports tonight while having a cold shower her chest started hurting when she was hit with cold water.  She also reports nausea/vomiting.  She also reports abdominal pain.  She also reports diarrhea.  She also mentions dysuria and urinary frequency.  She was given aspirin and nitroglycerin in route without improvement Past Medical History:  Diagnosis Date  . Arthritis   . Asthma   . Cancer ([place])   . CHF (congestive heart failure) ([place])   . Hypertension   . TIA (transient ischemic attack)     Patient Active Problem List   Diagnosis Date Noted  . Pharyngeal foreign body 08/09/2017  . Pressure injury of skin 04/30/2017  . Chest pain 04/29/2017  . Hypertension 04/29/2017  . Aortic valve regurgitation     Past Surgical History:  Procedure Laterality Date  . ABDOMINAL HYSTERECTOMY    . DIRECT LARYNGOSCOPY N/A 08/09/2017   Procedure: DIRECT  laryngoscopy and rigid esophagoscopy;  Surgeon: Melida Quitter, MD;  Location: [facility];  Service: ENT;  Laterality: N/A;  . MASTECTOMY       OB History   None      Home  Medications    Prior to Admission medications   Medication Sig Start Date End Date Taking? Authorizing Provider  acetaminophen (TYLENOL) 500 MG tablet Take 2 tablets (1,000 mg total) by mouth every 6 (six) hours as needed. Patient taking differently: Take 1,000 mg by mouth every 6 (six) hours as needed for mild pain, moderate pain, fever or headache.  07/31/17   Charlesetta Shanks, MD  amLODipine (NORVASC) 10 MG tablet Take 1 tablet (10 mg total) daily by mouth. 05/01/17   Burgess Estelle, MD  ARIPiprazole (ABILIFY) 15 MG tablet Take 15 mg by mouth daily. 11/04/17   [provider]  Cholecalciferol (VITAMIN D PO) Take 1,000 Units by mouth daily.     [provider]  docusate sodium (COLACE) 100 MG capsule Take 1 capsule (100 mg total) by mouth every 12 (twelve) hours. 09/28/17   Tanna Furry, MD  ferrous sulfate 325 (65 FE) MG tablet Take 325 mg by mouth daily.    [provider]  fluticasone (FLONASE) 50 MCG/ACT nasal spray Place 2 sprays into both nostrils daily.     [provider]  furosemide (LASIX) 20 MG tablet Take 20 mg daily by mouth.    [provider]  hydrALAZINE (APRESOLINE) 10 MG tablet Take 1 tablet (10 mg total) by mouth 3 (three) times daily. To use as needed if patient's blood pressure remains greater than 150/80 when taking regular medications as prescribed. Patient taking differently: Take  10 mg by mouth daily.  07/31/17   Charlesetta Shanks, MD  loratadine (CLARITIN) 10 MG tablet Take 10 mg by mouth daily.    [provider]  losartan (COZAAR) 100 MG tablet Take 100 mg by mouth daily.    [provider]  nitroGLYCERIN (NITROSTAT) 0.4 MG SL tablet Place 0.4 mg under the tongue every 5 (five) minutes as needed for chest pain. For up to 3 consecutive doses *Do not crush*    [provider]  oxybutynin (DITROPAN) 5 MG tablet Take 2.5 mg by mouth 2 (two) times daily.    [provider]  pantoprazole (PROTONIX) 40  MG tablet Take 40 mg by mouth daily. Do not crush    [provider]  phenazopyridine (PYRIDIUM) 200 MG tablet Take 1 tablet (200 mg total) by mouth 3 (three) times daily. 06/23/94   Delora Fuel, MD  potassium chloride SA (K-DUR,KLOR-CON) 20 MEQ tablet Take 1 tablet (20 mEq total) by mouth 2 (two) times daily. 12/24/91   Delora Fuel, MD  traMADol-acetaminophen (ULTRACET) 37.5-325 MG tablet Take 1 tablet by mouth every 6 (six) hours as needed. Patient taking differently: Take 1 tablet by mouth every 6 (six) hours as needed for moderate pain or severe pain.  09/15/17   Daleen Bo, MD  venlafaxine XR (EFFEXOR-XR) 150 MG 24 hr capsule Take 150 mg by mouth daily with breakfast.    [provider]  VENTOLIN HFA 108 (90 Base) MCG/ACT inhaler Take 2 puffs by mouth every 4 (four) hours as needed. 11/15/17   [provider]  vitamin C (ASCORBIC ACID) 500 MG tablet Take 1,000 mg by mouth daily.    [provider]  vitamin E 1000 UNIT capsule Take 1,000 Units by mouth daily.    [provider]    Family History Family History  Problem Relation Age of Onset  . Heart attack Father     Social History Social History   Tobacco Use  . Smoking status: Former Research scientist (life sciences)  . Smokeless tobacco: Never Used  . Tobacco comment: As a teenager.  Substance Use Topics  . Alcohol use: No    Comment: Occasional wine in the past  . Drug use: No     Allergies   Influenza vaccines; Paliperidone; Aspirin; Penicillins; and Sulfa antibiotics   Review of Systems Review of Systems  Constitutional: Positive for fatigue. Negative for fever.  Respiratory: Positive for cough.   Cardiovascular: Positive for chest pain.  Gastrointestinal: Positive for abdominal pain, diarrhea, nausea and vomiting. Negative for blood in stool.  Genitourinary: Positive for dysuria and frequency.  All other systems reviewed and are negative.    Physical Exam Updated Vital Signs BP (!) 173/87    Pulse (!) 54   Temp 98.7 F (37.1 C) (Oral)   Resp 16   SpO2 99%   Physical Exam CONSTITUTIONAL: elderly, no acute distress HEAD: Normocephalic/atraumatic EYES: EOMI/PERRL ENMT: Mucous membranes moist NECK: supple no meningeal signs SPINE/BACK:entire spine nontender CV: S1/S2 noted, murmur noted LUNGS: Lungs are clear to auscultation bilaterally, no apparent distress ABDOMEN: soft, nontender, no rebound or guarding, bowel sounds noted throughout abdomen GU:no cva tenderness NEURO: Pt is awake/alert/appropriate, moves all extremitiesx4.  No facial droop.   EXTREMITIES: pulses normal/equal, full ROM SKIN: warm, color normal PSYCH: no abnormalities of mood noted, alert and oriented to situation   ED Treatments / Results  Labs (all labs ordered are listed, but only abnormal results are displayed) Labs Reviewed  COMPREHENSIVE METABOLIC PANEL -  Abnormal; Notable for the following components:      Result Value   Potassium 3.2 (*)    Creatinine, Ser 1.02 (*)    Albumin 3.4 (*)    ALT 11 (*)    GFR calc non Af Amer 51 (*)    GFR calc Af Amer 59 (*)    All other components within normal limits  CBC - Abnormal; Notable for the following components:   RBC 3.77 (*)    Hemoglobin 10.5 (*)    HCT 33.7 (*)    All other components within normal limits  LIPASE, BLOOD  URINALYSIS, ROUTINE W REFLEX MICROSCOPIC  I-STAT TROPONIN, ED    EKG EKG Interpretation  Date/Time:  Sunday November 24 2017 04:36:26 EDT Ventricular Rate:  50 PR Interval:    QRS Duration: 95 QT Interval:  425 QTC Calculation: 388 R Axis:   67 Text Interpretation:  Sinus rhythm Atrial premature complex LVH with secondary repolarization abnormality No significant change since last tracing Confirmed by Ripley Fraise [phone]) on 11/24/2017 5:03:07 AM   Radiology Dg Chest 2 View  Result Date: 11/24/2017 CLINICAL DATA:  Acute onset of productive cough. EXAM: CHEST - 2 VIEW COMPARISON:  Chest radiograph performed  11/18/2017 FINDINGS: The lungs are well-aerated. Mild vascular congestion is suggested. There is no evidence of focal opacification, pleural effusion or pneumothorax. The heart is borderline enlarged; the mediastinal contour is within normal limits. No acute osseous abnormalities are seen. IMPRESSION: Mild vascular congestion suggested; lungs remain grossly clear. Electronically Signed   By: Garald Balding M.D.   On: 11/24/2017 05:21    Procedures Procedures  Medications Ordered in ED Medications - No data to display   Initial Impression / Assessment and Plan / ED Course  I have reviewed the triage vital signs and the nursing notes.  Pertinent labs & imaging results that were available during my care of the patient were reviewed by me and considered in my medical decision making (see chart for details).     6:17 AM Patient here from nursing facility initially for chest pain, but then she mentions multiple other complaints. Labs reassuring, EKG unchanged.  Chest x-ray reviewed and unremarkable.  Attempted to call the facility, but no one answered. My suspicion for ACS is low.  No signs of acute abdominal emergency at this time. 6:52 AM  Patient reports mild improvement.  She reports her main concern is recent diarrhea.  She reports generalized fatigue. No other acute complaints at this time.  She prefers to be discharged.  I advised her to have recheck with her PCP if diarrhea continues this week.  Final Clinical Impressions(s) / ED Diagnoses   Final diagnoses:  Generalized abdominal pain  Diarrhea, unspecified type  Precordial pain    ED Discharge Orders    None       Ripley Fraise, MD 11/24/17 [phone]

## 2017-11-28 ENCOUNTER — Emergency Department (HOSPITAL_COMMUNITY)
Admission: EM | Admit: 2017-11-28 | Discharge: 2017-11-28 | Disposition: A | Discharge status: Home / Self Care | Payer: Medicare Other | Attending: Emergency Medicine | Admitting: Emergency Medicine

## 2017-11-28 DIAGNOSIS — Z87891 Personal history of nicotine dependence: Secondary | ICD-10-CM | POA: Diagnosis not present

## 2017-11-28 DIAGNOSIS — Z859 Personal history of malignant neoplasm, unspecified: Secondary | ICD-10-CM | POA: Diagnosis not present

## 2017-11-28 DIAGNOSIS — J45909 Unspecified asthma, uncomplicated: Secondary | ICD-10-CM | POA: Insufficient documentation

## 2017-11-28 DIAGNOSIS — F0390 Unspecified dementia without behavioral disturbance: Secondary | ICD-10-CM | POA: Diagnosis not present

## 2017-11-28 DIAGNOSIS — I110 Hypertensive heart disease with heart failure: Secondary | ICD-10-CM | POA: Insufficient documentation

## 2017-11-28 DIAGNOSIS — M2550 Pain in unspecified joint: Secondary | ICD-10-CM | POA: Insufficient documentation

## 2017-11-28 DIAGNOSIS — Z79899 Other long term (current) drug therapy: Secondary | ICD-10-CM | POA: Insufficient documentation

## 2017-11-28 DIAGNOSIS — I509 Heart failure, unspecified: Secondary | ICD-10-CM | POA: Diagnosis not present

## 2017-11-28 LAB — CBC WITH DIFFERENTIAL/PLATELET
Basophils Absolute: 0 10*3/uL (ref 0.0–0.1)
Basophils Relative: 0 %
EOS ABS: 0.1 10*3/uL (ref 0.0–0.7)
Eosinophils Relative: 1 %
HCT: 38.3 % (ref 36.0–46.0)
HEMOGLOBIN: 12.2 g/dL (ref 12.0–15.0)
LYMPHS ABS: 2.6 10*3/uL (ref 0.7–4.0)
Lymphocytes Relative: 32 %
MCH: 28.6 pg (ref 26.0–34.0)
MCHC: 31.9 g/dL (ref 30.0–36.0)
MCV: 89.7 fL (ref 78.0–100.0)
MONO ABS: 0.7 10*3/uL (ref 0.1–1.0)
MONOS PCT: 9 %
NEUTROS PCT: 58 %
Neutro Abs: 4.6 10*3/uL (ref 1.7–7.7)
Platelets: 241 10*3/uL (ref 150–400)
RBC: 4.27 MIL/uL (ref 3.87–5.11)
RDW: 15.7 % — AB (ref 11.5–15.5)
WBC: 7.9 10*3/uL (ref 4.0–10.5)

## 2017-11-28 LAB — BASIC METABOLIC PANEL
Anion gap: 10 (ref 5–15)
BUN: 15 mg/dL (ref 6–20)
CHLORIDE: 104 mmol/L (ref 101–111)
CO2: 29 mmol/L (ref 22–32)
CREATININE: 0.94 mg/dL (ref 0.44–1.00)
Calcium: 9.4 mg/dL (ref 8.9–10.3)
GFR calc Af Amer: >60 mL/min (ref >60)
GFR calc non Af Amer: 56 mL/min — ABNORMAL LOW (ref >60)
Glucose, Bld: 83 mg/dL (ref 65–99)
Potassium: 3.3 mmol/L — ABNORMAL LOW (ref 3.5–5.1)
SODIUM: 143 mmol/L (ref 135–145)

## 2017-11-28 LAB — SEDIMENTATION RATE: Sed Rate: 8 mm/hr (ref 0–22)

## 2017-11-28 MED ORDER — IBUPROFEN 200 MG PO TABS
400.0000 mg | ORAL_TABLET | Freq: Once | ORAL | Status: AC
  Administered 2017-11-28: 400 mg via ORAL
  Filled 2017-11-28: qty 2

## 2017-11-28 NOTE — ED Triage Notes (Signed)
Transported by [employer] [facility] from [facility]. Pt reports that facility told her that she had a BP of "200 something" this morning. No other reported complaints per EMS. AAO x 4.

## 2017-11-28 NOTE — ED Notes (Signed)
Bed: WP79 Expected date:  Expected time:  Means of arrival:  Comments: EMS-HTN

## 2017-11-28 NOTE — ED Notes (Signed)
[facility] has been contacted regarding patient transport.  

## 2017-11-28 NOTE — ED Provider Notes (Signed)
[facility] DEPT Provider Note   CSN: [id number] Arrival date & time: 11/28/17  0944     History   Chief Complaint Chief Complaint  Patient presents with  . Hypertension    HPI Kerri Hernandez is a 82 y.o. female.  Level 5 caveat for mild dementia.  Patient is most concerned about arthritic joint pain.  Allegedly her blood pressure was "200 something" earlier today.  No chest pain, dyspnea, neuro deficits, fever, sweats, chills.  Other than joint pain, patient has no somatic complaints.     Past Medical History:  Diagnosis Date  . Arthritis   . Asthma   . Cancer ([place])   . CHF (congestive heart failure) ([place])   . Hypertension   . TIA (transient ischemic attack)     Patient Active Problem List   Diagnosis Date Noted  . Pharyngeal foreign body 08/09/2017  . Pressure injury of skin 04/30/2017  . Chest pain 04/29/2017  . Hypertension 04/29/2017  . Aortic valve regurgitation     Past Surgical History:  Procedure Laterality Date  . ABDOMINAL HYSTERECTOMY    . DIRECT LARYNGOSCOPY N/A 08/09/2017   Procedure: DIRECT  laryngoscopy and rigid esophagoscopy;  Surgeon: Melida Quitter, MD;  Location: [facility];  Service: ENT;  Laterality: N/A;  . MASTECTOMY       OB History   None      Home Medications    Prior to Admission medications   Medication Sig Start Date End Date Taking? Authorizing Provider  acetaminophen (TYLENOL) 500 MG tablet Take 2 tablets (1,000 mg total) by mouth every 6 (six) hours as needed. Patient taking differently: Take 1,000 mg by mouth every 6 (six) hours as needed for mild pain, moderate pain, fever or headache.  07/31/17  Yes Charlesetta Shanks, MD  amLODipine (NORVASC) 10 MG tablet Take 1 tablet (10 mg total) daily by mouth. 05/01/17  Yes Burgess Estelle, MD  ARIPiprazole (ABILIFY) 15 MG tablet Take 15 mg by mouth daily. 11/04/17  Yes [provider]  Cholecalciferol (VITAMIN D PO) Take 1,000 Units by mouth daily.     Yes [provider]  docusate sodium (COLACE) 100 MG capsule Take 1 capsule (100 mg total) by mouth every 12 (twelve) hours. 09/28/17  Yes Tanna Furry, MD  ferrous sulfate 325 (65 FE) MG tablet Take 325 mg by mouth daily.   Yes [provider]  fluticasone (FLONASE) 50 MCG/ACT nasal spray Place 2 sprays into both nostrils daily.    Yes [provider]  furosemide (LASIX) 20 MG tablet Take 20 mg daily by mouth.   Yes [provider]  hydrALAZINE (APRESOLINE) 10 MG tablet Take 1 tablet (10 mg total) by mouth 3 (three) times daily. To use as needed if patient's blood pressure remains greater than 150/80 when taking regular medications as prescribed. Patient taking differently: Take 10 mg by mouth daily.  07/31/17  Yes Charlesetta Shanks, MD  loratadine (CLARITIN) 10 MG tablet Take 10 mg by mouth daily.   Yes [provider]  losartan (COZAAR) 100 MG tablet Take 100 mg by mouth daily.   Yes [provider]  oxybutynin (DITROPAN) 5 MG tablet Take 2.5 mg by mouth 2 (two) times daily.   Yes [provider]  pantoprazole (PROTONIX) 40 MG tablet Take 40 mg by mouth daily. Do not crush   Yes [provider]  potassium chloride SA (K-DUR,KLOR-CON) 20 MEQ tablet Take 1 tablet (20 mEq total) by mouth 2 (two) times  daily. 09/21/78  Yes Delora Fuel, MD  traMADol-acetaminophen (ULTRACET) 37.5-325 MG tablet Take 1 tablet by mouth every 6 (six) hours as needed. Patient taking differently: Take 1 tablet by mouth every 6 (six) hours as needed for moderate pain or severe pain.  09/15/17  Yes Daleen Bo, MD  venlafaxine XR (EFFEXOR-XR) 150 MG 24 hr capsule Take 150 mg by mouth daily with breakfast.   Yes [provider]  VENTOLIN HFA 108 (90 Base) MCG/ACT inhaler Take 2 puffs by mouth every 4 (four) hours as needed. 11/15/17  Yes [provider]  vitamin C (ASCORBIC ACID) 500 MG tablet Take 1,000 mg by mouth daily.   Yes [provider]  vitamin E 1000 UNIT capsule Take 1,000 Units by mouth daily.   Yes [provider]  nitroGLYCERIN (NITROSTAT) 0.4 MG SL tablet Place 0.4 mg under the tongue every 5 (five) minutes as needed for chest pain. For up to 3 consecutive doses *Do not crush*    [provider]  phenazopyridine (PYRIDIUM) 200 MG tablet Take 1 tablet (200 mg total) by mouth 3 (three) times daily. Patient not taking: Reported on 9/98/3382 5/0/53   Delora Fuel, MD    Family History Family History  Problem Relation Age of Onset  . Heart attack Father     Social History Social History   Tobacco Use  . Smoking status: Former Research scientist (life sciences)  . Smokeless tobacco: Never Used  . Tobacco comment: As a teenager.  Substance Use Topics  . Alcohol use: No    Comment: Occasional wine in the past  . Drug use: No     Allergies   Influenza vaccines; Paliperidone; Aspirin; Penicillins; and Sulfa antibiotics   Review of Systems Review of Systems  All other systems reviewed and are negative.    Physical Exam Updated Vital Signs BP (!) 144/81   Pulse 62   Temp 98.4 F (36.9 C) (Oral)   Resp 18   Ht 5\' 4"  (1.626 m)   Wt 59.9 kg (132 lb)   SpO2 100%   BMI 22.66 kg/m   Physical Exam  Constitutional: She is oriented to person, place, and time. She appears well-developed and well-nourished.  HENT:  Head: Normocephalic and atraumatic.  Eyes: Conjunctivae are normal.  Neck: Neck supple.  Cardiovascular: Normal rate and regular rhythm.  Pulmonary/Chest: Effort normal and breath sounds normal.  Abdominal: Soft. Bowel sounds are normal.  Musculoskeletal: Normal range of motion.  Neurological: She is alert and oriented to person, place, and time.  Skin: Skin is warm and dry.  Psychiatric: She has a normal mood and affect. Her behavior is normal.  Nursing note and vitals reviewed.    ED Treatments / Results  Labs (all labs ordered are listed, but only abnormal results are  displayed) Labs Reviewed  CBC WITH DIFFERENTIAL/PLATELET - Abnormal; Notable for the following components:      Result Value   RDW 15.7 (*)    All other components within normal limits  BASIC METABOLIC PANEL - Abnormal; Notable for the following components:   Potassium 3.3 (*)    GFR calc non Af Amer 56 (*)    All other components within normal limits  SEDIMENTATION RATE    EKG None  Radiology No results found.  Procedures Procedures (including critical care time)  Medications Ordered in ED Medications  ibuprofen (ADVIL,MOTRIN) tablet 400 mg (400 mg Oral Given 11/28/17 1057)     Initial Impression / Assessment and Plan / ED Course  I have reviewed the triage vital signs and the nursing notes.  Pertinent labs & imaging results that were available during my care of the patient were reviewed by me and considered in my medical decision making (see chart for details).     Patient is most concerned about her arthritic joint pain.  Allegedly her blood pressure was elevated earlier today.  She has no complaints related to this concern.  She is alert with a normal physical exam.  Screening labs and sed rate normal.  Blood pressure can be followed by her primary care doctor.  Final Clinical Impressions(s) / ED Diagnoses   Final diagnoses:  Arthralgia, unspecified joint    ED Discharge Orders    None       Nat Christen, MD 11/28/17 1355

## 2017-11-28 NOTE — Discharge Instructions (Addendum)
Blood work was acceptable.  Recommend ibuprofen for joint pain.  Blood pressure was minimally elevated.  This can be managed by her primary care doctor.

## 2017-11-29 ENCOUNTER — Emergency Department (HOSPITAL_COMMUNITY): Payer: Medicare Other

## 2017-11-29 ENCOUNTER — Observation Stay (HOSPITAL_COMMUNITY): Payer: Medicare Other

## 2017-11-29 ENCOUNTER — Observation Stay (HOSPITAL_COMMUNITY)
Admission: EM | Admit: 2017-11-29 | Discharge: 2017-11-30 | Disposition: A | Discharge status: Home / Self Care | Payer: Medicare Other | Attending: Internal Medicine | Admitting: Internal Medicine

## 2017-11-29 ENCOUNTER — Other Ambulatory Visit: Payer: Self-pay

## 2017-11-29 ENCOUNTER — Encounter (HOSPITAL_COMMUNITY): Payer: Self-pay | Admitting: Emergency Medicine

## 2017-11-29 DIAGNOSIS — R110 Nausea: Secondary | ICD-10-CM | POA: Diagnosis not present

## 2017-11-29 DIAGNOSIS — E876 Hypokalemia: Secondary | ICD-10-CM

## 2017-11-29 DIAGNOSIS — I351 Nonrheumatic aortic (valve) insufficiency: Secondary | ICD-10-CM | POA: Diagnosis not present

## 2017-11-29 DIAGNOSIS — I5032 Chronic diastolic (congestive) heart failure: Secondary | ICD-10-CM | POA: Diagnosis present

## 2017-11-29 DIAGNOSIS — R079 Chest pain, unspecified: Principal | ICD-10-CM | POA: Diagnosis present

## 2017-11-29 DIAGNOSIS — N183 Chronic kidney disease, stage 3 unspecified: Secondary | ICD-10-CM | POA: Diagnosis present

## 2017-11-29 DIAGNOSIS — I10 Essential (primary) hypertension: Secondary | ICD-10-CM | POA: Diagnosis present

## 2017-11-29 DIAGNOSIS — Z8673 Personal history of transient ischemic attack (TIA), and cerebral infarction without residual deficits: Secondary | ICD-10-CM | POA: Insufficient documentation

## 2017-11-29 DIAGNOSIS — I509 Heart failure, unspecified: Secondary | ICD-10-CM | POA: Insufficient documentation

## 2017-11-29 DIAGNOSIS — R61 Generalized hyperhidrosis: Secondary | ICD-10-CM | POA: Diagnosis not present

## 2017-11-29 DIAGNOSIS — R0600 Dyspnea, unspecified: Secondary | ICD-10-CM | POA: Insufficient documentation

## 2017-11-29 DIAGNOSIS — M159 Polyosteoarthritis, unspecified: Secondary | ICD-10-CM | POA: Diagnosis present

## 2017-11-29 DIAGNOSIS — Z859 Personal history of malignant neoplasm, unspecified: Secondary | ICD-10-CM | POA: Diagnosis not present

## 2017-11-29 DIAGNOSIS — Z79899 Other long term (current) drug therapy: Secondary | ICD-10-CM | POA: Diagnosis not present

## 2017-11-29 DIAGNOSIS — I517 Cardiomegaly: Secondary | ICD-10-CM | POA: Diagnosis not present

## 2017-11-29 DIAGNOSIS — I110 Hypertensive heart disease with heart failure: Secondary | ICD-10-CM | POA: Diagnosis not present

## 2017-11-29 DIAGNOSIS — Y69 Unspecified misadventure during surgical and medical care: Secondary | ICD-10-CM | POA: Diagnosis not present

## 2017-11-29 DIAGNOSIS — T887XXA Unspecified adverse effect of drug or medicament, initial encounter: Secondary | ICD-10-CM | POA: Insufficient documentation

## 2017-11-29 DIAGNOSIS — T502X5A Adverse effect of carbonic-anhydrase inhibitors, benzothiadiazides and other diuretics, initial encounter: Secondary | ICD-10-CM | POA: Insufficient documentation

## 2017-11-29 DIAGNOSIS — F0390 Unspecified dementia without behavioral disturbance: Secondary | ICD-10-CM | POA: Diagnosis present

## 2017-11-29 DIAGNOSIS — R32 Unspecified urinary incontinence: Secondary | ICD-10-CM | POA: Diagnosis present

## 2017-11-29 DIAGNOSIS — J3089 Other allergic rhinitis: Secondary | ICD-10-CM | POA: Diagnosis present

## 2017-11-29 DIAGNOSIS — D631 Anemia in chronic kidney disease: Secondary | ICD-10-CM | POA: Diagnosis present

## 2017-11-29 LAB — DIFFERENTIAL
Abs Immature Granulocytes: 0 10*3/uL (ref 0.0–0.1)
Basophils Absolute: 0.1 10*3/uL (ref 0.0–0.1)
Basophils Relative: 1 %
Eosinophils Absolute: 0.1 10*3/uL (ref 0.0–0.7)
Eosinophils Relative: 2 %
Immature Granulocytes: 0 %
LYMPHS PCT: 37 %
Lymphs Abs: 2.4 10*3/uL (ref 0.7–4.0)
MONO ABS: 0.7 10*3/uL (ref 0.1–1.0)
MONOS PCT: 11 %
NEUTROS ABS: 3.3 10*3/uL (ref 1.7–7.7)
Neutrophils Relative %: 49 %

## 2017-11-29 LAB — BASIC METABOLIC PANEL
Anion gap: 9 (ref 5–15)
BUN: 17 mg/dL (ref 6–20)
CALCIUM: 9.4 mg/dL (ref 8.9–10.3)
CHLORIDE: 102 mmol/L (ref 101–111)
CO2: 29 mmol/L (ref 22–32)
CREATININE: 1.16 mg/dL — AB (ref 0.44–1.00)
GFR calc Af Amer: 51 mL/min — ABNORMAL LOW (ref >60)
GFR calc non Af Amer: 44 mL/min — ABNORMAL LOW (ref >60)
Glucose, Bld: 92 mg/dL (ref 65–99)
Potassium: 3.4 mmol/L — ABNORMAL LOW (ref 3.5–5.1)
Sodium: 140 mmol/L (ref 135–145)

## 2017-11-29 LAB — CBC
HCT: 35.3 % — ABNORMAL LOW (ref 36.0–46.0)
Hemoglobin: 11 g/dL — ABNORMAL LOW (ref 12.0–15.0)
MCH: 27.4 pg (ref 26.0–34.0)
MCHC: 31.2 g/dL (ref 30.0–36.0)
MCV: 88 fL (ref 78.0–100.0)
PLATELETS: 258 10*3/uL (ref 150–400)
RBC: 4.01 MIL/uL (ref 3.87–5.11)
RDW: 15.5 % (ref 11.5–15.5)
WBC: 6.7 10*3/uL (ref 4.0–10.5)

## 2017-11-29 LAB — IRON AND TIBC
IRON: 63 ug/dL (ref 28–170)
Saturation Ratios: 27 % (ref 10.4–31.8)
TIBC: 232 ug/dL — ABNORMAL LOW (ref 250–450)
UIBC: 169 ug/dL

## 2017-11-29 LAB — TROPONIN I
Troponin I: <0.03 ng/mL (ref <0.03)
Troponin I: <0.03 ng/mL (ref <0.03)
Troponin I: <0.03 ng/mL (ref <0.03)

## 2017-11-29 LAB — I-STAT TROPONIN, ED: Troponin i, poc: 0.01 ng/mL (ref 0.00–0.08)

## 2017-11-29 LAB — RETICULOCYTES
RBC.: 3.88 MIL/uL (ref 3.87–5.11)
RETIC COUNT ABSOLUTE: 34.9 10*3/uL (ref 19.0–186.0)
Retic Ct Pct: 0.9 % (ref 0.4–3.1)

## 2017-11-29 LAB — VITAMIN B12: VITAMIN B 12: 587 pg/mL (ref 180–914)

## 2017-11-29 LAB — FERRITIN: Ferritin: 65 ng/mL (ref 11–307)

## 2017-11-29 LAB — FOLATE: FOLATE: 15.3 ng/mL (ref >5.9)

## 2017-11-29 LAB — MRSA PCR SCREENING: MRSA by PCR: NEGATIVE

## 2017-11-29 MED ORDER — HEPARIN SODIUM (PORCINE) 5000 UNIT/ML IJ SOLN
5000.0000 [IU] | Freq: Three times a day (TID) | INTRAMUSCULAR | Status: DC
  Administered 2017-11-29 – 2017-11-30 (×3): 5000 [IU] via SUBCUTANEOUS
  Filled 2017-11-29 (×3): qty 1

## 2017-11-29 MED ORDER — AMLODIPINE BESYLATE 10 MG PO TABS
10.0000 mg | ORAL_TABLET | Freq: Every day | ORAL | Status: DC
  Administered 2017-11-29 – 2017-11-30 (×2): 10 mg via ORAL
  Filled 2017-11-29 (×3): qty 1

## 2017-11-29 MED ORDER — FUROSEMIDE 20 MG PO TABS
20.0000 mg | ORAL_TABLET | Freq: Every day | ORAL | Status: DC
  Administered 2017-11-29 – 2017-11-30 (×2): 20 mg via ORAL
  Filled 2017-11-29 (×2): qty 1

## 2017-11-29 MED ORDER — VITAMIN C 500 MG PO TABS
1000.0000 mg | ORAL_TABLET | Freq: Every day | ORAL | Status: DC
  Administered 2017-11-29 – 2017-11-30 (×2): 1000 mg via ORAL
  Filled 2017-11-29 (×2): qty 2

## 2017-11-29 MED ORDER — ONDANSETRON HCL 4 MG/2ML IJ SOLN
4.0000 mg | Freq: Four times a day (QID) | INTRAMUSCULAR | Status: DC | PRN
Start: 2017-11-29 — End: 2017-11-30

## 2017-11-29 MED ORDER — VENLAFAXINE HCL ER 150 MG PO CP24
150.0000 mg | ORAL_CAPSULE | Freq: Every day | ORAL | Status: DC
  Administered 2017-11-29 – 2017-11-30 (×2): 150 mg via ORAL
  Filled 2017-11-29 (×3): qty 1

## 2017-11-29 MED ORDER — LOSARTAN POTASSIUM 50 MG PO TABS
100.0000 mg | ORAL_TABLET | Freq: Every day | ORAL | Status: DC
  Administered 2017-11-30: 100 mg via ORAL
  Filled 2017-11-29 (×2): qty 2

## 2017-11-29 MED ORDER — POTASSIUM CHLORIDE CRYS ER 20 MEQ PO TBCR
20.0000 meq | EXTENDED_RELEASE_TABLET | Freq: Two times a day (BID) | ORAL | Status: DC
  Administered 2017-11-29 – 2017-11-30 (×3): 20 meq via ORAL
  Filled 2017-11-29 (×3): qty 1

## 2017-11-29 MED ORDER — FERROUS SULFATE 325 (65 FE) MG PO TABS
325.0000 mg | ORAL_TABLET | Freq: Every day | ORAL | Status: DC
  Administered 2017-11-29 – 2017-11-30 (×2): 325 mg via ORAL
  Filled 2017-11-29 (×2): qty 1

## 2017-11-29 MED ORDER — OXYBUTYNIN CHLORIDE 5 MG PO TABS
2.5000 mg | ORAL_TABLET | Freq: Two times a day (BID) | ORAL | Status: DC
  Administered 2017-11-29 – 2017-11-30 (×3): 2.5 mg via ORAL
  Filled 2017-11-29 (×3): qty 1

## 2017-11-29 MED ORDER — VITAMIN E 180 MG (400 UNIT) PO CAPS
1000.0000 [IU] | ORAL_CAPSULE | Freq: Every day | ORAL | Status: DC
  Administered 2017-11-29 – 2017-11-30 (×2): 1000 [IU] via ORAL
  Filled 2017-11-29 (×2): qty 2

## 2017-11-29 MED ORDER — TRAMADOL-ACETAMINOPHEN 37.5-325 MG PO TABS
1.0000 | ORAL_TABLET | Freq: Four times a day (QID) | ORAL | Status: DC | PRN
  Administered 2017-11-30: 1 via ORAL
  Filled 2017-11-29: qty 1

## 2017-11-29 MED ORDER — MORPHINE SULFATE (PF) 4 MG/ML IV SOLN
1.0000 mg | INTRAVENOUS | Status: DC | PRN
Start: 2017-11-29 — End: 2017-11-30
  Administered 2017-11-29: 2 mg via INTRAVENOUS

## 2017-11-29 MED ORDER — FLUTICASONE PROPIONATE 50 MCG/ACT NA SUSP
2.0000 | Freq: Every day | NASAL | Status: DC
  Administered 2017-11-29 – 2017-11-30 (×2): 2 via NASAL
  Filled 2017-11-29: qty 16

## 2017-11-29 MED ORDER — NITROGLYCERIN 0.4 MG SL SUBL
0.4000 mg | SUBLINGUAL_TABLET | SUBLINGUAL | Status: DC | PRN
  Administered 2017-11-29 (×3): 0.4 mg via SUBLINGUAL
  Filled 2017-11-29: qty 1

## 2017-11-29 MED ORDER — DOCUSATE SODIUM 100 MG PO CAPS
100.0000 mg | ORAL_CAPSULE | Freq: Two times a day (BID) | ORAL | Status: DC
  Administered 2017-11-29 – 2017-11-30 (×3): 100 mg via ORAL
  Filled 2017-11-29 (×3): qty 1

## 2017-11-29 MED ORDER — NITROGLYCERIN 0.4 MG SL SUBL
SUBLINGUAL_TABLET | SUBLINGUAL | Status: AC
  Filled 2017-11-29: qty 2

## 2017-11-29 MED ORDER — TRAMADOL-ACETAMINOPHEN 37.5-325 MG PO TABS: 1.0000 | ORAL_TABLET | Freq: Four times a day (QID) | ORAL | Status: DC | PRN

## 2017-11-29 MED ORDER — PANTOPRAZOLE SODIUM 40 MG PO TBEC
40.0000 mg | DELAYED_RELEASE_TABLET | Freq: Every day | ORAL | Status: DC
  Administered 2017-11-29 – 2017-11-30 (×2): 40 mg via ORAL
  Filled 2017-11-29 (×2): qty 1

## 2017-11-29 MED ORDER — ALBUTEROL SULFATE (2.5 MG/3ML) 0.083% IN NEBU: 3.0000 mL | INHALATION_SOLUTION | RESPIRATORY_TRACT | Status: DC | PRN

## 2017-11-29 MED ORDER — IOPAMIDOL (ISOVUE-370) INJECTION 76%
INTRAVENOUS | Status: AC
  Administered 2017-11-29: 80 mL
  Filled 2017-11-29: qty 100

## 2017-11-29 MED ORDER — HYDRALAZINE HCL 20 MG/ML IJ SOLN: 5.0000 mg | Freq: Three times a day (TID) | INTRAMUSCULAR | Status: DC | PRN

## 2017-11-29 MED ORDER — ARIPIPRAZOLE 5 MG PO TABS
15.0000 mg | ORAL_TABLET | Freq: Every day | ORAL | Status: DC
  Administered 2017-11-29 – 2017-11-30 (×2): 15 mg via ORAL
  Filled 2017-11-29 (×2): qty 3

## 2017-11-29 MED ORDER — VITAMIN D 1000 UNITS PO TABS
1000.0000 [IU] | ORAL_TABLET | Freq: Every day | ORAL | Status: DC
  Administered 2017-11-29 – 2017-11-30 (×2): 1000 [IU] via ORAL
  Filled 2017-11-29 (×2): qty 1

## 2017-11-29 MED ORDER — HYDRALAZINE HCL 10 MG PO TABS
10.0000 mg | ORAL_TABLET | Freq: Three times a day (TID) | ORAL | Status: DC
  Administered 2017-11-29 – 2017-11-30 (×4): 10 mg via ORAL
  Filled 2017-11-29 (×4): qty 1

## 2017-11-29 MED ORDER — NITROGLYCERIN 0.4 MG SL SUBL
0.8000 mg | SUBLINGUAL_TABLET | SUBLINGUAL | Status: DC | PRN
  Administered 2017-11-29: 0.8 mg via SUBLINGUAL

## 2017-11-29 MED ORDER — MORPHINE SULFATE (PF) 4 MG/ML IV SOLN
2.0000 mg | INTRAVENOUS | Status: DC | PRN
  Filled 2017-11-29: qty 1

## 2017-11-29 MED ORDER — LORATADINE 10 MG PO TABS
10.0000 mg | ORAL_TABLET | Freq: Every day | ORAL | Status: DC
  Administered 2017-11-29 – 2017-11-30 (×2): 10 mg via ORAL
  Filled 2017-11-29 (×2): qty 1

## 2017-11-29 MED ORDER — GI COCKTAIL ~~LOC~~
30.0000 mL | Freq: Four times a day (QID) | ORAL | Status: DC | PRN
  Administered 2017-11-29: 30 mL via ORAL
  Filled 2017-11-29: qty 30

## 2017-11-29 NOTE — ED Provider Notes (Signed)
[facility] EMERGENCY DEPARTMENT Provider Note   CSN: [id number] Arrival date & time: 11/29/17  4098     History   Chief Complaint Chief Complaint  Patient presents with  . Chest Pain    HPI Kerri Hernandez is a 82 y.o. female.  The history is provided by the patient.  She has history of hypertension, heart failure, asthma, arthritis, cancer and comes in complaining of chest pain throughout the day.  She describes it as a dull pain, but is unable to give anything else in terms of description.  She does complain of some mild nausea, dyspnea, diaphoresis.  She has not noticed anything that made pain better or worse.  She could not rate the pain other than to say that it was not a 10.  She came in by ambulance who gave her aspirin and nitroglycerin, which did give some incomplete relief of pain.  She is a non-smoker and has no history of diabetes or hyperlipidemia.  Past Medical History:  Diagnosis Date  . Arthritis   . Asthma   . Cancer ([place])   . CHF (congestive heart failure) ([place])   . Hypertension   . TIA (transient ischemic attack)     Patient Active Problem List   Diagnosis Date Noted  . Pharyngeal foreign body 08/09/2017  . Pressure injury of skin 04/30/2017  . Chest pain 04/29/2017  . Hypertension 04/29/2017  . Aortic valve regurgitation     Past Surgical History:  Procedure Laterality Date  . ABDOMINAL HYSTERECTOMY    . DIRECT LARYNGOSCOPY N/A 08/09/2017   Procedure: DIRECT  laryngoscopy and rigid esophagoscopy;  Surgeon: Melida Quitter, MD;  Location: [facility];  Service: ENT;  Laterality: N/A;  . MASTECTOMY       OB History   None      Home Medications    Prior to Admission medications   Medication Sig Start Date End Date Taking? Authorizing Provider  acetaminophen (TYLENOL) 500 MG tablet Take 2 tablets (1,000 mg total) by mouth every 6 (six) hours as needed. Patient taking differently: Take 1,000 mg by mouth every 6 (six) hours as needed  for mild pain, moderate pain, fever or headache.  07/31/17   Charlesetta Shanks, MD  amLODipine (NORVASC) 10 MG tablet Take 1 tablet (10 mg total) daily by mouth. 05/01/17   Burgess Estelle, MD  ARIPiprazole (ABILIFY) 15 MG tablet Take 15 mg by mouth daily. 11/04/17   [provider]  Cholecalciferol (VITAMIN D PO) Take 1,000 Units by mouth daily.     [provider]  docusate sodium (COLACE) 100 MG capsule Take 1 capsule (100 mg total) by mouth every 12 (twelve) hours. 09/28/17   Tanna Furry, MD  ferrous sulfate 325 (65 FE) MG tablet Take 325 mg by mouth daily.    [provider]  fluticasone (FLONASE) 50 MCG/ACT nasal spray Place 2 sprays into both nostrils daily.     [provider]  furosemide (LASIX) 20 MG tablet Take 20 mg daily by mouth.    [provider]  hydrALAZINE (APRESOLINE) 10 MG tablet Take 1 tablet (10 mg total) by mouth 3 (three) times daily. To use as needed if patient's blood pressure remains greater than 150/80 when taking regular medications as prescribed. Patient taking differently: Take 10 mg by mouth daily.  07/31/17   Charlesetta Shanks, MD  loratadine (CLARITIN) 10 MG tablet Take 10 mg by mouth daily.    [provider]  losartan (COZAAR) 100 MG tablet  Take 100 mg by mouth daily.    [provider]  nitroGLYCERIN (NITROSTAT) 0.4 MG SL tablet Place 0.4 mg under the tongue every 5 (five) minutes as needed for chest pain. For up to 3 consecutive doses *Do not crush*    [provider]  oxybutynin (DITROPAN) 5 MG tablet Take 2.5 mg by mouth 2 (two) times daily.    [provider]  pantoprazole (PROTONIX) 40 MG tablet Take 40 mg by mouth daily. Do not crush    [provider]  phenazopyridine (PYRIDIUM) 200 MG tablet Take 1 tablet (200 mg total) by mouth 3 (three) times daily. Patient not taking: Reported on 5/99/3570 06/24/77   Delora Fuel, MD  potassium chloride SA (K-DUR,KLOR-CON) 20 MEQ tablet  Take 1 tablet (20 mEq total) by mouth 2 (two) times daily. 08/24/01   Delora Fuel, MD  traMADol-acetaminophen (ULTRACET) 37.5-325 MG tablet Take 1 tablet by mouth every 6 (six) hours as needed. Patient taking differently: Take 1 tablet by mouth every 6 (six) hours as needed for moderate pain or severe pain.  09/15/17   Daleen Bo, MD  venlafaxine XR (EFFEXOR-XR) 150 MG 24 hr capsule Take 150 mg by mouth daily with breakfast.    [provider]  VENTOLIN HFA 108 (90 Base) MCG/ACT inhaler Take 2 puffs by mouth every 4 (four) hours as needed. 11/15/17   [provider]  vitamin C (ASCORBIC ACID) 500 MG tablet Take 1,000 mg by mouth daily.    [provider]  vitamin E 1000 UNIT capsule Take 1,000 Units by mouth daily.    [provider]    Family History Family History  Problem Relation Age of Onset  . Heart attack Father     Social History Social History   Tobacco Use  . Smoking status: Former Research scientist (life sciences)  . Smokeless tobacco: Never Used  . Tobacco comment: As a teenager.  Substance Use Topics  . Alcohol use: No    Comment: Occasional wine in the past  . Drug use: No     Allergies   Influenza vaccines; Paliperidone; Aspirin; Penicillins; and Sulfa antibiotics   Review of Systems Review of Systems  All other systems reviewed and are negative.    Physical Exam Updated Vital Signs BP (!) 151/82 (BP Location: Left Arm)   Pulse 67   Temp 98.5 F (36.9 C) (Oral)   Resp 20   Ht 5\' 4"  (1.626 m)   Wt 59.9 kg (132 lb)   SpO2 98%   BMI 22.66 kg/m   Physical Exam  Nursing note and vitals reviewed.  82 year old female, resting comfortably and in no acute distress. Vital signs are significant for elevated systolic blood pressure. Oxygen saturation is 98%, which is normal. Head is normocephalic and atraumatic. PERRLA, EOMI. Oropharynx is clear. Neck is nontender and supple without adenopathy or JVD. Back is nontender and there is no CVA  tenderness. Lungs are clear without rales, wheezes, or rhonchi. Chest is nontender. Heart has regular rate and rhythm with 2/6 systolic ejection murmur heard at the upper left sternal border. Abdomen is soft, flat, nontender without masses or hepatosplenomegaly and peristalsis is normoactive. Extremities have no cyanosis or edema, full range of motion is present. Skin is warm and dry without rash. Neurologic: Mental status is normal, cranial nerves are intact, there are no motor or sensory deficits.  ED Treatments / Results  Labs (all labs ordered are listed, but only abnormal results are displayed) Labs Reviewed  BASIC METABOLIC PANEL - Abnormal; Notable for the following components:      Result Value   Potassium 3.4 (*)    Creatinine, Ser 1.16 (*)    GFR calc non Af Amer 44 (*)    GFR calc Af Amer 51 (*)    All other components within normal limits  CBC - Abnormal; Notable for the following components:   Hemoglobin 11.0 (*)    HCT 35.3 (*)    All other components within normal limits  DIFFERENTIAL  TROPONIN I  TROPONIN I  TROPONIN I  VITAMIN B12  FOLATE  IRON AND TIBC  FERRITIN  RETICULOCYTES  I-STAT TROPONIN, ED    EKG EKG Interpretation  Date/Time:  Friday November 29 2017 03:27:32 EDT Ventricular Rate:  56 PR Interval:    QRS Duration: 97 QT Interval:  447 QTC Calculation: 432 R Axis:   87 Text Interpretation:  Sinus rhythm Borderline right axis deviation LVH with secondary repolarization abnormality Artifact in lead(s) I II aVR aVL When compared with ECG of 12/24/2017, No significant change was found Confirmed by Delora Fuel ([id number]) on 11/29/2017 3:31:41 AM   Radiology Dg Chest 2 View  Result Date: 11/29/2017 CLINICAL DATA:  Chest pain EXAM: CHEST - 2 VIEW COMPARISON:  11/24/2017 FINDINGS: The heart size and mediastinal contours are within normal limits. Both lungs are clear. The visualized skeletal structures are unremarkable. IMPRESSION: No active cardiopulmonary  disease. Electronically Signed   By: Ulyses Jarred M.D.   On: 11/29/2017 04:23    Procedures Procedures  Medications Ordered in ED Medications  nitroGLYCERIN (NITROSTAT) SL tablet 0.4 mg (0.4 mg Sublingual Given 11/29/17 0658)  morphine 4 MG/ML injection 1 mg (2 mg Intravenous Given 11/29/17 0642)  amLODipine (NORVASC) tablet 10 mg (has no administration in time range)  ARIPiprazole (ABILIFY) tablet 15 mg (has no administration in time range)  cholecalciferol (VITAMIN D) tablet 1,000 Units (has no administration in time range)  docusate sodium (COLACE) capsule 100 mg (has no administration in time range)  ferrous sulfate tablet 325 mg (has no administration in time range)  furosemide (LASIX) tablet 20 mg (has no administration in time range)  fluticasone (FLONASE) 50 MCG/ACT nasal spray 2 spray (has no administration in time range)  hydrALAZINE (APRESOLINE) tablet 10 mg (has no administration in time range)  loratadine (CLARITIN) tablet 10 mg (has no administration in time range)  losartan (COZAAR) tablet 100 mg (has no administration in time range)  oxybutynin (DITROPAN) tablet 2.5 mg (has no administration in time range)  pantoprazole (PROTONIX) EC tablet 40 mg (has no administration in time range)  potassium chloride SA (K-DUR,KLOR-CON) CR tablet 20 mEq (has no administration in time range)  venlafaxine XR (EFFEXOR-XR) 24 hr capsule 150 mg (has no administration in time range)  albuterol (PROVENTIL) (2.5 MG/3ML) 0.083% nebulizer solution 3 mL (has no administration in time range)  vitamin C (ASCORBIC ACID) tablet 1,000 mg (has no administration in time range)  vitamin E capsule 1,000 Units (has no administration in time range)  ondansetron (ZOFRAN) injection 4 mg (has no administration in time range)  heparin injection 5,000 Units (has no administration in time range)  gi cocktail (Maalox,Lidocaine,Donnatal) (has no administration in time range)  hydrALAZINE (APRESOLINE) injection 5 mg  (has no administration in time range)  traMADol-acetaminophen (ULTRACET) 37.5-325 MG per tablet 1 tablet (has no administration in time range)     Initial Impression / Assessment and Plan / ED Course  I have reviewed the triage vital signs  and the nursing notes.  Pertinent labs & imaging results that were available during my care of the patient were reviewed by me and considered in my medical decision making (see chart for details).  Chest pain of uncertain cause.  Patient is not a reliable historian, which makes evaluation difficult.  ECG is unchanged from prior.  Old records are reviewed, and she did have a hospitalization for chest pain last November, pain was felt to be noncardiac.  It is also noted that she was in the ED it was a [facility] yesterday for what she states was muscle spasm.  Chest x-ray is unremarkable.  Labs showed mild hypokalemia which is felt to be related to diuretic use.  She has been given additional nitroglycerin without any relief.  I am concerned about her pain since her history is not reliable.  Case is discussed with Dr. Blaine Hamper of [facility], who agrees to admit the patient.  Final Clinical Impressions(s) / ED Diagnoses   Final diagnoses:  Nonspecific chest pain  Diuretic-induced hypokalemia    ED Discharge Orders    None       Delora Fuel, MD 09/98/33 [id number]

## 2017-11-29 NOTE — ED Notes (Signed)
Spoke with admitting provider and due to active chest pain the patient needs to be a stepdown pt.

## 2017-11-29 NOTE — H&P (Addendum)
History and Physical    [name] FBP:[id number] DOB: 1935/08/14 DOA: 11/29/2017   PCP: Margreta Journey   Patient coming from: Ker[facility]    Chief Complaint: Chest pain   HPI: Kerri Hernandez is a 82 y.o. female with medical history significant for remote TIA, GERD, diastolic CHF,history of myasthenia gravis (remote),  hypertension, hyperlipidemia, depression, iron deficiency anemia and anemia of chronic disease, CKD stage III, mild dementia, depression, with left-sided chest pain, starting yesterday afternoon, radiating to her arm.  The pain is dull, constant and at times accompanied by some dyspnea, nausea, and diaphoresis.  He rates it 7 out of 10, but is unable to tell if aspirin and nitroglycerin in helped her.  She denies any headaches or vision changes.  She denies any recent heavy lifting.  She denies any cough.  She denies any abdominal pain.  She has intermittent diarrhea, after using laxatives.  The patient denies any lower extremity swelling, or calf pain.  No worsening confusion is noted.Of note, the patient was seen at the ED 2 days prior, with BP over 200, and was treated with anti-hypertensions, sent back to the nursing home.  Denies tobacco, alcohol or recreational drug use  .  ED Course:  BP (!) 168/79   Pulse (!) 57   Temp 98.5 F (36.9 C) (Oral)   Resp (!) 0   Ht 5\' 4"  (1.626 m)   Wt 59.9 kg (132 lb)   SpO2 100%   BMI 22.66 kg/m   EKG showed T wave inversion in V4-V5.  However, this changes were noted in prior EKG. Potassium 3.4.  The patient has not taken her potassium replenishment this morning Sodium 140, potassium 3.4, BiCarb 29, BUN 17, creatinine 1.16 Troponin negative x2 White count 6.7, hemoglobin 11, platelets 258 Sed rate is 8 Glu 92  CXR NAD   Review of Systems:  As per HPI otherwise all other systems reviewed and are negative  Past Medical History:  Diagnosis Date  . Arthritis   . Asthma   . Cancer ([place])   . CHF  (congestive heart failure) ([place])   . Hypertension   . TIA (transient ischemic attack)     Past Surgical History:  Procedure Laterality Date  . ABDOMINAL HYSTERECTOMY    . DIRECT LARYNGOSCOPY N/A 08/09/2017   Procedure: DIRECT  laryngoscopy and rigid esophagoscopy;  Surgeon: Melida Quitter, MD;  Location: [facility];  Service: ENT;  Laterality: N/A;  . MASTECTOMY      Social History Social History   Socioeconomic History  . Marital status: Divorced    Spouse name: Not on file  . Number of children: Not on file  . Years of education: Not on file  . Highest education level: Not on file  Occupational History  . Not on file  Social Needs  . Financial resource strain: Not on file  . Food insecurity:    Worry: Not on file    Inability: Not on file  . Transportation needs:    Medical: Not on file    Non-medical: Not on file  Tobacco Use  . Smoking status: Former Research scientist (life sciences)  . Smokeless tobacco: Never Used  . Tobacco comment: As a teenager.  Substance and Sexual Activity  . Alcohol use: No    Comment: Occasional wine in the past  . Drug use: No  . Sexual activity: Not on file  Lifestyle  . Physical activity:    Days per week: Not on file  Minutes per session: Not on file  . Stress: Not on file  Relationships  . Social connections:    Talks on phone: Not on file    Gets together: Not on file    Attends religious service: Not on file    Active member of club or organization: Not on file    Attends meetings of clubs or organizations: Not on file    Relationship status: Not on file  . Intimate partner violence:    Fear of current or ex partner: Not on file    Emotionally abused: Not on file    Physically abused: Not on file    Forced sexual activity: Not on file  Other Topics Concern  . Not on file  Social History Narrative  . Not on file     Allergies  Allergen Reactions  . Influenza Vaccines Other (See Comments)    Arm Swelling  . Paliperidone   . Aspirin Rash     Reaction to regular aspirin - no reaction to low dose aspirin  . Penicillins Rash    Has patient had a PCN reaction causing immediate rash, facial/tongue/throat swelling, SOB or lightheadedness with hypotension: Yes Has patient had a PCN reaction causing severe rash involving mucus membranes or skin necrosis: No Has patient had a PCN reaction that required hospitalization: Yes Has patient had a PCN reaction occurring within the last 10 years: No If all of the above answers are "NO", then may proceed with Cephalosporin use.  . Sulfa Antibiotics Rash    Family History  Problem Relation Age of Onset  . Heart attack Father        Prior to Admission medications   Medication Sig Start Date End Date Taking? Authorizing Provider  acetaminophen (TYLENOL) 500 MG tablet Take 2 tablets (1,000 mg total) by mouth every 6 (six) hours as needed. Patient taking differently: Take 1,000 mg by mouth every 6 (six) hours as needed for mild pain, moderate pain, fever or headache.  07/31/17  Yes Charlesetta Shanks, MD  amLODipine (NORVASC) 10 MG tablet Take 1 tablet (10 mg total) daily by mouth. 05/01/17  Yes Burgess Estelle, MD  ARIPiprazole (ABILIFY) 15 MG tablet Take 15 mg by mouth daily. 11/04/17  Yes [provider]  cholecalciferol (VITAMIN D) 1000 units tablet Take 1,000 Units by mouth daily.   Yes [provider]  docusate sodium (COLACE) 100 MG capsule Take 1 capsule (100 mg total) by mouth every 12 (twelve) hours. 09/28/17  Yes Tanna Furry, MD  ferrous sulfate 325 (65 FE) MG tablet Take 325 mg by mouth daily.   Yes [provider]  fluticasone (FLONASE) 50 MCG/ACT nasal spray Place 2 sprays into both nostrils daily.    Yes [provider]  furosemide (LASIX) 20 MG tablet Take 20 mg daily by mouth.   Yes [provider]  hydrALAZINE (APRESOLINE) 10 MG tablet Take 1 tablet (10 mg total) by mouth 3 (three) times daily. To use as needed if patient's blood pressure  remains greater than 150/80 when taking regular medications as prescribed. Patient taking differently: Take 10 mg by mouth daily.  07/31/17  Yes Charlesetta Shanks, MD  loratadine (CLARITIN) 10 MG tablet Take 10 mg by mouth daily.   Yes [provider]  losartan (COZAAR) 100 MG tablet Take 100 mg by mouth daily.   Yes [provider]  nitroGLYCERIN (NITROSTAT) 0.4 MG SL tablet Place 0.4 mg under the tongue every 5 (five) minutes as needed for chest  pain. For up to 3 consecutive doses *Do not crush*   Yes [provider]  oxybutynin (DITROPAN) 5 MG tablet Take 2.5 mg by mouth 2 (two) times daily.   Yes [provider]  pantoprazole (PROTONIX) 40 MG tablet Take 40 mg by mouth daily. Do not crush   Yes [provider]  potassium chloride SA (K-DUR,KLOR-CON) 20 MEQ tablet Take 1 tablet (20 mEq total) by mouth 2 (two) times daily. 09/23/52  Yes Delora Fuel, MD  traMADol-acetaminophen (ULTRACET) 37.5-325 MG tablet Take 1 tablet by mouth every 6 (six) hours as needed. Patient taking differently: Take 1 tablet by mouth every 6 (six) hours as needed for moderate pain or severe pain.  09/15/17  Yes Daleen Bo, MD  venlafaxine XR (EFFEXOR-XR) 150 MG 24 hr capsule Take 150 mg by mouth daily with breakfast.   Yes [provider]  VENTOLIN HFA 108 (90 Base) MCG/ACT inhaler Take 2 puffs by mouth every 4 (four) hours as needed for wheezing.  11/15/17  Yes [provider]  vitamin C (ASCORBIC ACID) 500 MG tablet Take 1,000 mg by mouth daily.   Yes [provider]  vitamin E 1000 UNIT capsule Take 1,000 Units by mouth daily.   Yes [provider]  phenazopyridine (PYRIDIUM) 200 MG tablet Take 1 tablet (200 mg total) by mouth 3 (three) times daily. Patient not taking: Reported on 12/12/348 0/9/38   Delora Fuel, MD     Physical Exam:  Vitals:   11/29/17 0545 11/29/17 0600 11/29/17 0615 11/29/17 0630  BP: (!) 153/76 (!) 168/81 (!) 164/79  (!) 168/79  Pulse: (!) 56 61 (!) 50 (!) 57  Resp: 16 14 (!) 7 (!) 0  Temp:      TempSrc:      SpO2: 100% 100% 100% 100%  Weight:      Height:       Constitutional: NAD, calm, comfortable  Eyes: PERRL, lids and conjunctivae normal ENMT: Mucous membranes are moist, without exudate or lesions. Several missing teeth Neck: normal, supple, no masses, no thyromegaly Respiratory: clear to auscultation bilaterally, no wheezing, no crackles. Normal respiratory effort  Cardiovascular: Regular rate and rhythm, 2/6  murmur, rubs or gallops. No extremity edema. 2+ pedal pulses. No carotid bruits.  Abdomen: Soft, non tender, No hepatosplenomegaly. Bowel sounds positive.  Musculoskeletal: no clubbing / cyanosis. Moves all extremities. L tenderness at the chest wall area  Skin: no jaundice, No lesions.  Neurologic: Sensation intact  Strength equal in all extremities Psychiatric:   Alert and oriented x 3. Normal mood.     Labs on Admission: I have personally reviewed following labs and imaging studies  CBC: Recent Labs  Lab 11/24/17 0444 11/28/17 1058 11/29/17 0334  WBC 6.0 7.9 6.7  NEUTROABS  --  4.6 3.3  HGB 10.5* 12.2 11.0*  HCT 33.7* 38.3 35.3*  MCV 89.4 89.7 88.0  PLT 230 241 182    Basic Metabolic Panel: Recent Labs  Lab 11/24/17 0444 11/28/17 1058 11/29/17 0334  NA 142 143 140  K 3.2* 3.3* 3.4*  CL 105 104 102  CO2 30 29 29   GLUCOSE 90 83 92  BUN 13 15 17   CREATININE 1.02* 0.94 1.16*  CALCIUM 8.9 9.4 9.4    GFR: Estimated Creatinine Clearance: 34 mL/min (A) (by C-G formula based on SCr of 1.16 mg/dL (H)).  Liver Function Tests: Recent Labs  Lab 11/24/17 0444  AST 19  ALT 11*  ALKPHOS 64  BILITOT 0.7  PROT  6.6  ALBUMIN 3.4*   Recent Labs  Lab 11/24/17 0444  LIPASE 28   No results for input(s): AMMONIA in the last 168 hours.  Coagulation Profile: No results for input(s): INR, PROTIME in the last 168 hours.  Cardiac Enzymes: No results for input(s):  CKTOTAL, CKMB, CKMBINDEX, TROPONINI in the last 168 hours.  BNP (last 3 results) No results for input(s): PROBNP in the last 8760 hours.  HbA1C: No results for input(s): HGBA1C in the last 72 hours.  CBG: No results for input(s): GLUCAP in the last 168 hours.  Lipid Profile: No results for input(s): CHOL, HDL, LDLCALC, TRIG, CHOLHDL, LDLDIRECT in the last 72 hours.  Thyroid Function Tests: No results for input(s): TSH, T4TOTAL, FREET4, T3FREE, THYROIDAB in the last 72 hours.  Anemia Panel: No results for input(s): VITAMINB12, FOLATE, FERRITIN, TIBC, IRON, RETICCTPCT in the last 72 hours.  Urine analysis:    Component Value Date/Time   COLORURINE YELLOW 11/24/2017 [place] 11/24/2017 0445   LABSPEC 1.013 11/24/2017 0445   PHURINE 6.0 11/24/2017 0445   GLUCOSEU NEGATIVE 11/24/2017 0445   HGBUR NEGATIVE 11/24/2017 0445   BILIRUBINUR NEGATIVE 11/24/2017 0445   KETONESUR NEGATIVE 11/24/2017 0445   PROTEINUR NEGATIVE 11/24/2017 0445   UROBILINOGEN 0.2 09/26/2010 0544   NITRITE NEGATIVE 11/24/2017 0445   LEUKOCYTESUR NEGATIVE 11/24/2017 0445    Sepsis Labs: @LABRCNTIP (procalcitonin:4,lacticidven:4) )No results found for this or any previous visit (from the past 240 hour(s)).   Radiological Exams on Admission: Dg Chest 2 View  Result Date: 11/29/2017 CLINICAL DATA:  Chest pain EXAM: CHEST - 2 VIEW COMPARISON:  11/24/2017 FINDINGS: The heart size and mediastinal contours are within normal limits. Both lungs are clear. The visualized skeletal structures are unremarkable. IMPRESSION: No active cardiopulmonary disease. Electronically Signed   By: Ulyses Jarred M.D.   On: 11/29/2017 04:23    EKG: Independently reviewed. Sinus rhythm Borderline right axis deviation LVH with secondary repolarization abnormality Artifact in lead(s) I II aVR aVL When compared with ECG of 11/24/2017, No significant change was found ( 6/9 Sinus rhythm Atrial premature complex LVH with  secondary repolarization abnormality)  Echo 04/2017 The cavity size was normal. There was severe  focal basal and moderate concentric hypertrophy. Systolic   function was normal. The estimated ejection fraction was in the   range of 60% to 65%. Wall motion was normal; there were no  regional wall motion abnormalities. There was an increased  relative contribution of atrial contraction to ventricular  filling. Doppler parameters are consistent with abnormal left  ventricular relaxation (grade 1 diastolic dysfunction).  Assessment/Plan Principal Problem:   Chest pain Active Problems:   Hypertension   Aortic valve regurgitation   Anemia in stage 3 chronic kidney disease (HCC)   Chronic diastolic heart failure (HCC)   Dementia without behavioral disturbance   Environmental and seasonal allergies   Left ventricular hypertrophy   Osteoarthritis of multiple joints   Urinary incontinence   Chest pain syndrome, cardiac versus musculoskeletal versus GI  HEART score 4  . Troponin neg  EKG Sinus rhythm Borderline right axis deviation LVH with secondary repolarization abnormality Artifact in lead(s) I II aVR aVL When compared with ECG of 11/24/2017,CP unrelieved by nitroglycerin aspirin. CXR unrevealing. Last 2 D echo 11/21018 with EF 60-65, Gr 1 DD and normal systolic. Of note, patient was seen at hospital in Nov 2018 with similar symptoms, felt to be of non cardiac nature, and on 6/9 with what it appeared to be  muscle spasm   Admit to Telemetry SDU in view of unresolved CP  Chest pain order set Cycle troponins EKG in am continue ASA, O2 and NTG as needed GI cocktail Check Lipid panel  Patient may need Lexiscan test Pain control prn  Hypertension BP  168/79   Pulse  57   Continue home anti-hypertensive medications  Add Hydralazine Q6 hours as needed for BP >518  Diastolic CHF: No acute decompensation weight  . Last 2 D echo 11/21018 with EF 60-65, Gr 1 DD and normal systolic.  Continue meds,  holding losartan due to slight elevation of her renal functions , may resume in am  Obtain daily weights Monitor intake and output  Asthma without exacerbation Osats normal  CXR NAD   WBC normal  Continue nebs and O2 prn   Anemia of chronic disease Hemoglobin on admission 11 at baseline, no acute bleeding issues  Repeat CBC in am  No transfusion is indicated at this time Continue Iron supplements Anemia panel in view of chest pain   Chronic kidney disease stage  3 Current Cr is 1.16  Lab Results  Component Value Date   CREATININE 1.16 (H) 11/29/2017   CREATININE 0.94 11/28/2017   CREATININE 1.02 (H) 11/24/2017  Hold  ACEI Repeat BMET in am      GERD, no acute symptoms Continue PPI  Urine incontinence Continue Ditropan  History of Dementia, depression  Continue Effexor, Abilify  DVT prophylaxis:  Heparin  Code Status:    Full Code Family Communication:  Discussed with patient Disposition Plan: Expect patient to be discharged to home after condition improves Consults called:    None  Admission status:  Tele SDU    Sharene Butters, PA-C [facility]   Amion text  [phone]   11/29/2017, 6:40 AM

## 2017-11-29 NOTE — ED Triage Notes (Addendum)
[facility] advised the pt is from [facility] and is coming in for chest pain that started at 1300 on 11/28/17. EMS reports the pt was just seen at [facility] earlier today for muscle spasms and discharged. EMS reports pt was initially hypertensive but normal tensive upon arrival. EMS reports 324mg  of ASA and 2 Nitro. EMS did not start an IV. EMS reports 12 lead unremarkable.   Pt states she is having left sided chest pain that radiates to her arm. Pt also reports nausea. Pt rates her pain a 7 out of 10.
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
## 2017-11-29 NOTE — ED Notes (Signed)
Administered 2mg  of Morphine. Original order was for 2mg  and it was changed after medication was administered.

## 2017-11-29 NOTE — Care Management (Signed)
This is a no charge note  Pending admission per Dr. Roxanne Mins  82 year old lady with past medical history of hypertension, asthma, TIA, GERD, CHF, depression, anemia, mild dementia, who presents with chest pain. Her chest started yesterday afternoon. Pt states she is having left sided chest pain that radiates to her arm. EMS reports pt was initially hypertensive, but normotensive upon arrival.  EKG showed T wave inversion in V4-V5.  Negative troponin.  Negative chest x-ray.  Patient is placed on telemetry bed of observation  Ivor Costa, MD  [facility] Pager [phone]  If 7PM-7AM, please contact night-coverage [email or web address] Password [facility] 11/29/2017, 5:57 AM

## 2017-11-29 NOTE — Consult Note (Addendum)
Cardiology Consultation:   Patient ID: Kerri Hernandez; [id number]; 18-Sep-1935   Admit date: 11/29/2017 Date of Consult: 11/29/2017  Primary Care Provider: Margreta Journey Primary Cardiologist: Pixie Casino, MD  Primary Electrophysiologist:  NA   Patient Profile:   Kerri Hernandez is a 82 y.o. female with a hx of Asthma, CHF, HTN, mild dementia and TIA who is being seen today for the evaluation of chest pain at the request of Dr Evangeline Gula.  History of Present Illness:   Kerri Hernandez has a hx of Asthma, CHF, HTN, mild dementia and TIA.   She was seen last year by Dr. Debara Pickett in hospital setting for chest pain and pt related being told she had CAD in past.  No Cardiac cath hx. She was noted to have a murmur and echo done with EF 60%, aortic valve sclerosis and mild aortic insuff.  No RWMA.  No further cardiac work up was done.  She also noted to have bradycardia and BB was not recommended.    Now admitted with lt sided chest pain, that began yesterday and radiated to her arm.  Pain was Lt ant chest burning pain with pressure.  + SOB and vomiting.  NTG by EMS did help some.  No hx of chest pain since last year this was when she woke early AM.  She did feel her heart was racing today as well.       She has had multiple ER visits on 6/9 abd and chest pain, other visits from knee pain to UTI.  & visits since April.    EKG I personally reviewed, SB at 76 with LVH similar to EKG 11/24/17 though HR was 50 Tele I personally reviewed SB + PACs.  Troponin  0.00; 0.01; <0.03   Na 140, K+ 3.4 and Cr 1.16  Hgb 11 CXR no acute process.  Currently with some mild chest pressure no increase with palpation.  No nausea or SOB currently.  HR on tele 48 to upper 50s SB mostly in mid 81s.  No meds to cause -she is on amlodipine.  Past Medical History:  Diagnosis Date  . Arthritis   . Asthma   . Cancer ([place])   . CHF (congestive heart failure) ([place])   . Hypertension   . TIA (transient ischemic attack)       Past Surgical History:  Procedure Laterality Date  . ABDOMINAL HYSTERECTOMY    . DIRECT LARYNGOSCOPY N/A 08/09/2017   Procedure: DIRECT  laryngoscopy and rigid esophagoscopy;  Surgeon: Melida Quitter, MD;  Location: [facility];  Service: ENT;  Laterality: N/A;  . MASTECTOMY       Home Medications:  Prior to Admission medications   Medication Sig Start Date End Date Taking? Authorizing Provider  acetaminophen (TYLENOL) 500 MG tablet Take 2 tablets (1,000 mg total) by mouth every 6 (six) hours as needed. Patient taking differently: Take 1,000 mg by mouth every 6 (six) hours as needed for mild pain, moderate pain, fever or headache.  07/31/17  Yes Charlesetta Shanks, MD  amLODipine (NORVASC) 10 MG tablet Take 1 tablet (10 mg total) daily by mouth. 05/01/17  Yes Burgess Estelle, MD  ARIPiprazole (ABILIFY) 15 MG tablet Take 15 mg by mouth daily. 11/04/17  Yes [provider]  cholecalciferol (VITAMIN D) 1000 units tablet Take 1,000 Units by mouth daily.   Yes [provider]  docusate sodium (COLACE) 100 MG capsule Take 1 capsule (100 mg total) by mouth every 12 (twelve) hours. 09/28/17  Yes  Tanna Furry, MD  ferrous sulfate 325 (65 FE) MG tablet Take 325 mg by mouth daily.   Yes [provider]  fluticasone (FLONASE) 50 MCG/ACT nasal spray Place 2 sprays into both nostrils daily.    Yes [provider]  furosemide (LASIX) 20 MG tablet Take 20 mg daily by mouth.   Yes [provider]  hydrALAZINE (APRESOLINE) 10 MG tablet Take 1 tablet (10 mg total) by mouth 3 (three) times daily. To use as needed if patient's blood pressure remains greater than 150/80 when taking regular medications as prescribed. Patient taking differently: Take 10 mg by mouth daily.  07/31/17  Yes Charlesetta Shanks, MD  loratadine (CLARITIN) 10 MG tablet Take 10 mg by mouth daily.   Yes [provider]  losartan (COZAAR) 100 MG tablet Take 100 mg by mouth daily.   Yes [provider]  nitroGLYCERIN (NITROSTAT) 0.4 MG SL tablet Place 0.4 mg under the tongue every 5 (five) minutes as needed for chest pain. For up to 3 consecutive doses *Do not crush*   Yes [provider]  oxybutynin (DITROPAN) 5 MG tablet Take 2.5 mg by mouth 2 (two) times daily.   Yes [provider]  pantoprazole (PROTONIX) 40 MG tablet Take 40 mg by mouth daily. Do not crush   Yes [provider]  potassium chloride SA (K-DUR,KLOR-CON) 20 MEQ tablet Take 1 tablet (20 mEq total) by mouth 2 (two) times daily. 11/17/20  Yes Delora Fuel, MD  traMADol-acetaminophen (ULTRACET) 37.5-325 MG tablet Take 1 tablet by mouth every 6 (six) hours as needed. Patient taking differently: Take 1 tablet by mouth every 6 (six) hours as needed for moderate pain or severe pain.  09/15/17  Yes Daleen Bo, MD  venlafaxine XR (EFFEXOR-XR) 150 MG 24 hr capsule Take 150 mg by mouth daily with breakfast.   Yes [provider]  VENTOLIN HFA 108 (90 Base) MCG/ACT inhaler Take 2 puffs by mouth every 4 (four) hours as needed for wheezing.  11/15/17  Yes [provider]  vitamin C (ASCORBIC ACID) 500 MG tablet Take 1,000 mg by mouth daily.   Yes [provider]  vitamin E 1000 UNIT capsule Take 1,000 Units by mouth daily.   Yes [provider]  phenazopyridine (PYRIDIUM) 200 MG tablet Take 1 tablet (200 mg total) by mouth 3 (three) times daily. Patient not taking: Reported on 9/79/8921 06/27/39   Delora Fuel, MD    Inpatient Medications: Scheduled Meds: . amLODipine  10 mg Oral Daily  . ARIPiprazole  15 mg Oral Daily  . cholecalciferol  1,000 Units Oral Daily  . docusate sodium  100 mg Oral Q12H  . ferrous sulfate  325 mg Oral Daily  . fluticasone  2 spray Each Nare Daily  . furosemide  20 mg Oral Daily  . heparin  5,000 Units Subcutaneous Q8H  . hydrALAZINE  10 mg Oral TID  . loratadine  10 mg Oral Daily  . [START ON 11/30/2017] losartan  100 mg Oral Daily    . oxybutynin  2.5 mg Oral BID  . pantoprazole  40 mg Oral Daily  . potassium chloride SA  20 mEq Oral BID  . venlafaxine XR  150 mg Oral Q breakfast  . vitamin C  1,000 mg Oral Daily  . vitamin E  1,000 Units Oral Daily   Continuous Infusions:  PRN Meds: albuterol, gi cocktail, hydrALAZINE, morphine injection, nitroGLYCERIN, ondansetron (ZOFRAN) IV, traMADol-acetaminophen  Allergies:    Allergies  Allergen Reactions  . Influenza Vaccines Other (See Comments)    Arm Swelling  . Paliperidone   . Aspirin Rash    Reaction to regular aspirin - no reaction to low dose aspirin  . Penicillins Rash    Has patient had a PCN reaction causing immediate rash, facial/tongue/throat swelling, SOB or lightheadedness with hypotension: Yes Has patient had a PCN reaction causing severe rash involving mucus membranes or skin necrosis: No Has patient had a PCN reaction that required hospitalization: Yes Has patient had a PCN reaction occurring within the last 10 years: No If all of the above answers are "NO", then may proceed with Cephalosporin use.  . Sulfa Antibiotics Rash    Social History:   Social History   Socioeconomic History  . Marital status: Divorced    Spouse name: Not on file  . Number of children: Not on file  . Years of education: Not on file  . Highest education level: Not on file  Occupational History  . Not on file  Social Needs  . Financial resource strain: Not on file  . Food insecurity:    Worry: Not on file    Inability: Not on file  . Transportation needs:    Medical: Not on file    Non-medical: Not on file  Tobacco Use  . Smoking status: Former Research scientist (life sciences)  . Smokeless tobacco: Never Used  . Tobacco comment: As a teenager.  Substance and Sexual Activity  . Alcohol use: No    Comment: Occasional wine in the past  . Drug use: No  . Sexual activity: Not on file  Lifestyle  . Physical activity:    Days per week: Not on file    Minutes per session: Not on file   . Stress: Not on file  Relationships  . Social connections:    Talks on phone: Not on file    Gets together: Not on file    Attends religious service: Not on file    Active member of club or organization: Not on file    Attends meetings of clubs or organizations: Not on file    Relationship status: Not on file  . Intimate partner violence:    Fear of current or ex partner: Not on file    Emotionally abused: Not on file    Physically abused: Not on file    Forced sexual activity: Not on file  Other Topics Concern  . Not on file  Social History Narrative  . Not on file    Family History:    Family History  Problem Relation Age of Onset  . Heart attack Father      ROS:  Please see the history of present illness.  General:no colds or fevers, no weight changes Skin:no rashes or ulcers HEENT:no blurred vision, no congestion CV:see HPI PUL:see HPI GI:no diarrhea constipation or melena, no indigestion GU:no hematuria, no dysuria MS:no joint pain, no claudication Neuro:no syncope, no lightheadedness Endo:no diabetes, no thyroid disease  All other ROS reviewed and negative.     Physical Exam/Data:   Vitals:   11/29/17 0645 11/29/17 0700 11/29/17 0715 11/29/17 0909  BP: 124/69 121/67 111/71 (!) 167/78  Pulse: 70 62 62 (!) 50  Resp: 12 14 20 17   Temp:      TempSrc:      SpO2: 98% 97% 97% 100%  Weight:    129 lb 10.1 oz (58.8 kg)  Height:    5\' 6"  (1.676 m)   No intake  or output data in the 24 hours ending 11/29/17 0944 Filed Weights   11/29/17 0332 11/29/17 0909  Weight: 132 lb (59.9 kg) 129 lb 10.1 oz (58.8 kg)   Body mass index is 20.92 kg/m.  General:  Frail female, in no acute distress HEENT: normal Lymph: no adenopathy Neck: no JVD Endocrine:  No thryomegaly Vascular: No carotid bruits; pedal pulses 1+ bilaterally   Cardiac:  normal S1, S2; RRR; 7-9/0 systolic murmur no gallup or rub. Lungs:  clear to auscultation bilaterally, no wheezing, rhonchi or  rales  Abd: soft, nontender, no hepatomegaly  Ext: no edema Musculoskeletal:  No deformities, BUE and BLE strength normal and equal Skin: warm and dry  Neuro:  Alert and oriented X 3 MAE, follows commands, no focal abnormalities noted Psych:  Normal affect     Relevant CV Studies: Echo 04/29/18 Study Conclusions  - Left ventricle: The cavity size was normal. There was severe   focal basal and moderate concentric hypertrophy. Systolic   function was normal. The estimated ejection fraction was in the   range of 60% to 65%. Wall motion was normal; there were no   regional wall motion abnormalities. There was an increased   relative contribution of atrial contraction to ventricular   filling. Doppler parameters are consistent with abnormal left   ventricular relaxation (grade 1 diastolic dysfunction). - Aortic valve: There was mild regurgitation. Valve area (VTI):   2.26 cm^2. Valve area (Vmax): 2.03 cm^2. Valve area (Vmean): 2.31   cm^2. - Right ventricle: The cavity size was mildly dilated. Wall   thickness was normal. - Pulmonary arteries: PA peak pressure: 45 mm Hg (S). - Pericardium, extracardiac: A trivial, free-flowing pericardial   effusion was identified posterior to the heart. The fluid had no   internal echoes.  Impressions:  - The right ventricular systolic pressure was increased consistent   with moderate pulmonary hypertension.   Laboratory Data:  Chemistry Recent Labs  Lab 11/24/17 0444 11/28/17 1058 11/29/17 0334  NA 142 143 140  K 3.2* 3.3* 3.4*  CL 105 104 102  CO2 30 29 29   GLUCOSE 90 83 92  BUN 13 15 17   CREATININE 1.02* 0.94 1.16*  CALCIUM 8.9 9.4 9.4  GFRNONAA 51* 56* 44*  GFRAA 59* >60 51*  ANIONGAP 7 10 9     Recent Labs  Lab 11/24/17 0444  PROT 6.6  ALBUMIN 3.4*  AST 19  ALT 11*  ALKPHOS 64  BILITOT 0.7   Hematology Recent Labs  Lab 11/24/17 0444 11/28/17 1058 11/29/17 0334 11/29/17 0846  WBC 6.0 7.9 6.7  --   RBC  3.77* 4.27 4.01 3.88  HGB 10.5* 12.2 11.0*  --   HCT 33.7* 38.3 35.3*  --   MCV 89.4 89.7 88.0  --   MCH 27.9 28.6 27.4  --   MCHC 31.2 31.9 31.2  --   RDW 15.4 15.7* 15.5  --   PLT 230 241 258  --    Cardiac Enzymes Recent Labs  Lab 11/29/17 0641  TROPONINI <0.03    Recent Labs  Lab 11/24/17 0453 11/29/17 0353  TROPIPOC 0.00 0.01    BNPNo results for input(s): BNP, PROBNP in the last 168 hours.  DDimer No results for input(s): DDIMER in the last 168 hours.  Radiology/Studies:  Dg Chest 2 View  Result Date: 11/29/2017 CLINICAL DATA:  Chest pain EXAM: CHEST - 2 VIEW COMPARISON:  11/24/2017 FINDINGS: The heart size and mediastinal contours are within normal limits. Both  lungs are clear. The visualized skeletal structures are unremarkable. IMPRESSION: No active cardiopulmonary disease. Electronically Signed   By: Ulyses Jarred M.D.   On: 11/29/2017 04:23    Assessment and Plan:   1. Chest pain, EKG with LVH and bradycardic, hx of being told she has CAD.  Would do cardiac CTA vs. Cath.  troponins neg. Dr. Radford Pax to evaluate. 2.  HTN --had been elevated on the 9th of this month with ER visit at 220 to 173.  Currently 167/78 3. Sinus Bradycardia to 48, no dizziness ? Decrease or stop amlodipine.  Will defer to Dr. Radford Pax. 4. LVH on EKG last echo with severe focal basal and moderate concentric hypertrophy.   5. Mild AR  6. Pulmonary HTN with PA pk pressure 45 mmHg.     For questions or updates, please contact [employer] Please consult [email or web address] for contact info under Cardiology/STEMI.   Signed, Cecilie Kicks, NP  11/29/2017 9:44 AM

## 2017-11-30 DIAGNOSIS — I5032 Chronic diastolic (congestive) heart failure: Secondary | ICD-10-CM

## 2017-11-30 DIAGNOSIS — N183 Chronic kidney disease, stage 3 (moderate): Secondary | ICD-10-CM

## 2017-11-30 DIAGNOSIS — F0390 Unspecified dementia without behavioral disturbance: Secondary | ICD-10-CM

## 2017-11-30 DIAGNOSIS — D631 Anemia in chronic kidney disease: Secondary | ICD-10-CM | POA: Diagnosis not present

## 2017-11-30 DIAGNOSIS — I351 Nonrheumatic aortic (valve) insufficiency: Secondary | ICD-10-CM

## 2017-11-30 DIAGNOSIS — R079 Chest pain, unspecified: Principal | ICD-10-CM

## 2017-11-30 LAB — LIPID PANEL
CHOLESTEROL: 206 mg/dL — AB (ref 0–200)
HDL: 98 mg/dL (ref >40)
LDL Cholesterol: 103 mg/dL — ABNORMAL HIGH (ref 0–99)
TRIGLYCERIDES: 23 mg/dL (ref <150)
Total CHOL/HDL Ratio: 2.1 RATIO
VLDL: 5 mg/dL (ref 0–40)

## 2017-11-30 MED ORDER — NITROGLYCERIN 0.4 MG SL SUBL: 0.4000 mg | SUBLINGUAL_TABLET | SUBLINGUAL | 1 refills | Status: AC | PRN

## 2017-11-30 MED ORDER — HYDRALAZINE HCL 10 MG PO TABS: 25.0000 mg | ORAL_TABLET | Freq: Three times a day (TID) | ORAL | 0 refills | Status: AC

## 2017-11-30 MED ORDER — TRAMADOL-ACETAMINOPHEN 37.5-325 MG PO TABS: 1.0000 | ORAL_TABLET | Freq: Four times a day (QID) | ORAL | 0 refills | Status: AC | PRN

## 2017-11-30 NOTE — Evaluation (Signed)
Physical Therapy Evaluation Patient Details Name: Kerri Hernandez MRN: [id number] DOB: 1936-01-06 Today's Date: 11/30/2017   History of Present Illness  Kerri Hernandez is a 82 y.o. female with medical history significant for remote TIA, GERD, diastolic CHF,history of myasthenia gravis (remote),  hypertension, hyperlipidemia, depression, iron deficiency anemia and anemia of chronic disease, CKD stage III, mild dementia, depression, with left-sided chest pain, starting yesterday afternoon, radiating to her arm.  The pain is dull, constant and at times accompanied by some dyspnea, nausea, and diaphoresis.     Clinical Impression  Pt admitted with above diagnosis. Pt currently with functional limitations due to the deficits listed below (see PT Problem List). PTA, pt living at ALF, pt is unreliable historian due to mild dementia but reports she has been ambulating with rollator and denies falls. Upon eval pt presents with mild deconditioning and weakness. Ambulating 50' this visit with stand by assist and no overt LOB. Patient would benefit from HHPT to progress balance and safety at home. Advised patient to minimize unsupervised OOB mobility until she has begun working with HHPT, pt agreeable. No family present to reinforce.  Pt will benefit from skilled PT to increase their independence and safety with mobility to allow discharge to the venue listed below.       Follow Up Recommendations Home health PT;Supervision for mobility/OOB    Equipment Recommendations  None recommended by PT    Recommendations for Other Services       Precautions / Restrictions Precautions Precautions: Fall Restrictions Weight Bearing Restrictions: No      Mobility  Bed Mobility Overal bed mobility: Modified Independent             General bed mobility comments: increased time and effort  Transfers Overall transfer level: Needs assistance Equipment used: Rolling walker (2 wheeled) Transfers: Sit  to/from Stand Sit to Stand: Min assist         General transfer comment: Min A to stand for stabilization   Ambulation/Gait Ambulation/Gait assistance: Min guard Gait Distance (Feet): 50 Feet Assistive device: Rolling walker (2 wheeled) Gait Pattern/deviations: Step-to pattern Gait velocity: decreased   General Gait Details: patient ambulating with mild weakness and unsteadiness noted. no overt LOB, able to walk with stand by assist and use of RW, VSS.   Stairs            Wheelchair Mobility    Modified Rankin (Stroke Patients Only)       Balance Overall balance assessment: Needs assistance   Sitting balance-Leahy Scale: Good       Standing balance-Leahy Scale: Poor                               Pertinent Vitals/Pain Pain Assessment: No/denies pain    Home Living Family/patient expects to be discharged to:: Assisted living               Home Equipment: Walker - 4 wheels      Prior Function Level of Independence: (pt poor historian, reports ambulating with RW )               Hand Dominance   Dominant Hand: Right    Extremity/Trunk Assessment   Upper Extremity Assessment Upper Extremity Assessment: Overall WFL for tasks assessed    Lower Extremity Assessment Lower Extremity Assessment: Generalized weakness       Communication   Communication: No difficulties  Cognition Arousal/Alertness: Awake/alert Behavior During Therapy: [facility]  for tasks assessed/performed Overall Cognitive Status: No family/caregiver present to determine baseline cognitive functioning                                        General Comments General comments (skin integrity, edema, etc.): Discussed importance of ambulating with supervision untill working with therapy, pt agreeable    Exercises     Assessment/Plan    PT Assessment Patient needs continued PT services  PT Problem List Decreased range of motion;Decreased  strength;Decreased activity tolerance;Decreased balance;Decreased mobility       PT Treatment Interventions DME instruction;Gait training;Stair training;Therapeutic activities;Functional mobility training;Therapeutic exercise;Balance training    PT Goals (Current goals can be found in the Care Plan section)  Acute Rehab PT Goals Patient Stated Goal: go back to ALF  PT Goal Formulation: With patient Time For Goal Achievement: 12/07/17 Potential to Achieve Goals: Good    Frequency Min 3X/week   Barriers to discharge        Co-evaluation               AM-PAC PT "6 Clicks" Daily Activity  Outcome Measure Difficulty turning over in bed (including adjusting bedclothes, sheets and blankets)?: A Little Difficulty moving from lying on back to sitting on the side of the bed? : A Little Difficulty sitting down on and standing up from a chair with arms (e.g., wheelchair, bedside commode, etc,.)?: A Little Help needed moving to and from a bed to chair (including a wheelchair)?: A Little Help needed walking in hospital room?: A Little Help needed climbing 3-5 steps with a railing? : A Little 6 Click Score: 18    End of Session Equipment Utilized During Treatment: Gait belt Activity Tolerance: Patient tolerated treatment well Patient left: in bed;with call bell/phone within reach Nurse Communication: Mobility status PT Visit Diagnosis: Unsteadiness on feet (R26.81);Difficulty in walking, not elsewhere classified (R26.2);Muscle weakness (generalized) (M62.81)    Time: 3343-5686 PT Time Calculation (min) (ACUTE ONLY): 25 min   Charges:   PT Evaluation $PT Eval Low Complexity: 1 Low PT Treatments $Gait Training: 8-22 mins   PT G Codes:        Reinaldo Berber, PT, DPT Acute Rehab Services Pager: [phone]    Reinaldo Berber 11/30/2017, 10:13 AM

## 2017-11-30 NOTE — Discharge Instructions (Signed)
Follow with Kerri Hernandez. Gales in 5-7 days  Please get a complete blood count and chemistry panel checked by your Primary MD at your next visit, and again as instructed by your Primary MD. Please get your medications reviewed and adjusted by your Primary MD.  Please request your Primary MD to go over all Hospital Tests and Procedure/Radiological results at the follow up, please get all Hospital records sent to your Prim MD by signing hospital release before you go home.  If you had Pneumonia of Lung problems at the Hospital: Please get a 2 view Chest X ray done in 6-8 weeks after hospital discharge or sooner if instructed by your Primary MD.  If you have Congestive Heart Failure: Please call your Cardiologist or Primary MD anytime you have any of the following symptoms:  1) 3 pound weight gain in 24 hours or 5 pounds in 1 week  2) shortness of breath, with or without a dry hacking cough  3) swelling in the hands, feet or stomach  4) if you have to sleep on extra pillows at night in order to breathe  Follow cardiac low salt diet and 1.5 lit/day fluid restriction.  If you have diabetes Accuchecks 4 times/day, Once in AM empty stomach and then before each meal. Log in all results and show them to your primary doctor at your next visit. If any glucose reading is under 80 or above 300 call your primary MD immediately.  If you have Seizure/Convulsions/Epilepsy: Please do not drive, operate heavy machinery, participate in activities at heights or participate in high speed sports until you have seen by Primary MD or a Neurologist and advised to do so again.  If you had Gastrointestinal Bleeding: Please ask your Primary MD to check a complete blood count within one week of discharge or at your next visit. Your endoscopic/colonoscopic biopsies that are pending at the time of discharge, will also need to followed by your Primary MD.  Get Medicines reviewed and adjusted. Please take all your  medications with you for your next visit with your Primary MD  Please request your Primary MD to go over all hospital tests and procedure/radiological results at the follow up, please ask your Primary MD to get all Hospital records sent to his/her office.  If you experience worsening of your admission symptoms, develop shortness of breath, life threatening emergency, suicidal or homicidal thoughts you must seek medical attention immediately by calling 911 or calling your MD immediately  if symptoms less severe.  You must read complete instructions/literature along with all the possible adverse reactions/side effects for all the Medicines you take and that have been prescribed to you. Take any new Medicines after you have completely understood and accpet all the possible adverse reactions/side effects.   Do not drive or operate heavy machinery when taking Pain medications.   Do not take more than prescribed Pain, Sleep and Anxiety Medications  Special Instructions: If you have smoked or chewed Tobacco  in the last 2 yrs please stop smoking, stop any regular Alcohol  and or any Recreational drug use.  Wear Seat belts while driving.  Please note You were cared for by a hospitalist during your hospital stay. If you have any questions about your discharge medications or the care you received while you were in the hospital after you are discharged, you can call the unit and asked to speak with the hospitalist on call if the hospitalist that took care of you is not available. Once  you are discharged, your primary care physician will handle any further medical issues. Please note that NO REFILLS for any discharge medications will be authorized once you are discharged, as it is imperative that you return to your primary care physician (or establish a relationship with a primary care physician if you do not have one) for your aftercare needs so that they can reassess your need for medications and monitor your  lab values.  You can reach the hospitalist office at phone [phone] or fax [phone]   If you do not have a primary care physician, you can call [phone] for a physician referral.  Activity: As tolerated with Full fall precautions use walker/cane & assistance as needed  Diet: regular  Disposition Home

## 2017-11-30 NOTE — Discharge Summary (Signed)
Physician Discharge Summary  Kerri Hernandez ALP:[id number] DOB: [birth year] DOA: 11/29/2017  PCP: Margreta Journey  Admit date: 11/29/2017 Discharge date: 11/30/2017  Admitted From: ALF Disposition:  ALF  Recommendations for Outpatient Follow-up:  1. Follow up with PCP in 1-2 weeks  Home Health: none Equipment/Devices: none  Discharge Condition: stable CODE STATUS: Full code Diet recommendation: regular  HPI: Per Dr. Evangeline Gula 82 year old female presents with complaints of chest pain appears to be reproducible however she does have a heart score of 4.  She will be placed in observation and ruled out for myocardial infarction.  We will consider stress testing has there has been no recent stress testing done.  Hospital Course: Chest pain -patient was admitted to the hospital with atypical chest pain which seem to be reproductively with palpation, suggesting a musculoskeletal etiology.  Given risk factors for cardiac disease, cardiology was consulted and evaluated patient.  For further evaluation, she underwent a coronary CT which had a coronary artery calcium score of 0, suggesting low risk for cardiac events, as well as no significant coronary artery disease.  Discussed with cardiology, her chest pain is atypical, unlikely to be cardiac in origin, and based on patient's description may be musculoskeletal versus GI in origin.  She will be discharged back to her assisted living in stable condition Hypertension - resume home medications, her hydralazine has been increased to 25 mg TID due to borderline high BP. Continue to monitor BP and adjust medications as medically needed.  Diastolic CHF -No acute decompensation, she is euvolemic. Last 2 D echo 11/21018 with EF 60-65, Gr 1 DD and normal systolic.  Asthma without exacerbation Osats normal  CXR NAD   WBC normal  Anemia of chronic disease Hemoglobin on admission 11 at baseline, no acute bleeding issues  Chronic kidney disease stage  -Current Cr is 1.16, stable GERD - may be at the root of her chest pain, continue PPI Urine incontinence -Continue Ditropan History of Dementia, depression -Continue Effexor, Abilify   Discharge Diagnoses:  Principal Problem:   Chest pain Active Problems:   Hypertension   Aortic valve regurgitation   Anemia in stage 3 chronic kidney disease (HCC)   Chronic diastolic heart failure (HCC)   Dementia without behavioral disturbance   Environmental and seasonal allergies   Left ventricular hypertrophy   Osteoarthritis of multiple joints   Urinary incontinence   Chest pain at rest     Discharge Instructions   Allergies as of 11/30/2017      Reactions   Influenza Vaccines Other (See Comments)   Arm Swelling   Paliperidone    Aspirin Rash   Reaction to regular aspirin - no reaction to low dose aspirin   Penicillins Rash   Has patient had a PCN reaction causing immediate rash, facial/tongue/throat swelling, SOB or lightheadedness with hypotension: Yes Has patient had a PCN reaction causing severe rash involving mucus membranes or skin necrosis: No Has patient had a PCN reaction that required hospitalization: Yes Has patient had a PCN reaction occurring within the last 10 years: No If all of the above answers are "NO", then may proceed with Cephalosporin use.   Sulfa Antibiotics Rash      Medication List    TAKE these medications   acetaminophen 500 MG tablet Commonly known as:  TYLENOL Take 2 tablets (1,000 mg total) by mouth every 6 (six) hours as needed. What changed:  reasons to take this   amLODipine 10 MG tablet Commonly known as:  NORVASC  Take 1 tablet (10 mg total) daily by mouth.   ARIPiprazole 15 MG tablet Commonly known as:  ABILIFY Take 15 mg by mouth daily.   cholecalciferol 1000 units tablet Commonly known as:  VITAMIN D Take 1,000 Units by mouth daily.   docusate sodium 100 MG capsule Commonly known as:  COLACE Take 1 capsule (100 mg total) by  mouth every 12 (twelve) hours.   ferrous sulfate 325 (65 FE) MG tablet Take 325 mg by mouth daily.   fluticasone 50 MCG/ACT nasal spray Commonly known as:  FLONASE Place 2 sprays into both nostrils daily.   furosemide 20 MG tablet Commonly known as:  LASIX Take 20 mg daily by mouth.   hydrALAZINE 10 MG tablet Commonly known as:  APRESOLINE Take 2.5 tablets (25 mg total) by mouth 3 (three) times daily. To use as needed if patient's blood pressure remains greater than 150/80 when taking regular medications as prescribed. What changed:  how much to take   loratadine 10 MG tablet Commonly known as:  CLARITIN Take 10 mg by mouth daily.   losartan 100 MG tablet Commonly known as:  COZAAR Take 100 mg by mouth daily.   nitroGLYCERIN 0.4 MG SL tablet Commonly known as:  NITROSTAT Place 1 tablet (0.4 mg total) under the tongue every 5 (five) minutes as needed for chest pain. For up to 3 consecutive doses *Do not crush*   oxybutynin 5 MG tablet Commonly known as:  DITROPAN Take 2.5 mg by mouth 2 (two) times daily.   pantoprazole 40 MG tablet Commonly known as:  PROTONIX Take 40 mg by mouth daily. Do not crush   phenazopyridine 200 MG tablet Commonly known as:  PYRIDIUM Take 1 tablet (200 mg total) by mouth 3 (three) times daily.   potassium chloride SA 20 MEQ tablet Commonly known as:  K-DUR,KLOR-CON Take 1 tablet (20 mEq total) by mouth 2 (two) times daily.   traMADol-acetaminophen 37.5-325 MG tablet Commonly known as:  ULTRACET Take 1 tablet by mouth every 6 (six) hours as needed for moderate pain or severe pain.   venlafaxine XR 150 MG 24 hr capsule Commonly known as:  EFFEXOR-XR Take 150 mg by mouth daily with breakfast.   VENTOLIN HFA 108 (90 Base) MCG/ACT inhaler Generic drug:  albuterol Take 2 puffs by mouth every 4 (four) hours as needed for wheezing.   vitamin C 500 MG tablet Commonly known as:  ASCORBIC ACID Take 1,000 mg by mouth daily.   vitamin E 1000  UNIT capsule Take 1,000 Units by mouth daily.      Follow-up Information    [facility], [place]. Schedule an appointment as soon as possible for a visit in 1 week(s).   Specialty:  Assisted Living Facility Contact information: [place] [address] [phone]        Pixie Casino, MD .   Specialty:  Cardiology Contact information: [place] [address] [phone]           Consultations:  Cardiology  Procedures/Studies:  Dg Chest 2 View  Result Date: 11/29/2017 CLINICAL DATA:  Chest pain EXAM: CHEST - 2 VIEW COMPARISON:  11/24/2017 FINDINGS: The heart size and mediastinal contours are within normal limits. Both lungs are clear. The visualized skeletal structures are unremarkable. IMPRESSION: No active cardiopulmonary disease. Electronically Signed   By: Ulyses Jarred M.D.   On: 11/29/2017 04:23   Dg Chest 2 View  Result Date: 11/24/2017 CLINICAL DATA:  Acute  onset of productive cough. EXAM: CHEST - 2 VIEW COMPARISON:  Chest radiograph performed 11/18/2017 FINDINGS: The lungs are well-aerated. Mild vascular congestion is suggested. There is no evidence of focal opacification, pleural effusion or pneumothorax. The heart is borderline enlarged; the mediastinal contour is within normal limits. No acute osseous abnormalities are seen. IMPRESSION: Mild vascular congestion suggested; lungs remain grossly clear. Electronically Signed   By: Garald Balding M.D.   On: 11/24/2017 05:21   Dg Chest 2 View  Result Date: 11/18/2017 CLINICAL DATA:  Acute onset of dysuria and right arm pain. EXAM: CHEST - 2 VIEW COMPARISON:  Chest radiograph performed 09/15/2017 FINDINGS: The lungs are well-aerated and clear. There is no evidence of focal opacification, pleural effusion or pneumothorax. The heart is borderline enlarged. No acute osseous abnormalities are seen. IMPRESSION: Borderline cardiomegaly; no acute cardiopulmonary process seen.  Electronically Signed   By: Garald Balding M.D.   On: 11/18/2017 22:01   Ct Coronary Morph W/cta Cor W/score W/ca W/cm &/or Wo/cm  Result Date: 11/29/2017 CLINICAL DATA:  Chest pain EXAM: Cardiac CTA MEDICATIONS: Sub lingual nitro.4mg  x 2 TECHNIQUE: The patient was scanned on a Siemens 619 slice scanner. Gantry rotation speed was 250 msecs. Collimation was 0.6 mm. A 100 kV prospective scan was triggered in the ascending thoracic aorta at 35-75% of the R-R interval. Average HR during the scan was 60 bpm. The 3D data set was interpreted on a dedicated work station using MPR, MIP and VRT modes. A total of 80cc of contrast was used. FINDINGS: Non-cardiac: See separate report from [facility]. Calcium Score: 0 Agatston units. Coronary Arteries: Right dominant with no anomalies LM: No plaque or stenosis LAD system: No plaque or stenosis Circumflex system: No plaque or stenosis. RCA system: No plaque or stenosis. IMPRESSION: 1. Coronary artery calcium score 0 Agatston units, suggesting low risk for future cardiac events. 2.  No significant coronary artery disease noted. Dalton Mclean Electronically Signed   By: Loralie Champagne M.D.   On: 11/29/2017 17:01    Subjective: - no chest pain, shortness of breath, no abdominal pain, nausea or vomiting.   Discharge Exam: Vitals:   11/30/17 0358 11/30/17 0859  BP: (!) 159/82 (!) 168/73  Pulse: (!) 51 (!) 58  Resp: 18   Temp: 98.3 F (36.8 C) 98.6 F (37 C)  SpO2: 100% 100%    General: Pt is alert, awake, not in acute distress Cardiovascular: RRR, S1/S2 +, no rubs, no gallops Respiratory: CTA bilaterally, no wheezing, no rhonchi Abdominal: Soft, NT, ND, bowel sounds + Extremities: no edema, no cyanosis    The results of significant diagnostics from this hospitalization (including imaging, microbiology, ancillary and laboratory) are listed below for reference.     Microbiology: Recent Results (from the past 240 hour(s))  MRSA PCR Screening      Status: None   Collection Time: 11/29/17 12:46 PM  Result Value Ref Range Status   MRSA by PCR NEGATIVE NEGATIVE Final    Comment:        The GeneXpert MRSA Assay (FDA approved for NASAL specimens only), is one component of a comprehensive MRSA colonization surveillance program. It is not intended to diagnose MRSA infection nor to guide or monitor treatment for MRSA infections. Performed at [facility], [place] [address] [place],  [address]      Labs: BNP (last 3 results) No results for input(s): BNP in the last 8760 hours. Basic Metabolic Panel: Recent Labs  Lab 11/24/17 0444 11/28/17  1058 11/29/17 0334  NA 142 143 140  K 3.2* 3.3* 3.4*  CL 105 104 102  CO2 30 29 29   GLUCOSE 90 83 92  BUN 13 15 17   CREATININE 1.02* 0.94 1.16*  CALCIUM 8.9 9.4 9.4   Liver Function Tests: Recent Labs  Lab 11/24/17 0444  AST 19  ALT 11*  ALKPHOS 64  BILITOT 0.7  PROT 6.6  ALBUMIN 3.4*   Recent Labs  Lab 11/24/17 0444  LIPASE 28   No results for input(s): AMMONIA in the last 168 hours. CBC: Recent Labs  Lab 11/24/17 0444 11/28/17 1058 11/29/17 0334  WBC 6.0 7.9 6.7  NEUTROABS  --  4.6 3.3  HGB 10.5* 12.2 11.0*  HCT 33.7* 38.3 35.3*  MCV 89.4 89.7 88.0  PLT 230 241 258   Cardiac Enzymes: Recent Labs  Lab 11/29/17 0641 11/29/17 1208 11/29/17 1849  TROPONINI <0.03 <0.03 <0.03   BNP: Invalid input(s): POCBNP CBG: No results for input(s): GLUCAP in the last 168 hours. D-Dimer No results for input(s): DDIMER in the last 72 hours. Hgb A1c No results for input(s): HGBA1C in the last 72 hours. Lipid Profile Recent Labs    11/30/17 0322  CHOL 206*  HDL 98  LDLCALC 103*  TRIG 23  CHOLHDL 2.1   Thyroid function studies No results for input(s): TSH, T4TOTAL, T3FREE, THYROIDAB in the last 72 hours.  Invalid input(s): FREET3 Anemia work up Recent Labs    11/29/17 0846  VITAMINB12 587  FOLATE 15.3  FERRITIN 65  TIBC 232*  IRON 63    RETICCTPCT 0.9   Urinalysis    Component Value Date/Time   COLORURINE YELLOW 11/24/2017 [place] 11/24/2017 0445   LABSPEC 1.013 11/24/2017 0445   PHURINE 6.0 11/24/2017 0445   GLUCOSEU NEGATIVE 11/24/2017 0445   HGBUR NEGATIVE 11/24/2017 0445   [place] NEGATIVE 11/24/2017 0445   KETONESUR NEGATIVE 11/24/2017 0445   PROTEINUR NEGATIVE 11/24/2017 0445   UROBILINOGEN 0.2 09/26/2010 0544   NITRITE NEGATIVE 11/24/2017 0445   LEUKOCYTESUR NEGATIVE 11/24/2017 0445   Sepsis Labs Invalid input(s): PROCALCITONIN,  WBC,  LACTICIDVEN   Time coordinating discharge: 35 minutes  SIGNED:  Marzetta Board, MD  [facility] 11/30/2017, 9:51 AM Pager [phone]  If 7PM-7AM, please contact night-coverage [email or web address] Password [email or web address]

## 2017-11-30 NOTE — NC FL2 (Signed)
[place] LEVEL OF CARE SCREENING TOOL     IDENTIFICATION  Patient Name: Kerri Hernandez Birthdate: 1935/06/26 Sex: female Admission Date (Current Location): 11/29/2017  [facility] and [place] Number:  Herbalist and Address:  [facility]. [facility], [place] [address] [place], [place] [address]      Provider Number: [id number]  Attending Physician Name and Address:  Caren Griffins, MD  Relative Name and Phone Number:  Kerri Hernandez [phone]    Current Level of Care: Hospital Recommended Level of Care: [place] Prior Approval Number:    Date Approved/Denied:   PASRR Number:    Discharge Plan: Other (Comment)([facility])    Current Diagnoses: Patient Active Problem List   Diagnosis Date Noted  . Chest pain at rest   . Pharyngeal foreign body 08/09/2017  . Pressure injury of skin 04/30/2017  . Chest pain 04/29/2017  . Hypertension 04/29/2017  . Aortic valve regurgitation   . Dementia without behavioral disturbance 01/04/2017  . Anemia in stage 3 chronic kidney disease ([place]) 11/30/2016  . Chronic diastolic heart failure ([place]) 11/30/2016  . Environmental and seasonal allergies 11/21/2016  . Urinary incontinence 07/18/2016  . Osteoarthritis of multiple joints 07/15/2015  . HLD (hyperlipidemia) 03/04/2013  . Left ventricular hypertrophy 01/18/2013    Orientation RESPIRATION BLADDER Height & Weight     Self, Time, Situation, Place  Normal Continent Weight: 127 lb 8 oz (57.8 kg) Height:  5\' 6"  (167.6 cm)  BEHAVIORAL SYMPTOMS/MOOD NEUROLOGICAL BOWEL NUTRITION STATUS  (NA)   Continent Diet  AMBULATORY STATUS COMMUNICATION OF NEEDS Skin   Limited Assist Verbally Normal                       Personal Care Assistance Level of Assistance  Bathing, Dressing Bathing Assistance: Limited assistance   Dressing Assistance: Limited assistance     Functional Limitations Info  Speech, Hearing,  Sight Sight Info: Impaired(wears glasses) Hearing Info: Adequate Speech Info: Adequate    SPECIAL CARE FACTORS FREQUENCY  PT (By licensed PT)     PT Frequency: 3 X weekly              Contractures Contractures Info: Not present    Additional Factors Info  Code Status, Allergies Code Status Info: Full Allergies Info: Influenza Vaccines,Paliperidone,aspirin,penicillins,sulfa antibiotics           Current Medications (11/30/2017):  Discharge Medications: TAKE these medications   acetaminophen 500 MG tablet Commonly known as:  TYLENOL Take 2 tablets (1,000 mg total) by mouth every 6 (six) hours as needed. What changed:  reasons to take this   amLODipine 10 MG tablet Commonly known as:  NORVASC Take 1 tablet (10 mg total) daily by mouth.   ARIPiprazole 15 MG tablet Commonly known as:  ABILIFY Take 15 mg by mouth daily.   cholecalciferol 1000 units tablet Commonly known as:  VITAMIN D Take 1,000 Units by mouth daily.   docusate sodium 100 MG capsule Commonly known as:  COLACE Take 1 capsule (100 mg total) by mouth every 12 (twelve) hours.   ferrous sulfate 325 (65 FE) MG tablet Take 325 mg by mouth daily.   fluticasone 50 MCG/ACT nasal spray Commonly known as:  FLONASE Place 2 sprays into both nostrils daily.   furosemide 20 MG tablet Commonly known as:  LASIX Take 20 mg daily by mouth.   hydrALAZINE 10 MG tablet Commonly known as:  APRESOLINE Take 2.5  tablets (25 mg total) by mouth 3 (three) times daily. To use as needed if patient's blood pressure remains greater than 150/80 when taking regular medications as prescribed. What changed:  how much to take   loratadine 10 MG tablet Commonly known as:  CLARITIN Take 10 mg by mouth daily.   losartan 100 MG tablet Commonly known as:  COZAAR Take 100 mg by mouth daily.   nitroGLYCERIN 0.4 MG SL tablet Commonly known as:  NITROSTAT Place 1 tablet (0.4 mg total) under the tongue every 5 (five)  minutes as needed for chest pain. For up to 3 consecutive doses *Do not crush*   oxybutynin 5 MG tablet Commonly known as:  DITROPAN Take 2.5 mg by mouth 2 (two) times daily.   pantoprazole 40 MG tablet Commonly known as:  PROTONIX Take 40 mg by mouth daily. Do not crush   phenazopyridine 200 MG tablet Commonly known as:  PYRIDIUM Take 1 tablet (200 mg total) by mouth 3 (three) times daily.   potassium chloride SA 20 MEQ tablet Commonly known as:  K-DUR,KLOR-CON Take 1 tablet (20 mEq total) by mouth 2 (two) times daily.   traMADol-acetaminophen 37.5-325 MG tablet Commonly known as:  ULTRACET Take 1 tablet by mouth every 6 (six) hours as needed for moderate pain or severe pain.   venlafaxine XR 150 MG 24 hr capsule Commonly known as:  EFFEXOR-XR Take 150 mg by mouth daily with breakfast.   VENTOLIN HFA 108 (90 Base) MCG/ACT inhaler Generic drug:  albuterol Take 2 puffs by mouth every 4 (four) hours as needed for wheezing.   vitamin C 500 MG tablet Commonly known as:  ASCORBIC ACID Take 1,000 mg by mouth daily.   vitamin E 1000 UNIT capsule Take 1,000 Units by mouth daily.        Please see discharge summary for a list of discharge medications.  Relevant Imaging Results:  Relevant Lab Results:   Additional Information SS# [id number]  Carolin Sicks, [place]

## 2017-11-30 NOTE — Progress Notes (Signed)
Patient will Discharge To: [place] Date:6/15 Family Notified: yes, Sheri Gatchel [phone] Transport By: Corey Harold   Per MD patient ready for DC to [facility] . RN, patient, patient's family, and facility notified of DC. Assessment, Fl2/Pasrr, and Discharge Summary sent to facility. RN given number for report [phone] ask for Santiago Glad). DC packet on chart. Ambulance transport requested for patient.   CSW signing off.  Reed Breech LCSWA [phone]

## 2017-11-30 NOTE — Progress Notes (Signed)
Report called to Kateri Mc, Resident coordinator at [facility]. Questions answered and prescriptions scripts being sent with patient communicated. Awaiting [facility] to transfer patient out. VS - T P R BP 98.Marland Kitchen0 / 66 / 18 / 146/68 O2 100%. Shyrl Obi Ladora Daniel, BSN, RN

## 2017-11-30 NOTE — Progress Notes (Signed)
Dr Theodosia Blender not reviewed. Coronary CTA with coronary calcium score of 0, no disease detected. Symptoms are not cardiac in origin, no further cardiac testing planned at this time.   [facility] will sign off.   Medication Recommendations:  Per primary team Other recommendations (labs, testing, etc):  none Follow up as an outpatient:  No cardiac f//u needed.

## 2017-12-04 DIAGNOSIS — F458 Other somatoform disorders: Secondary | ICD-10-CM | POA: Diagnosis not present

## 2017-12-04 DIAGNOSIS — R079 Chest pain, unspecified: Secondary | ICD-10-CM | POA: Diagnosis not present

## 2017-12-05 ENCOUNTER — Other Ambulatory Visit: Payer: Self-pay

## 2017-12-05 ENCOUNTER — Emergency Department (HOSPITAL_COMMUNITY)
Admission: EM | Admit: 2017-12-05 | Discharge: 2017-12-05 | Disposition: A | Discharge status: Home / Self Care | Payer: Medicare Other | Attending: Emergency Medicine | Admitting: Emergency Medicine

## 2017-12-05 ENCOUNTER — Encounter (HOSPITAL_COMMUNITY): Payer: Self-pay | Admitting: Emergency Medicine

## 2017-12-05 ENCOUNTER — Emergency Department (HOSPITAL_COMMUNITY): Payer: Medicare Other

## 2017-12-05 DIAGNOSIS — R079 Chest pain, unspecified: Secondary | ICD-10-CM | POA: Diagnosis not present

## 2017-12-05 DIAGNOSIS — F458 Other somatoform disorders: Secondary | ICD-10-CM | POA: Diagnosis not present

## 2017-12-05 DIAGNOSIS — I5032 Chronic diastolic (congestive) heart failure: Secondary | ICD-10-CM | POA: Diagnosis not present

## 2017-12-05 DIAGNOSIS — R198 Other specified symptoms and signs involving the digestive system and abdomen: Secondary | ICD-10-CM

## 2017-12-05 DIAGNOSIS — J45909 Unspecified asthma, uncomplicated: Secondary | ICD-10-CM | POA: Insufficient documentation

## 2017-12-05 DIAGNOSIS — I110 Hypertensive heart disease with heart failure: Secondary | ICD-10-CM | POA: Diagnosis not present

## 2017-12-05 DIAGNOSIS — Z79899 Other long term (current) drug therapy: Secondary | ICD-10-CM | POA: Diagnosis not present

## 2017-12-05 DIAGNOSIS — Z8673 Personal history of transient ischemic attack (TIA), and cerebral infarction without residual deficits: Secondary | ICD-10-CM | POA: Insufficient documentation

## 2017-12-05 DIAGNOSIS — R0989 Other specified symptoms and signs involving the circulatory and respiratory systems: Secondary | ICD-10-CM | POA: Insufficient documentation

## 2017-12-05 DIAGNOSIS — F0390 Unspecified dementia without behavioral disturbance: Secondary | ICD-10-CM | POA: Insufficient documentation

## 2017-12-05 DIAGNOSIS — Z87891 Personal history of nicotine dependence: Secondary | ICD-10-CM | POA: Diagnosis not present

## 2017-12-05 DIAGNOSIS — Z859 Personal history of malignant neoplasm, unspecified: Secondary | ICD-10-CM | POA: Diagnosis not present

## 2017-12-05 MED ORDER — LIDOCAINE VISCOUS HCL 2 % MT SOLN
15.0000 mL | Freq: Once | OROMUCOSAL | Status: AC
  Administered 2017-12-05: 15 mL via OROMUCOSAL
  Filled 2017-12-05: qty 15

## 2017-12-05 NOTE — ED Notes (Addendum)
I spoke with Tillie Rung, [employer] at [facility] about pt not needing to stay in hospital and returning to the residence. I also informed Tillie Rung that Ms Alen will need to have a follow up with her physician at the residence.

## 2017-12-05 NOTE — ED Notes (Signed)
Two unsuccessful attempts to obtain blood specimens.

## 2017-12-05 NOTE — ED Triage Notes (Signed)
Pt arrived [facility] from [facility] with chest pain that started tonight after dinner. Pt reports central chest pain that is radiating to her throat. Pt. States she has trouble swallowing and was eating tonight food that was hard to swallow and then reported chest pain with the feeling of something caught in her throat. Pt states CP is gone but she still has pain in her throat. Pt given 324mg  Aspirin from EMS. Facility reports she was given nitro by them. Pt reports she was not given any nitro from facility.

## 2017-12-05 NOTE — Discharge Instructions (Signed)
Return to the emergency department if you are unable to swallow, or have difficulty breathing.  Otherwise follow-up with your ear nose and throat physician.

## 2017-12-05 NOTE — ED Notes (Signed)
[facility] contacted for tx back to [facility]

## 2017-12-05 NOTE — ED Provider Notes (Signed)
[facility] EMERGENCY DEPARTMENT Provider Note   CSN: [id number] Arrival date & time: 12/05/17  0002     History   Chief Complaint Chief Complaint  Patient presents with  . Chest Pain    HPI Kerri Hernandez is a 82 y.o. female.  82 yo F with a chief complaint of painful swallowing.  The patient was eating tacos this evening and felt like the shell got stuck in the back of her throat.  Has been able to tolerate liquids but not solids.  Denies vomiting denies shortness of breath denies wheezing.  Denies pain in the chest.  The history is provided by the patient.  Chest Pain   This is a new problem. The current episode started 1 to 2 hours ago. The problem occurs constantly. The problem has not changed since onset.Pain location: neck. The pain is at a severity of 2/10. The pain is mild. The quality of the pain is described as brief. The pain does not radiate. Duration of episode(s) is 2 hours. Pertinent negatives include no dizziness, no fever, no headaches, no nausea, no palpitations, no shortness of breath and no vomiting.    Past Medical History:  Diagnosis Date  . Arthritis   . Asthma   . Cancer ([place])   . CHF (congestive heart failure) ([place])   . Hypertension   . TIA (transient ischemic attack)     Patient Active Problem List   Diagnosis Date Noted  . Chest pain at rest   . Pharyngeal foreign body 08/09/2017  . Pressure injury of skin 04/30/2017  . Chest pain 04/29/2017  . Hypertension 04/29/2017  . Aortic valve regurgitation   . Dementia without behavioral disturbance 01/04/2017  . Anemia in stage 3 chronic kidney disease ([place]) 11/30/2016  . Chronic diastolic heart failure ([place]) 11/30/2016  . Environmental and seasonal allergies 11/21/2016  . Urinary incontinence 07/18/2016  . Osteoarthritis of multiple joints 07/15/2015  . HLD (hyperlipidemia) 03/04/2013  . Left ventricular hypertrophy 01/18/2013    Past Surgical History:  Procedure  Laterality Date  . ABDOMINAL HYSTERECTOMY    . DIRECT LARYNGOSCOPY N/A 08/09/2017   Procedure: DIRECT  laryngoscopy and rigid esophagoscopy;  Surgeon: Melida Quitter, MD;  Location: [facility];  Service: ENT;  Laterality: N/A;  . MASTECTOMY       OB History   None      Home Medications    Prior to Admission medications   Medication Sig Start Date End Date Taking? Authorizing Provider  acetaminophen (TYLENOL) 500 MG tablet Take 2 tablets (1,000 mg total) by mouth every 6 (six) hours as needed. Patient taking differently: Take 1,000 mg by mouth every 6 (six) hours as needed for mild pain, moderate pain, fever or headache.  07/31/17   Charlesetta Shanks, MD  amLODipine (NORVASC) 10 MG tablet Take 1 tablet (10 mg total) daily by mouth. 05/01/17   Burgess Estelle, MD  ARIPiprazole (ABILIFY) 15 MG tablet Take 15 mg by mouth daily. 11/04/17   [provider]  cholecalciferol (VITAMIN D) 1000 units tablet Take 1,000 Units by mouth daily.    [provider]  docusate sodium (COLACE) 100 MG capsule Take 1 capsule (100 mg total) by mouth every 12 (twelve) hours. 09/28/17   Tanna Furry, MD  ferrous sulfate 325 (65 FE) MG tablet Take 325 mg by mouth daily.    [provider]  fluticasone (FLONASE) 50 MCG/ACT nasal spray Place 2 sprays into both nostrils daily.     [provider]  furosemide (LASIX) 20 MG tablet Take 20 mg daily by mouth.    [provider]  hydrALAZINE (APRESOLINE) 10 MG tablet Take 2.5 tablets (25 mg total) by mouth 3 (three) times daily. To use as needed if patient's blood pressure remains greater than 150/80 when taking regular medications as prescribed. 11/30/17   Caren Griffins, MD  loratadine (CLARITIN) 10 MG tablet Take 10 mg by mouth daily.    [provider]  losartan (COZAAR) 100 MG tablet Take 100 mg by mouth daily.    [provider]  nitroGLYCERIN (NITROSTAT) 0.4 MG SL tablet Place 1 tablet (0.4 mg total) under the  tongue every 5 (five) minutes as needed for chest pain. For up to 3 consecutive doses *Do not crush* 11/30/17   Caren Griffins, MD  oxybutynin (DITROPAN) 5 MG tablet Take 2.5 mg by mouth 2 (two) times daily.    [provider]  pantoprazole (PROTONIX) 40 MG tablet Take 40 mg by mouth daily. Do not crush    [provider]  phenazopyridine (PYRIDIUM) 200 MG tablet Take 1 tablet (200 mg total) by mouth 3 (three) times daily. Patient not taking: Reported on 4/48/1856 08/17/47   Delora Fuel, MD  potassium chloride SA (K-DUR,KLOR-CON) 20 MEQ tablet Take 1 tablet (20 mEq total) by mouth 2 (two) times daily. 7/0/26   Delora Fuel, MD  traMADol-acetaminophen (ULTRACET) 37.5-325 MG tablet Take 1 tablet by mouth every 6 (six) hours as needed for moderate pain or severe pain. 11/30/17   Caren Griffins, MD  venlafaxine XR (EFFEXOR-XR) 150 MG 24 hr capsule Take 150 mg by mouth daily with breakfast.    [provider]  VENTOLIN HFA 108 (90 Base) MCG/ACT inhaler Take 2 puffs by mouth every 4 (four) hours as needed for wheezing.  11/15/17   [provider]  vitamin C (ASCORBIC ACID) 500 MG tablet Take 1,000 mg by mouth daily.    [provider]  vitamin E 1000 UNIT capsule Take 1,000 Units by mouth daily.    [provider]    Family History Family History  Problem Relation Age of Onset  . Heart attack Father     Social History Social History   Tobacco Use  . Smoking status: Former Research scientist (life sciences)  . Smokeless tobacco: Never Used  . Tobacco comment: As a teenager.  Substance Use Topics  . Alcohol use: No    Comment: Occasional wine in the past  . Drug use: No     Allergies   Influenza vaccines; Paliperidone; Aspirin; Penicillins; and Sulfa antibiotics   Review of Systems Review of Systems  Constitutional: Negative for chills and fever.  HENT: Positive for sore throat and trouble swallowing. Negative for congestion and rhinorrhea.   Eyes:  Negative for redness and visual disturbance.  Respiratory: Negative for shortness of breath and wheezing.   Cardiovascular: Negative for chest pain and palpitations.  Gastrointestinal: Negative for nausea and vomiting.  Genitourinary: Negative for dysuria and urgency.  Musculoskeletal: Negative for arthralgias and myalgias.  Skin: Negative for pallor and wound.  Neurological: Negative for dizziness and headaches.     Physical Exam Updated Vital Signs BP (!) 181/82   Pulse (!) 49   Temp 98 F (36.7 C) (Oral)   Resp 14   Ht 5' (1.524 m)   Wt 57.6 kg (127 lb)   SpO2 100%   BMI 24.80 kg/m   Physical Exam  Constitutional: She is oriented to person, place, and  time. She appears well-developed and well-nourished. No distress.  HENT:  Head: Normocephalic and atraumatic.  Tolerating secretions without difficulty full range of motion of the neck  Eyes: Pupils are equal, round, and reactive to light. EOM are normal.  Neck: Normal range of motion. Neck supple.  Cardiovascular: Normal rate and regular rhythm. Exam reveals no gallop and no friction rub.  No murmur heard. Pulmonary/Chest: Effort normal. She has no wheezes. She has no rales.  Abdominal: Soft. She exhibits no distension. There is no tenderness.  Musculoskeletal: She exhibits no edema or tenderness.  Neurological: She is alert and oriented to person, place, and time.  Skin: Skin is warm and dry. She is not diaphoretic.  Psychiatric: She has a normal mood and affect. Her behavior is normal.  Nursing note and vitals reviewed.    ED Treatments / Results  Labs (all labs ordered are listed, but only abnormal results are displayed) Labs Reviewed - No data to display  EKG EKG Interpretation  Date/Time:  Thursday December 05 2017 00:08:34 EDT Ventricular Rate:  49 PR Interval:    QRS Duration: 96 QT Interval:  438 QTC Calculation: 396 R Axis:   24 Text Interpretation:  Sinus or ectopic atrial bradycardia Probable LVH with  secondary repol abnrm Minimal ST elevation, inferior leads No significant change since last tracing Confirmed by Deno Etienne [phone]) on 12/05/2017 1:57:05 AM   Radiology Dg Chest 2 View  Result Date: 12/05/2017 CLINICAL DATA:  82 y/o  F; chest pain. EXAM: CHEST - 2 VIEW COMPARISON:  11/29/2017 chest radiograph and chest CT. FINDINGS: Stable cardiomegaly given projection and technique. No focal consolidation, effusion, or pneumothorax. Multilevel discogenic degenerative changes of the visible spine. No acute osseous abnormality is evident. IMPRESSION: Cardiomegaly.  No acute pulmonary process identified. Electronically Signed   By: Kristine Garbe M.D.   On: 12/05/2017 00:43    Procedures Procedures (including critical care time)  Medications Ordered in ED Medications  lidocaine (XYLOCAINE) 2 % viscous mouth solution 15 mL (15 mLs Mouth/Throat Given 12/05/17 0118)     Initial Impression / Assessment and Plan / ED Course  I have reviewed the triage vital signs and the nursing notes.  Pertinent labs & imaging results that were available during my care of the patient were reviewed by me and considered in my medical decision making (see chart for details).     82 yo F with a chief complaint of pain in her neck.  The patient states that she was eating tacos and felt that the hard shell got stuck in the back of her throat.  She was able to cough some of it out but still has some pain to the posterior oropharynx.  She has been able to drink but does not think she can eat.  Evaluated by triage and a chest pain rule out was ordered.  I do not feel that the patient has chest pain.  I do not feel she needs this work-up.  Labs are canceled.  Chest x-ray is unremarkable as viewed by me.  We will give the patient some oral trial and reassess.  Patient is able to eat crackers and drink Coca-Cola.  She finished a whole can of Coke without any issue.  At this point I will have her follow-up in the  office.  She told me she is seeing somebody as an outpatient in the past.  Old records were reviewed and the patient had a foreign body in the hypopharynx and was seen  by Dr. Redmond Baseman.  I will refer her back to him.  2:33 AM:  I have discussed the diagnosis/risks/treatment options with the patient and believe the pt to be eligible for discharge home to follow-up with PCP, ENT. We also discussed returning to the ED immediately if new or worsening sx occur. We discussed the sx which are most concerning (e.g., sudden worsening pain, fever, inability to tolerate by mouth) that necessitate immediate return. Medications administered to the patient during their visit and any new prescriptions provided to the patient are listed below.  Medications given during this visit Medications  lidocaine (XYLOCAINE) 2 % viscous mouth solution 15 mL (15 mLs Mouth/Throat Given 12/05/17 0118)      The patient appears reasonably screen and/or stabilized for discharge and I doubt any other medical condition or other [facility] requiring further screening, evaluation, or treatment in the ED at this time prior to discharge.    Final Clinical Impressions(s) / ED Diagnoses   Final diagnoses:  Globus sensation    ED Discharge Orders    None       Deno Etienne, DO 12/05/17 0459

## 2017-12-08 ENCOUNTER — Emergency Department (HOSPITAL_COMMUNITY): Payer: Medicare Other

## 2017-12-08 ENCOUNTER — Emergency Department (HOSPITAL_COMMUNITY)
Admission: EM | Admit: 2017-12-08 | Discharge: 2017-12-08 | Disposition: A | Discharge status: Skilled Nursing Facility | Payer: Medicare Other | Attending: Emergency Medicine | Admitting: Emergency Medicine

## 2017-12-08 ENCOUNTER — Other Ambulatory Visit: Payer: Self-pay

## 2017-12-08 ENCOUNTER — Encounter (HOSPITAL_COMMUNITY): Payer: Self-pay | Admitting: Emergency Medicine

## 2017-12-08 DIAGNOSIS — I5032 Chronic diastolic (congestive) heart failure: Secondary | ICD-10-CM | POA: Diagnosis not present

## 2017-12-08 DIAGNOSIS — R05 Cough: Secondary | ICD-10-CM | POA: Diagnosis not present

## 2017-12-08 DIAGNOSIS — Z79899 Other long term (current) drug therapy: Secondary | ICD-10-CM | POA: Insufficient documentation

## 2017-12-08 DIAGNOSIS — J45909 Unspecified asthma, uncomplicated: Secondary | ICD-10-CM | POA: Diagnosis not present

## 2017-12-08 DIAGNOSIS — Z87891 Personal history of nicotine dependence: Secondary | ICD-10-CM | POA: Diagnosis not present

## 2017-12-08 DIAGNOSIS — Z859 Personal history of malignant neoplasm, unspecified: Secondary | ICD-10-CM | POA: Diagnosis not present

## 2017-12-08 DIAGNOSIS — R300 Dysuria: Secondary | ICD-10-CM

## 2017-12-08 DIAGNOSIS — F0390 Unspecified dementia without behavioral disturbance: Secondary | ICD-10-CM | POA: Diagnosis not present

## 2017-12-08 DIAGNOSIS — I110 Hypertensive heart disease with heart failure: Secondary | ICD-10-CM | POA: Insufficient documentation

## 2017-12-08 DIAGNOSIS — R059 Cough, unspecified: Secondary | ICD-10-CM

## 2017-12-08 LAB — CBC WITH DIFFERENTIAL/PLATELET
BASOS ABS: 0 10*3/uL (ref 0.0–0.1)
Basophils Relative: 1 %
EOS PCT: 1 %
Eosinophils Absolute: 0.1 10*3/uL (ref 0.0–0.7)
HEMATOCRIT: 32.9 % — AB (ref 36.0–46.0)
HEMOGLOBIN: 10.5 g/dL — AB (ref 12.0–15.0)
LYMPHS ABS: 2.1 10*3/uL (ref 0.7–4.0)
LYMPHS PCT: 37 %
MCH: 28.8 pg (ref 26.0–34.0)
MCHC: 31.9 g/dL (ref 30.0–36.0)
MCV: 90.1 fL (ref 78.0–100.0)
Monocytes Absolute: 0.6 10*3/uL (ref 0.1–1.0)
Monocytes Relative: 11 %
NEUTROS ABS: 2.8 10*3/uL (ref 1.7–7.7)
NEUTROS PCT: 50 %
Platelets: 239 10*3/uL (ref 150–400)
RBC: 3.65 MIL/uL — AB (ref 3.87–5.11)
RDW: 15.5 % (ref 11.5–15.5)
WBC: 5.5 10*3/uL (ref 4.0–10.5)

## 2017-12-08 LAB — URINALYSIS, ROUTINE W REFLEX MICROSCOPIC
BILIRUBIN URINE: NEGATIVE
Glucose, UA: NEGATIVE mg/dL
HGB URINE DIPSTICK: NEGATIVE
Ketones, ur: NEGATIVE mg/dL
Leukocytes, UA: NEGATIVE
NITRITE: NEGATIVE
Protein, ur: NEGATIVE mg/dL
SPECIFIC GRAVITY, URINE: 1.009 (ref 1.005–1.030)
pH: 7 (ref 5.0–8.0)

## 2017-12-08 LAB — COMPREHENSIVE METABOLIC PANEL
ALK PHOS: 64 U/L (ref 38–126)
ALT: 11 U/L — AB (ref 14–54)
AST: 19 U/L (ref 15–41)
Albumin: 3.7 g/dL (ref 3.5–5.0)
Anion gap: 8 (ref 5–15)
BILIRUBIN TOTAL: 0.4 mg/dL (ref 0.3–1.2)
BUN: 15 mg/dL (ref 6–20)
CALCIUM: 8.7 mg/dL — AB (ref 8.9–10.3)
CO2: 30 mmol/L (ref 22–32)
CREATININE: 0.88 mg/dL (ref 0.44–1.00)
Chloride: 105 mmol/L (ref 101–111)
Glucose, Bld: 77 mg/dL (ref 65–99)
Potassium: 3.2 mmol/L — ABNORMAL LOW (ref 3.5–5.1)
Sodium: 143 mmol/L (ref 135–145)
Total Protein: 6.9 g/dL (ref 6.5–8.1)

## 2017-12-08 NOTE — ED Notes (Signed)
Patient cleaned up and linen changed.
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
## 2017-12-08 NOTE — ED Triage Notes (Signed)
Patient brought in by In[facility]. Patient is complaining of burning when urinating. Patient states that urine is dark orange and foul smelling. Assisted Living ([facility]).

## 2017-12-08 NOTE — ED Notes (Signed)
Bed: XB28 Expected date:  Expected time:  Means of arrival:  Comments: 57 F UTI from SNF
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
## 2017-12-11 ENCOUNTER — Other Ambulatory Visit: Payer: Self-pay

## 2017-12-11 ENCOUNTER — Encounter (HOSPITAL_COMMUNITY): Payer: Self-pay | Admitting: Emergency Medicine

## 2017-12-11 ENCOUNTER — Emergency Department (HOSPITAL_COMMUNITY)
Admission: EM | Admit: 2017-12-11 | Discharge: 2017-12-11 | Disposition: A | Discharge status: Home / Self Care | Payer: Medicare Other | Attending: Emergency Medicine | Admitting: Emergency Medicine

## 2017-12-11 ENCOUNTER — Emergency Department (HOSPITAL_COMMUNITY): Payer: Medicare Other

## 2017-12-11 DIAGNOSIS — Z79899 Other long term (current) drug therapy: Secondary | ICD-10-CM | POA: Diagnosis not present

## 2017-12-11 DIAGNOSIS — F0390 Unspecified dementia without behavioral disturbance: Secondary | ICD-10-CM | POA: Diagnosis not present

## 2017-12-11 DIAGNOSIS — I110 Hypertensive heart disease with heart failure: Secondary | ICD-10-CM | POA: Diagnosis not present

## 2017-12-11 DIAGNOSIS — J45909 Unspecified asthma, uncomplicated: Secondary | ICD-10-CM | POA: Diagnosis not present

## 2017-12-11 DIAGNOSIS — R51 Headache: Secondary | ICD-10-CM | POA: Diagnosis present

## 2017-12-11 DIAGNOSIS — M25511 Pain in right shoulder: Secondary | ICD-10-CM | POA: Diagnosis not present

## 2017-12-11 DIAGNOSIS — R519 Headache, unspecified: Secondary | ICD-10-CM

## 2017-12-11 DIAGNOSIS — Z87891 Personal history of nicotine dependence: Secondary | ICD-10-CM | POA: Insufficient documentation

## 2017-12-11 DIAGNOSIS — I5032 Chronic diastolic (congestive) heart failure: Secondary | ICD-10-CM | POA: Diagnosis not present

## 2017-12-11 LAB — COMPREHENSIVE METABOLIC PANEL
ALBUMIN: 3.7 g/dL (ref 3.5–5.0)
ALT: 11 U/L (ref 0–44)
ANION GAP: 9 (ref 5–15)
AST: 21 U/L (ref 15–41)
Alkaline Phosphatase: 68 U/L (ref 38–126)
BILIRUBIN TOTAL: 0.7 mg/dL (ref 0.3–1.2)
BUN: 15 mg/dL (ref 8–23)
CHLORIDE: 104 mmol/L (ref 98–111)
CO2: 28 mmol/L (ref 22–32)
Calcium: 9 mg/dL (ref 8.9–10.3)
Creatinine, Ser: 0.98 mg/dL (ref 0.44–1.00)
GFR calc Af Amer: >60 mL/min (ref >60)
GFR calc non Af Amer: 53 mL/min — ABNORMAL LOW (ref >60)
Glucose, Bld: 75 mg/dL (ref 70–99)
POTASSIUM: 3.9 mmol/L (ref 3.5–5.1)
Sodium: 141 mmol/L (ref 135–145)
Total Protein: 7.1 g/dL (ref 6.5–8.1)

## 2017-12-11 LAB — DIFFERENTIAL
ABS IMMATURE GRANULOCYTES: 0 10*3/uL (ref 0.0–0.1)
Basophils Absolute: 0.1 10*3/uL (ref 0.0–0.1)
Basophils Relative: 1 %
EOS PCT: 1 %
Eosinophils Absolute: 0.1 10*3/uL (ref 0.0–0.7)
Immature Granulocytes: 0 %
Lymphocytes Relative: 28 %
Lymphs Abs: 1.8 10*3/uL (ref 0.7–4.0)
MONO ABS: 0.5 10*3/uL (ref 0.1–1.0)
Monocytes Relative: 8 %
NEUTROS ABS: 4.1 10*3/uL (ref 1.7–7.7)
NEUTROS PCT: 62 %

## 2017-12-11 LAB — I-STAT CHEM 8, ED
BUN: 17 mg/dL (ref 8–23)
CHLORIDE: 103 mmol/L (ref 98–111)
Calcium, Ion: 1.15 mmol/L (ref 1.15–1.40)
Creatinine, Ser: 1 mg/dL (ref 0.44–1.00)
GLUCOSE: 80 mg/dL (ref 70–99)
HCT: 34 % — ABNORMAL LOW (ref 36.0–46.0)
Hemoglobin: 11.6 g/dL — ABNORMAL LOW (ref 12.0–15.0)
POTASSIUM: 4.4 mmol/L (ref 3.5–5.1)
Sodium: 142 mmol/L (ref 135–145)
TCO2: 30 mmol/L (ref 22–32)

## 2017-12-11 LAB — I-STAT TROPONIN, ED: Troponin i, poc: 0 ng/mL (ref 0.00–0.08)

## 2017-12-11 LAB — CBC
HCT: 37.2 % (ref 36.0–46.0)
HEMOGLOBIN: 11.2 g/dL — AB (ref 12.0–15.0)
MCH: 27.9 pg (ref 26.0–34.0)
MCHC: 30.1 g/dL (ref 30.0–36.0)
MCV: 92.8 fL (ref 78.0–100.0)
Platelets: 194 10*3/uL (ref 150–400)
RBC: 4.01 MIL/uL (ref 3.87–5.11)
RDW: 15.4 % (ref 11.5–15.5)
WBC: 6.3 10*3/uL (ref 4.0–10.5)

## 2017-12-11 MED ORDER — ACETAMINOPHEN 325 MG PO TABS
650.0000 mg | ORAL_TABLET | Freq: Once | ORAL | Status: AC
  Administered 2017-12-11: 650 mg via ORAL
  Filled 2017-12-11: qty 2

## 2017-12-11 NOTE — ED Notes (Signed)
EDP made aware of patients increasing headache. New orders received.

## 2017-12-11 NOTE — Discharge Instructions (Addendum)
The pain in your head is likely related to muscle tension, associated with your right shoulder pain.  Right shoulder probably has arthritis causing the discomfort.  To treat these discomforts, use heat on the sore area 3 or 4 times a day, and take Tylenol or Motrin for pain.  Make sure that you follow-up with your primary care doctor for checkup in 1 week, and as needed, for problems.

## 2017-12-11 NOTE — ED Triage Notes (Signed)
Patient presents to the ED  From [facility] with complaints of a headache and right sided weakness that started at 0300. Patient reports she was offered Tylenol  but refused but refused because it would not work. Patient has history of deminita and pervious stroke.patient reports has some right- sided weakness from pervious stroke but is worst than baseline.  Patient alert and oriented x4.

## 2017-12-11 NOTE — ED Notes (Signed)
Wentz at bedside providing update

## 2017-12-11 NOTE — ED Notes (Signed)
Patient transported to CT 

## 2017-12-11 NOTE — ED Provider Notes (Signed)
[facility] EMERGENCY DEPARTMENT Provider Note   CSN: [id number] Arrival date & time: 12/11/17  1200     History   Chief Complaint Chief Complaint  Patient presents with  . Headache  . Weakness    HPI Kerri Hernandez is a 82 y.o. female.  HPI   She presents by EMS for evaluation of headache associated with right-sided weakness which she first noticed at 3 AM when she awoke from sleep.  She went to sleep at about 7:30 PM last night.  She did not take anything today, for the headache, but took her usual "vitamins."  She states she ate breakfast but did not eat lunch.  She denies nausea, vomiting, fever, chills, cough, chest pain, palpitations, paresthesia or dizziness.  There are no other known modifying factors.  Past Medical History:  Diagnosis Date  . Arthritis   . Asthma   . Cancer ([place])   . CHF (congestive heart failure) ([place])   . Hypertension   . TIA (transient ischemic attack)     Patient Active Problem List   Diagnosis Date Noted  . Chest pain at rest   . Pharyngeal foreign body 08/09/2017  . Pressure injury of skin 04/30/2017  . Chest pain 04/29/2017  . Hypertension 04/29/2017  . Aortic valve regurgitation   . Dementia without behavioral disturbance 01/04/2017  . Anemia in stage 3 chronic kidney disease ([place]) 11/30/2016  . Chronic diastolic heart failure ([place]) 11/30/2016  . Environmental and seasonal allergies 11/21/2016  . Urinary incontinence 07/18/2016  . Osteoarthritis of multiple joints 07/15/2015  . HLD (hyperlipidemia) 03/04/2013  . Left ventricular hypertrophy 01/18/2013    Past Surgical History:  Procedure Laterality Date  . ABDOMINAL HYSTERECTOMY    . DIRECT LARYNGOSCOPY N/A 08/09/2017   Procedure: DIRECT  laryngoscopy and rigid esophagoscopy;  Surgeon: Melida Quitter, MD;  Location: [facility];  Service: ENT;  Laterality: N/A;  . MASTECTOMY       OB History   None      Home Medications    Prior to Admission medications     Medication Sig Start Date End Date Taking? Authorizing Provider  acetaminophen (TYLENOL) 500 MG tablet Take 2 tablets (1,000 mg total) by mouth every 6 (six) hours as needed. Patient taking differently: Take 1,000 mg by mouth every 6 (six) hours as needed for mild pain, moderate pain, fever or headache.  07/31/17  Yes Charlesetta Shanks, MD  amLODipine (NORVASC) 10 MG tablet Take 1 tablet (10 mg total) daily by mouth. 05/01/17  Yes Burgess Estelle, MD  ARIPiprazole (ABILIFY) 15 MG tablet Take 15 mg by mouth daily. 11/04/17  Yes [provider]  cholecalciferol (VITAMIN D) 1000 units tablet Take 1,000 Units by mouth daily.   Yes [provider]  docusate sodium (COLACE) 100 MG capsule Take 1 capsule (100 mg total) by mouth every 12 (twelve) hours. 09/28/17  Yes Tanna Furry, MD  ferrous sulfate 325 (65 FE) MG tablet Take 325 mg by mouth daily.   Yes [provider]  fluticasone (FLONASE) 50 MCG/ACT nasal spray Place 2 sprays into both nostrils daily.    Yes [provider]  furosemide (LASIX) 20 MG tablet Take 20 mg daily by mouth.   Yes [provider]  hydrALAZINE (APRESOLINE) 10 MG tablet Take 2.5 tablets (25 mg total) by mouth 3 (three) times daily. To use as needed if patient's blood pressure remains greater than 150/80 when taking regular medications as prescribed. Patient taking differently: Take 10 mg  by mouth daily. To use as needed if patient's blood pressure remains greater than 150/80 when taking regular medications as prescribed. 11/30/17  Yes Gherghe, Vella Redhead, MD  loratadine (CLARITIN) 10 MG tablet Take 10 mg by mouth daily.   Yes [provider]  losartan (COZAAR) 100 MG tablet Take 100 mg by mouth daily.   Yes [provider]  nitroGLYCERIN (NITROSTAT) 0.4 MG SL tablet Place 1 tablet (0.4 mg total) under the tongue every 5 (five) minutes as needed for chest pain. For up to 3 consecutive doses *Do not crush* 11/30/17  Yes Gherghe,  Vella Redhead, MD  oxybutynin (DITROPAN) 5 MG tablet Take 2.5 mg by mouth 2 (two) times daily.   Yes [provider]  pantoprazole (PROTONIX) 40 MG tablet Take 40 mg by mouth daily. Do not crush   Yes [provider]  potassium chloride SA (K-DUR,KLOR-CON) 20 MEQ tablet Take 1 tablet (20 mEq total) by mouth 2 (two) times daily. 06/23/08  Yes Delora Fuel, MD  traMADol-acetaminophen (ULTRACET) 37.5-325 MG tablet Take 1 tablet by mouth every 6 (six) hours as needed for moderate pain or severe pain. 11/30/17  Yes Caren Griffins, MD  venlafaxine XR (EFFEXOR-XR) 150 MG 24 hr capsule Take 150 mg by mouth daily with breakfast.   Yes [provider]  VENTOLIN HFA 108 (90 Base) MCG/ACT inhaler Take 2 puffs by mouth every 4 (four) hours as needed for wheezing.  11/15/17  Yes [provider]  vitamin C (ASCORBIC ACID) 500 MG tablet Take 1,000 mg by mouth daily.   Yes [provider]  vitamin E 1000 UNIT capsule Take 1,000 Units by mouth daily.   Yes [provider]  phenazopyridine (PYRIDIUM) 200 MG tablet Take 1 tablet (200 mg total) by mouth 3 (three) times daily. Patient not taking: Reported on 9/60/4540 02/23/10   Delora Fuel, MD    Family History Family History  Problem Relation Age of Onset  . Heart attack Father     Social History Social History   Tobacco Use  . Smoking status: Former Research scientist (life sciences)  . Smokeless tobacco: Never Used  . Tobacco comment: As a teenager.  Substance Use Topics  . Alcohol use: No    Comment: Occasional wine in the past  . Drug use: No     Allergies   Influenza vaccines; Paliperidone; Aspirin; Penicillins; and Sulfa antibiotics   Review of Systems Review of Systems  All other systems reviewed and are negative.    Physical Exam Updated Vital Signs BP (!) 150/73   Pulse (!) 55   Temp 98.7 F (37.1 C) (Oral)   Resp 14   Ht 5' (1.524 m)   Wt 58.1 kg (128 lb)   SpO2 100%   BMI 25.00 kg/m   Physical Exam    Constitutional: She is oriented to person, place, and time. She appears well-developed. She does not appear ill.  Elderly, frail  HENT:  Head: Normocephalic and atraumatic.  Eyes: Pupils are equal, round, and reactive to light. Conjunctivae and EOM are normal.  Neck: Normal range of motion and phonation normal. Neck supple.  Cardiovascular: Normal rate and regular rhythm.  Pulmonary/Chest: Effort normal and breath sounds normal. She exhibits no tenderness.  Abdominal: Soft. She exhibits no distension. There is no tenderness. There is no guarding.  Musculoskeletal: Normal range of motion.  She guards against movement both actively and passively of the right shoulder secondary to pain.  No significant limitation of motion of the  right shoulder.  Strength is 4/5, bilateral, arms and legs.  Neurological: She is alert and oriented to person, place, and time. She exhibits normal muscle tone.  No dysarthria, aphasia or nystagmus.  No pronator drift.  No ataxia.  Skin: Skin is warm and dry.  Psychiatric: She has a normal mood and affect. Her behavior is normal. Judgment and thought content normal.  Nursing note and vitals reviewed.    ED Treatments / Results  Labs (all labs ordered are listed, but only abnormal results are displayed) Labs Reviewed  CBC - Abnormal; Notable for the following components:      Result Value   Hemoglobin 11.2 (*)    All other components within normal limits  COMPREHENSIVE METABOLIC PANEL - Abnormal; Notable for the following components:   GFR calc non Af Amer 53 (*)    All other components within normal limits  I-STAT CHEM 8, ED - Abnormal; Notable for the following components:   Hemoglobin 11.6 (*)    HCT 34.0 (*)    All other components within normal limits  DIFFERENTIAL  I-STAT TROPONIN, ED  CBG MONITORING, ED    EKG EKG Interpretation  Date/Time:  Wednesday December 11 2017 12:02:21 EDT Ventricular Rate:  52 PR Interval:    QRS Duration: 98 QT  Interval:  417 QTC Calculation: 388 R Axis:   65 Text Interpretation:  Sinus or ectopic atrial rhythm LVH with secondary repolarization abnormality since last tracing no significant change Confirmed by Daleen Bo [phone]) on 12/11/2017 4:24:47 PM   Radiology Ct Head Wo Contrast  Result Date: 12/11/2017 CLINICAL DATA:  Headache and right-sided weakness beginning last night. Dementia. EXAM: CT HEAD WITHOUT CONTRAST TECHNIQUE: Contiguous axial images were obtained from the base of the skull through the vertex without intravenous contrast. COMPARISON:  10/26/2017 CT.  MRI 09/26/2010. FINDINGS: Brain: No sign of acute infarction. No focal abnormality seen affecting the brainstem or cerebellum. In comparison with the previous MRI studies, the patient has prominent perivascular spaces at the base of the brain, responsible for areas of low-density in the basal ganglia and external capsule regions. There are mild chronic small-vessel ischemic changes of the white matter. No cortical or large vessel territory infarction. No mass lesion, hemorrhage, hydrocephalus or extra-axial collection. Vascular: There is atherosclerotic calcification of the major vessels at the base of the brain. Skull: Negative Sinuses/Orbits: Clear/normal Other: None IMPRESSION: No acute finding. Areas of low-density in the basal ganglia and external capsules known to represent primarily dilated perivascular spaces based on the previous MR. The patient does have mild chronic small-vessel change of the deep white matter. Electronically Signed   By: Nelson Chimes M.D.   On: 12/11/2017 16:10    Procedures Procedures (including critical care time)  Medications Ordered in ED Medications  acetaminophen (TYLENOL) tablet 650 mg (650 mg Oral Given 12/11/17 1539)     Initial Impression / Assessment and Plan / ED Course  I have reviewed the triage vital signs and the nursing notes.  Pertinent labs & imaging results that were available during  my care of the patient were reviewed by me and considered in my medical decision making (see chart for details).  Clinical Course as of Dec 12 1654  Wed Dec 11, 2017  1638 Normal  Comprehensive metabolic panel(!) [EW]  1700 Normal except hemoglobin low  I-Stat Chem 8, ED(!) [EW]  1638 Normal  I-stat troponin, ED [EW]  1638 Normal  Differential [EW]  1639 Basophils Absolute: 0.1 [EW]  1749  No acute disease, images reviewed  CT Head Wo Contrast [EW]    Clinical Course User Index [EW] Daleen Bo, MD     Patient Vitals for the past 24 hrs:  BP Temp Temp src Pulse Resp SpO2 Height Weight  12/11/17 1645 (!) 150/73 - - (!) 55 14 100 % - -  12/11/17 1615 (!) 162/75 - - (!) 56 14 98 % - -  12/11/17 1545 (!) 171/79 - - (!) 51 18 99 % - -  12/11/17 1538 (!) 162/76 - - (!) 57 (!) 21 100 % - -  12/11/17 1500 (!) 159/84 - - (!) 53 13 99 % - -  12/11/17 1430 (!) 148/75 - - (!) 59 15 100 % - -  12/11/17 1415 (!) 159/81 - - 60 16 100 % - -  12/11/17 1215 (!) 144/85 - - (!) 57 17 98 % - -  12/11/17 1205 (!) 166/78 98.7 F (37.1 C) Oral (!) 54 20 98 % 5' (1.524 m) 58.1 kg (128 lb)    4:37 PM Reevaluation with update and discussion. After initial assessment and treatment, an updated evaluation reveals patient remains comfortable.  Findings discussed with the patient, and all questions. Daleen Bo   Medical Decision Making: Nonspecific headache.  Patient with right shoulder pain on evaluation with likely muscle contraction as source for headache.  Suspect right shoulder arthritis.  Doubt right shoulder fracture.  Doubt serious bacterial infection, pelvic instability or impending vascular collapse.  CRITICAL CARE-no Performed by: Daleen Bo   Nursing Notes Reviewed/ Care Coordinated Applicable Imaging Reviewed Interpretation of Laboratory Data incorporated into ED treatment  The patient appears reasonably screened and/or stabilized for discharge and I doubt any other medical  condition or other [facility] requiring further screening, evaluation, or treatment in the ED at this time prior to discharge.  Plan: Home Medications-continue current medications, and use Tylenol or Motrin for pain.; Home Treatments-rest, heat to affected area; return here if the recommended treatment, does not improve the symptoms; Recommended follow up-PCP follow-up 1 week and as needed.    Final Clinical Impressions(s) / ED Diagnoses   Final diagnoses:  Nonintractable headache, unspecified chronicity pattern, unspecified headache type  Acute pain of right shoulder    ED Discharge Orders    None       Daleen Bo, MD 12/11/17 1656

## 2017-12-23 ENCOUNTER — Emergency Department (HOSPITAL_COMMUNITY): Payer: Medicare Other

## 2017-12-23 ENCOUNTER — Emergency Department (HOSPITAL_COMMUNITY)
Admission: EM | Admit: 2017-12-23 | Discharge: 2017-12-23 | Disposition: A | Discharge status: Home / Self Care | Payer: Medicare Other | Attending: Emergency Medicine | Admitting: Emergency Medicine

## 2017-12-23 ENCOUNTER — Encounter (HOSPITAL_COMMUNITY): Payer: Self-pay | Admitting: Emergency Medicine

## 2017-12-23 DIAGNOSIS — Z87891 Personal history of nicotine dependence: Secondary | ICD-10-CM | POA: Diagnosis not present

## 2017-12-23 DIAGNOSIS — R109 Unspecified abdominal pain: Secondary | ICD-10-CM | POA: Insufficient documentation

## 2017-12-23 DIAGNOSIS — N183 Chronic kidney disease, stage 3 (moderate): Secondary | ICD-10-CM | POA: Insufficient documentation

## 2017-12-23 DIAGNOSIS — I5032 Chronic diastolic (congestive) heart failure: Secondary | ICD-10-CM | POA: Diagnosis not present

## 2017-12-23 DIAGNOSIS — F0390 Unspecified dementia without behavioral disturbance: Secondary | ICD-10-CM | POA: Diagnosis not present

## 2017-12-23 DIAGNOSIS — I130 Hypertensive heart and chronic kidney disease with heart failure and stage 1 through stage 4 chronic kidney disease, or unspecified chronic kidney disease: Secondary | ICD-10-CM | POA: Insufficient documentation

## 2017-12-23 DIAGNOSIS — Z859 Personal history of malignant neoplasm, unspecified: Secondary | ICD-10-CM | POA: Diagnosis not present

## 2017-12-23 DIAGNOSIS — Z79899 Other long term (current) drug therapy: Secondary | ICD-10-CM | POA: Diagnosis not present

## 2017-12-23 DIAGNOSIS — Z9010 Acquired absence of unspecified breast and nipple: Secondary | ICD-10-CM | POA: Insufficient documentation

## 2017-12-23 DIAGNOSIS — R300 Dysuria: Secondary | ICD-10-CM | POA: Diagnosis not present

## 2017-12-23 DIAGNOSIS — J45909 Unspecified asthma, uncomplicated: Secondary | ICD-10-CM | POA: Diagnosis not present

## 2017-12-23 DIAGNOSIS — Z8673 Personal history of transient ischemic attack (TIA), and cerebral infarction without residual deficits: Secondary | ICD-10-CM | POA: Insufficient documentation

## 2017-12-23 LAB — CBC WITH DIFFERENTIAL/PLATELET
BASOS ABS: 0 10*3/uL (ref 0.0–0.1)
BASOS PCT: 1 %
Eosinophils Absolute: 0.1 10*3/uL (ref 0.0–0.7)
Eosinophils Relative: 1 %
HCT: 37.3 % (ref 36.0–46.0)
Hemoglobin: 11.9 g/dL — ABNORMAL LOW (ref 12.0–15.0)
LYMPHS PCT: 30 %
Lymphs Abs: 2.3 10*3/uL (ref 0.7–4.0)
MCH: 28.7 pg (ref 26.0–34.0)
MCHC: 31.9 g/dL (ref 30.0–36.0)
MCV: 89.9 fL (ref 78.0–100.0)
Monocytes Absolute: 0.7 10*3/uL (ref 0.1–1.0)
Monocytes Relative: 9 %
NEUTROS ABS: 4.5 10*3/uL (ref 1.7–7.7)
NEUTROS PCT: 59 %
PLATELETS: 280 10*3/uL (ref 150–400)
RBC: 4.15 MIL/uL (ref 3.87–5.11)
RDW: 15.3 % (ref 11.5–15.5)
WBC: 7.6 10*3/uL (ref 4.0–10.5)

## 2017-12-23 LAB — I-STAT CHEM 8, ED
BUN: 21 mg/dL (ref 8–23)
Calcium, Ion: 1.07 mmol/L — ABNORMAL LOW (ref 1.15–1.40)
Chloride: 101 mmol/L (ref 98–111)
Creatinine, Ser: 1 mg/dL (ref 0.44–1.00)
GLUCOSE: 75 mg/dL (ref 70–99)
HCT: 37 % (ref 36.0–46.0)
HEMOGLOBIN: 12.6 g/dL (ref 12.0–15.0)
POTASSIUM: 4.2 mmol/L (ref 3.5–5.1)
Sodium: 140 mmol/L (ref 135–145)
TCO2: 31 mmol/L (ref 22–32)

## 2017-12-23 LAB — URINALYSIS, ROUTINE W REFLEX MICROSCOPIC
Bilirubin Urine: NEGATIVE
Glucose, UA: NEGATIVE mg/dL
Hgb urine dipstick: NEGATIVE
KETONES UR: NEGATIVE mg/dL
LEUKOCYTES UA: NEGATIVE
NITRITE: NEGATIVE
PROTEIN: NEGATIVE mg/dL
Specific Gravity, Urine: 1.008 (ref 1.005–1.030)
pH: 7 (ref 5.0–8.0)

## 2017-12-23 MED ORDER — PHENAZOPYRIDINE HCL 200 MG PO TABS
200.0000 mg | ORAL_TABLET | Freq: Three times a day (TID) | ORAL | 0 refills | Status: AC | PRN
Start: 2017-12-23 — End: ?

## 2017-12-23 MED ORDER — IOPAMIDOL (ISOVUE-300) INJECTION 61%
INTRAVENOUS | Status: AC
  Filled 2017-12-23: qty 100

## 2017-12-23 NOTE — ED Notes (Signed)
Bed: WA08 Expected date:  Expected time:  Means of arrival:  Comments: EMS-[facility] 

## 2017-12-23 NOTE — ED Provider Notes (Signed)
[facility] DEPT Provider Note   CSN: [id number] Arrival date & time: 12/23/17  1238     History   Chief Complaint Chief Complaint  Patient presents with  . Dysuria    HPI Kerri Hernandez is a 82 y.o. female.  The history is provided by the patient. No language interpreter was used.  Dysuria       82 year old female with history of dementia and recurrent urinary tract infection brought here via EMS from [facility] for c/o dysuria.  Patient reports since yesterday she has had persistent urinary urgency with burning sensation when she urinates.  She states it felt similar to prior urinary tract infection.  Symptom is persistent, moderate in severity.  No report of fever, chills, nausea vomiting diarrhea.  She denies any significant abdominal pain or back pain.  She does wear Depends.  Past Medical History:  Diagnosis Date  . Arthritis   . Asthma   . Cancer ([place])   . CHF (congestive heart failure) ([place])   . Hypertension   . TIA (transient ischemic attack)     Patient Active Problem List   Diagnosis Date Noted  . Chest pain at rest   . Pharyngeal foreign body 08/09/2017  . Pressure injury of skin 04/30/2017  . Chest pain 04/29/2017  . Hypertension 04/29/2017  . Aortic valve regurgitation   . Dementia without behavioral disturbance 01/04/2017  . Anemia in stage 3 chronic kidney disease ([place]) 11/30/2016  . Chronic diastolic heart failure ([place]) 11/30/2016  . Environmental and seasonal allergies 11/21/2016  . Urinary incontinence 07/18/2016  . Osteoarthritis of multiple joints 07/15/2015  . HLD (hyperlipidemia) 03/04/2013  . Left ventricular hypertrophy 01/18/2013    Past Surgical History:  Procedure Laterality Date  . ABDOMINAL HYSTERECTOMY    . DIRECT LARYNGOSCOPY N/A 08/09/2017   Procedure: DIRECT  laryngoscopy and rigid esophagoscopy;  Surgeon: Melida Quitter, MD;  Location: [place];  Service: ENT;  Laterality: N/A;  . MASTECTOMY        OB History   None      Home Medications    Prior to Admission medications   Medication Sig Start Date End Date Taking? Authorizing Provider  acetaminophen (TYLENOL) 500 MG tablet Take 2 tablets (1,000 mg total) by mouth every 6 (six) hours as needed. Patient taking differently: Take 1,000 mg by mouth every 6 (six) hours as needed for mild pain, moderate pain, fever or headache.  07/31/17   Charlesetta Shanks, MD  amLODipine (NORVASC) 10 MG tablet Take 1 tablet (10 mg total) daily by mouth. 05/01/17   Burgess Estelle, MD  ARIPiprazole (ABILIFY) 15 MG tablet Take 15 mg by mouth daily. 11/04/17   [provider]  cholecalciferol (VITAMIN D) 1000 units tablet Take 1,000 Units by mouth daily.    [provider]  docusate sodium (COLACE) 100 MG capsule Take 1 capsule (100 mg total) by mouth every 12 (twelve) hours. 09/28/17   Tanna Furry, MD  ferrous sulfate 325 (65 FE) MG tablet Take 325 mg by mouth daily.    [provider]  fluticasone (FLONASE) 50 MCG/ACT nasal spray Place 2 sprays into both nostrils daily.     [provider]  furosemide (LASIX) 20 MG tablet Take 20 mg daily by mouth.    [provider]  hydrALAZINE (APRESOLINE) 10 MG tablet Take 2.5 tablets (25 mg total) by mouth 3 (three) times daily. To use as needed if patient's blood pressure remains greater than 150/80 when taking  regular medications as prescribed. Patient taking differently: Take 10 mg by mouth daily. To use as needed if patient's blood pressure remains greater than 150/80 when taking regular medications as prescribed. 11/30/17   Caren Griffins, MD  loratadine (CLARITIN) 10 MG tablet Take 10 mg by mouth daily.    [provider]  losartan (COZAAR) 100 MG tablet Take 100 mg by mouth daily.    [provider]  nitroGLYCERIN (NITROSTAT) 0.4 MG SL tablet Place 1 tablet (0.4 mg total) under the tongue every 5 (five) minutes as needed for chest pain. For up to 3  consecutive doses *Do not crush* 11/30/17   Caren Griffins, MD  oxybutynin (DITROPAN) 5 MG tablet Take 2.5 mg by mouth 2 (two) times daily.    [provider]  pantoprazole (PROTONIX) 40 MG tablet Take 40 mg by mouth daily. Do not crush    [provider]  phenazopyridine (PYRIDIUM) 200 MG tablet Take 1 tablet (200 mg total) by mouth 3 (three) times daily. Patient not taking: Reported on 1/61/0960 09/21/38   Delora Fuel, MD  potassium chloride SA (K-DUR,KLOR-CON) 20 MEQ tablet Take 1 tablet (20 mEq total) by mouth 2 (two) times daily. 02/23/10   Delora Fuel, MD  traMADol-acetaminophen (ULTRACET) 37.5-325 MG tablet Take 1 tablet by mouth every 6 (six) hours as needed for moderate pain or severe pain. 11/30/17   Caren Griffins, MD  venlafaxine XR (EFFEXOR-XR) 150 MG 24 hr capsule Take 150 mg by mouth daily with breakfast.    [provider]  VENTOLIN HFA 108 (90 Base) MCG/ACT inhaler Take 2 puffs by mouth every 4 (four) hours as needed for wheezing.  11/15/17   [provider]  vitamin C (ASCORBIC ACID) 500 MG tablet Take 1,000 mg by mouth daily.    [provider]  vitamin E 1000 UNIT capsule Take 1,000 Units by mouth daily.    [provider]    Family History Family History  Problem Relation Age of Onset  . Heart attack Father     Social History Social History   Tobacco Use  . Smoking status: Former Research scientist (life sciences)  . Smokeless tobacco: Never Used  . Tobacco comment: As a teenager.  Substance Use Topics  . Alcohol use: No    Comment: Occasional wine in the past  . Drug use: No     Allergies   Influenza vaccines; Paliperidone; Aspirin; Penicillins; and Sulfa antibiotics   Review of Systems Review of Systems  Genitourinary: Positive for dysuria.  All other systems reviewed and are negative.    Physical Exam Updated Vital Signs BP (!) 173/85 (BP Location: Left Arm)   Pulse 62   Temp 97.9 F (36.6 C) (Oral)   Resp 15   SpO2  100%   Physical Exam  Constitutional: She is oriented to person, place, and time. She appears well-developed and well-nourished. No distress.  HENT:  Head: Atraumatic.  Eyes: Conjunctivae are normal.  Neck: Neck supple.  Cardiovascular: Normal rate and regular rhythm.  Pulmonary/Chest: Effort normal and breath sounds normal.  Abdominal: Bowel sounds are normal. She exhibits no distension. There is no tenderness.  Neurological: She is alert and oriented to person, place, and time.  Skin: No rash noted.  Psychiatric: She has a normal mood and affect.  Nursing note and vitals reviewed.    ED Treatments / Results  Labs (all labs ordered are listed, but only abnormal results are displayed) Labs Reviewed  CBC WITH DIFFERENTIAL/PLATELET -  Abnormal; Notable for the following components:      Result Value   Hemoglobin 11.9 (*)    All other components within normal limits  URINALYSIS, ROUTINE W REFLEX MICROSCOPIC - Abnormal; Notable for the following components:   Color, Urine STRAW (*)    All other components within normal limits  I-STAT CHEM 8, ED - Abnormal; Notable for the following components:   Calcium, Ion 1.07 (*)    All other components within normal limits  URINE CULTURE    EKG None  Radiology Ct Abdomen Pelvis Wo Contrast  Result Date: 12/23/2017 CLINICAL DATA:  Dysuria. EXAM: CT ABDOMEN AND PELVIS WITHOUT CONTRAST TECHNIQUE: Multidetector CT imaging of the abdomen and pelvis was performed following the standard protocol without IV contrast. COMPARISON:  09/28/2017 FINDINGS: Lower chest: The lung bases are clear. The heart is mildly enlarged. No pericardial effusion. Moderate tortuosity, mild calcification mild ectasia of the distal descending thoracic aorta. Hepatobiliary: No focal hepatic lesions are identified without contrast. The gallbladder is grossly normal. Pancreas: Moderate atrophy.  No obvious mass or inflammation. Spleen: Normal size.  No focal lesions.  Adrenals/Urinary Tract: The adrenal glands and kidneys are unremarkable. No renal, ureteral or bladder calculi or obvious mass. Stomach/Bowel: The stomach, duodenum, small bowel and colon are grossly normal without oral contrast. No obvious acute inflammatory changes, mass lesions or obstructive findings. Moderate stool throughout the colon and down into the rectum may suggest constipation. Vascular/Lymphatic: Moderate atherosclerotic calcifications involving the aorta iliac arteries. No obvious mesenteric or retroperitoneal mass or adenopathy. Reproductive: Surgically absent. Other: No free pelvic fluid collections. No free intraperitoneal air. Musculoskeletal: No significant bony findings. Stable degenerative disease involving the spine. IMPRESSION: No obvious acute abdominal/pelvic findings involving the abdomen/pelvis without IV or oral contrast. No renal, ureteral or bladder calculi. Electronically Signed   By: Marijo Sanes M.D.   On: 12/23/2017 16:24    Procedures Procedures (including critical care time)  Medications Ordered in ED Medications  iopamidol (ISOVUE-300) 61 % injection (has no administration in time range)     Initial Impression / Assessment and Plan / ED Course  I have reviewed the triage vital signs and the nursing notes.  Pertinent labs & imaging results that were available during my care of the patient were reviewed by me and considered in my medical decision making (see chart for details).     BP (!) 164/79 (BP Location: Left Arm)   Pulse 61   Temp 97.9 F (36.6 C) (Oral)   Resp 14   SpO2 100%    Final Clinical Impressions(s) / ED Diagnoses   Final diagnoses:  Dysuria    ED Discharge Orders        Ordered    phenazopyridine (PYRIDIUM) 200 MG tablet  3 times daily PRN     12/23/17 1653     1:28 PM Elderly female here with complaint of dysuria.  No other complaint.  She is afebrile, she is mildly hypertensive with blood pressure of 173/85 but otherwise  no concerning feature.  Her abdomen is soft and nontender.  She does wear adult briefs which increases the risk of urinary tract infection.  3:16 PM Urine without signs of urinary tract infection.  Labs are reassuring however Dr. Tamera Punt did evaluate pt and she does have mild L side abd pain.  Will obtain abd/pelvis CT for further evaluation of her condition.   4:47 PM Patient did not want to drink any p.o. contrast.  An abdominal and pelvis CT scan  without contrast show no obvious acute abdominal pelvic finding.  Evidence of mild constipation were noted.  Since her labs are reassuring, no significant concerning changes on CT scan, patient denies feeling constipated.  Therefore, encourage pt to f/u with PCP for further care.  Return precaution discussed. Will prescribe pyridium for dysuria sxs.      Domenic Moras, PA-C 12/23/17 1654    Malvin Johns, MD 12/25/17 1150

## 2017-12-23 NOTE — ED Triage Notes (Signed)
Per EMS, patient from [facility], c/o dysuria since this morning. Hx UTI and dementia. A&Ox4.

## 2017-12-23 NOTE — Discharge Instructions (Addendum)
Please take pyridium as needed for your urinary discomfort.  Your urine did not show any evidence of UTI.  Your abdominal and Pelvis CT scan is without concerning features.

## 2017-12-23 NOTE — ED Notes (Signed)
Bed: [facility] Expected date:  Expected time:  Means of arrival:  Comments: 

## 2017-12-23 NOTE — ED Notes (Signed)
Purewick applied.

## 2017-12-24 ENCOUNTER — Emergency Department (HOSPITAL_COMMUNITY)
Admission: EM | Admit: 2017-12-24 | Discharge: 2017-12-24 | Disposition: A | Discharge status: Home / Self Care | Payer: Medicare Other | Attending: Emergency Medicine | Admitting: Emergency Medicine

## 2017-12-24 ENCOUNTER — Other Ambulatory Visit: Payer: Self-pay

## 2017-12-24 ENCOUNTER — Encounter (HOSPITAL_COMMUNITY): Payer: Self-pay | Admitting: Emergency Medicine

## 2017-12-24 DIAGNOSIS — I110 Hypertensive heart disease with heart failure: Secondary | ICD-10-CM | POA: Insufficient documentation

## 2017-12-24 DIAGNOSIS — Z87891 Personal history of nicotine dependence: Secondary | ICD-10-CM | POA: Insufficient documentation

## 2017-12-24 DIAGNOSIS — Z79899 Other long term (current) drug therapy: Secondary | ICD-10-CM | POA: Insufficient documentation

## 2017-12-24 DIAGNOSIS — I5032 Chronic diastolic (congestive) heart failure: Secondary | ICD-10-CM | POA: Insufficient documentation

## 2017-12-24 DIAGNOSIS — M25562 Pain in left knee: Secondary | ICD-10-CM | POA: Diagnosis present

## 2017-12-24 DIAGNOSIS — F0390 Unspecified dementia without behavioral disturbance: Secondary | ICD-10-CM | POA: Diagnosis not present

## 2017-12-24 DIAGNOSIS — G8929 Other chronic pain: Secondary | ICD-10-CM | POA: Diagnosis not present

## 2017-12-24 DIAGNOSIS — M170 Bilateral primary osteoarthritis of knee: Secondary | ICD-10-CM | POA: Insufficient documentation

## 2017-12-24 DIAGNOSIS — M25569 Pain in unspecified knee: Secondary | ICD-10-CM

## 2017-12-24 DIAGNOSIS — J45909 Unspecified asthma, uncomplicated: Secondary | ICD-10-CM | POA: Insufficient documentation

## 2017-12-24 LAB — URINE CULTURE: Culture: NO GROWTH

## 2017-12-24 MED ORDER — LIDOCAINE 5 % EX PTCH
2.0000 | MEDICATED_PATCH | CUTANEOUS | Status: DC
  Administered 2017-12-24: 2 via TRANSDERMAL
  Filled 2017-12-24: qty 2

## 2017-12-24 MED ORDER — LIDOCAINE 5 % EX PTCH
1.0000 | MEDICATED_PATCH | CUTANEOUS | Status: DC
  Administered 2017-12-24: 1 via TRANSDERMAL
  Filled 2017-12-24: qty 1

## 2017-12-24 MED ORDER — LIDOCAINE 5 % EX PTCH: 1.0000 | MEDICATED_PATCH | CUTANEOUS | 0 refills | Status: AC

## 2017-12-24 NOTE — ED Notes (Signed)
Bed: [facility] Expected date:  Expected time:  Means of arrival:  Comments: EMS 82 yo female from SNF-bilateral lower leg pain

## 2017-12-24 NOTE — ED Provider Notes (Signed)
[facility] DEPT Provider Note   CSN: [id number] Arrival date & time: 12/24/17  0251     History   Chief Complaint Chief Complaint  Patient presents with  . Leg Pain    HPI Kerri Hernandez is a 82 y.o. female.  The history is provided by the patient.  Leg Pain   This is a chronic problem. The current episode started more than 1 week ago. The problem occurs constantly. The problem has not changed since onset.Pain location: B knees was seen here earlier and her arthritis has not resolved.    Chronic arthritis, seen here earlier and only got tylenol for her ongoing osteoarthritis.  No f/c/r.  No swelling.  No surgeries.  No car trips or plane trips.    Past Medical History:  Diagnosis Date  . Arthritis   . Asthma   . Cancer ([place])   . CHF (congestive heart failure) ([place])   . Hypertension   . TIA (transient ischemic attack)     Patient Active Problem List   Diagnosis Date Noted  . Chest pain at rest   . Pharyngeal foreign body 08/09/2017  . Pressure injury of skin 04/30/2017  . Chest pain 04/29/2017  . Hypertension 04/29/2017  . Aortic valve regurgitation   . Dementia without behavioral disturbance 01/04/2017  . Anemia in stage 3 chronic kidney disease ([place]) 11/30/2016  . Chronic diastolic heart failure ([facility]) 11/30/2016  . Environmental and seasonal allergies 11/21/2016  . Urinary incontinence 07/18/2016  . Osteoarthritis of multiple joints 07/15/2015  . HLD (hyperlipidemia) 03/04/2013  . Left ventricular hypertrophy 01/18/2013    Past Surgical History:  Procedure Laterality Date  . ABDOMINAL HYSTERECTOMY    . DIRECT LARYNGOSCOPY N/A 08/09/2017   Procedure: DIRECT  laryngoscopy and rigid esophagoscopy;  Surgeon: Melida Quitter, MD;  Location: [facility];  Service: ENT;  Laterality: N/A;  . MASTECTOMY       OB History   None      Home Medications    Prior to Admission medications   Medication Sig Start Date End Date Taking?  Authorizing Provider  acetaminophen (TYLENOL) 500 MG tablet Take 2 tablets (1,000 mg total) by mouth every 6 (six) hours as needed. Patient taking differently: Take 1,000 mg by mouth every 6 (six) hours as needed for mild pain, moderate pain, fever or headache.  07/31/17   Charlesetta Shanks, MD  amLODipine (NORVASC) 10 MG tablet Take 1 tablet (10 mg total) daily by mouth. 05/01/17   Burgess Estelle, MD  ARIPiprazole (ABILIFY) 15 MG tablet Take 15 mg by mouth daily. 11/04/17   [provider]  Ascorbic Acid (VITAMIN C) 1000 MG tablet Take 1,000 mg by mouth daily.    [provider]  cholecalciferol (VITAMIN D) 1000 units tablet Take 1,000 Units by mouth daily.    [provider]  docusate sodium (COLACE) 100 MG capsule Take 1 capsule (100 mg total) by mouth every 12 (twelve) hours. 09/28/17   Tanna Furry, MD  ferrous sulfate 325 (65 FE) MG tablet Take 325 mg by mouth daily.    [provider]  fluticasone (FLONASE) 50 MCG/ACT nasal spray Place 2 sprays into both nostrils daily.     [provider]  furosemide (LASIX) 20 MG tablet Take 20 mg daily by mouth.    [provider]  hydrALAZINE (APRESOLINE) 10 MG tablet Take 2.5 tablets (25 mg total) by mouth 3 (three) times daily. To use as needed if patient's blood pressure remains greater  than 150/80 when taking regular medications as prescribed. Patient taking differently: Take 10 mg by mouth daily. To use as needed if patient's blood pressure remains greater than 150/80 when taking regular medications as prescribed. 11/30/17   Caren Griffins, MD  loratadine (CLARITIN) 10 MG tablet Take 10 mg by mouth daily.    [provider]  losartan (COZAAR) 100 MG tablet Take 100 mg by mouth daily.    [provider]  nitroGLYCERIN (NITROSTAT) 0.4 MG SL tablet Place 1 tablet (0.4 mg total) under the tongue every 5 (five) minutes as needed for chest pain. For up to 3 consecutive doses *Do not crush*  11/30/17   Caren Griffins, MD  oxybutynin (DITROPAN) 5 MG tablet Take 2.5 mg by mouth 2 (two) times daily.    [provider]  pantoprazole (PROTONIX) 40 MG tablet Take 40 mg by mouth daily. Do not crush    [provider]  phenazopyridine (PYRIDIUM) 200 MG tablet Take 1 tablet (200 mg total) by mouth 3 (three) times daily as needed for pain (urinary discomfort). 12/23/17   Domenic Moras, PA-C  potassium chloride SA (K-DUR,KLOR-CON) 20 MEQ tablet Take 1 tablet (20 mEq total) by mouth 2 (two) times daily. 07/28/91   Delora Fuel, MD  traMADol-acetaminophen (ULTRACET) 37.5-325 MG tablet Take 1 tablet by mouth every 6 (six) hours as needed for moderate pain or severe pain. 11/30/17   Caren Griffins, MD  venlafaxine XR (EFFEXOR-XR) 150 MG 24 hr capsule Take 150 mg by mouth daily with breakfast.    [provider]  VENTOLIN HFA 108 (90 Base) MCG/ACT inhaler Take 2 puffs by mouth every 4 (four) hours as needed for wheezing.  11/15/17   [provider]  vitamin E 1000 UNIT capsule Take 1,000 Units by mouth daily.    [provider]    Family History Family History  Problem Relation Age of Onset  . Heart attack Father     Social History Social History   Tobacco Use  . Smoking status: Former Research scientist (life sciences)  . Smokeless tobacco: Never Used  . Tobacco comment: As a teenager.  Substance Use Topics  . Alcohol use: No    Comment: Occasional wine in the past  . Drug use: No     Allergies   Influenza vaccines; Paliperidone; Aspirin; Penicillins; and Sulfa antibiotics   Review of Systems Review of Systems  Constitutional: Negative for fever.  Respiratory: Negative for shortness of breath.   Cardiovascular: Negative for chest pain and palpitations.  Musculoskeletal: Positive for arthralgias. Negative for gait problem and joint swelling.  All other systems reviewed and are negative.    Physical Exam Updated Vital Signs BP 134/72 (BP Location: Left Arm)    Pulse (!) 58   Temp 97.8 F (36.6 C) (Oral)   Resp 14   SpO2 100%   Physical Exam  Constitutional: She appears well-developed and well-nourished.  HENT:  Head: Normocephalic and atraumatic.  Mouth/Throat: No oropharyngeal exudate.  Eyes: Pupils are equal, round, and reactive to light. Conjunctivae are normal.  Neck: Normal range of motion. Neck supple. No JVD present.  Cardiovascular: Normal rate, regular rhythm, normal heart sounds and intact distal pulses.  Pulmonary/Chest: Effort normal and breath sounds normal. No stridor. She has no wheezes. She has no rales.  Abdominal: Soft. Bowel sounds are normal. She exhibits no mass. There is no tenderness. There is no guarding.  Musculoskeletal: Normal range of motion. She exhibits no edema, tenderness or deformity.  Neurological: She is alert. She displays normal reflexes.  Skin: Skin is warm and dry.  Psychiatric: She has a normal mood and affect.     ED Treatments / Results  Labs (all labs ordered are listed, but only abnormal results are displayed) Labs Reviewed - No data to display  EKG None  Radiology Ct Abdomen Pelvis Wo Contrast  Result Date: 12/23/2017 CLINICAL DATA:  Dysuria. EXAM: CT ABDOMEN AND PELVIS WITHOUT CONTRAST TECHNIQUE: Multidetector CT imaging of the abdomen and pelvis was performed following the standard protocol without IV contrast. COMPARISON:  09/28/2017 FINDINGS: Lower chest: The lung bases are clear. The heart is mildly enlarged. No pericardial effusion. Moderate tortuosity, mild calcification mild ectasia of the distal descending thoracic aorta. Hepatobiliary: No focal hepatic lesions are identified without contrast. The gallbladder is grossly normal. Pancreas: Moderate atrophy.  No obvious mass or inflammation. Spleen: Normal size.  No focal lesions. Adrenals/Urinary Tract: The adrenal glands and kidneys are unremarkable. No renal, ureteral or bladder calculi or obvious mass. Stomach/Bowel: The stomach,  duodenum, small bowel and colon are grossly normal without oral contrast. No obvious acute inflammatory changes, mass lesions or obstructive findings. Moderate stool throughout the colon and down into the rectum may suggest constipation. Vascular/Lymphatic: Moderate atherosclerotic calcifications involving the aorta iliac arteries. No obvious mesenteric or retroperitoneal mass or adenopathy. Reproductive: Surgically absent. Other: No free pelvic fluid collections. No free intraperitoneal air. Musculoskeletal: No significant bony findings. Stable degenerative disease involving the spine. IMPRESSION: No obvious acute abdominal/pelvic findings involving the abdomen/pelvis without IV or oral contrast. No renal, ureteral or bladder calculi. Electronically Signed   By: Marijo Sanes M.D.   On: 12/23/2017 16:24    Procedures Procedures (including critical care time)  Medications Ordered in ED Medications  lidocaine (LIDODERM) 5 % 2 patch (has no administration in time range)      Final Clinical Impressions(s) / ED Diagnoses   Osteoarthritis, stable for discharge.     Return for pain, numbness, changes in vision or speech, fevers >100.4 unrelieved by medication, shortness of breath, intractable vomiting, or diarrhea, abdominal pain, Inability to tolerate liquids or food, cough, altered mental status or any concerns. No signs of systemic illness or infection. The patient is nontoxic-appearing on exam and vital signs are within normal limits. Will refer to urology for microscopy hematuria as patient is asymptomatic.  I have reviewed the triage vital signs and the nursing notes. Pertinent labs &imaging results that were available during my care of the patient were reviewed by me and considered in my medical decision making (see chart for details).  After history, exam, and medical workup I feel the patient has been appropriately medically screened and is safe for discharge home. Pertinent  diagnoses were discussed with the patient. Patient was given return precautions.     Lucian Baswell, MD 12/24/17 1610

## 2017-12-24 NOTE — ED Triage Notes (Signed)
Pt was seen at this ED earlier today for UTI. Pt has returned because leg pain was not addressed while here earlier. Per EMS personnel, pt slept in the truck on the way to this facility.

## 2017-12-26 ENCOUNTER — Telehealth: Payer: Self-pay | Admitting: *Deleted

## 2017-12-26 ENCOUNTER — Ambulatory Visit: Payer: Self-pay | Admitting: Neurology

## 2017-12-26 NOTE — Telephone Encounter (Signed)
No showed new patient appointment. 

## 2017-12-30 ENCOUNTER — Encounter: Payer: Self-pay | Admitting: Neurology

## 2018-01-01 ENCOUNTER — Encounter (HOSPITAL_COMMUNITY): Payer: Self-pay | Admitting: Emergency Medicine

## 2018-01-01 ENCOUNTER — Emergency Department (HOSPITAL_COMMUNITY): Payer: Medicare Other

## 2018-01-01 ENCOUNTER — Other Ambulatory Visit: Payer: Self-pay

## 2018-01-01 ENCOUNTER — Emergency Department (HOSPITAL_COMMUNITY)
Admission: EM | Admit: 2018-01-01 | Discharge: 2018-01-01 | Disposition: A | Discharge status: Home / Self Care | Payer: Medicare Other | Attending: Emergency Medicine | Admitting: Emergency Medicine

## 2018-01-01 DIAGNOSIS — I5032 Chronic diastolic (congestive) heart failure: Secondary | ICD-10-CM | POA: Diagnosis not present

## 2018-01-01 DIAGNOSIS — R079 Chest pain, unspecified: Secondary | ICD-10-CM | POA: Diagnosis present

## 2018-01-01 DIAGNOSIS — Z79899 Other long term (current) drug therapy: Secondary | ICD-10-CM | POA: Diagnosis not present

## 2018-01-01 DIAGNOSIS — Z8673 Personal history of transient ischemic attack (TIA), and cerebral infarction without residual deficits: Secondary | ICD-10-CM | POA: Diagnosis not present

## 2018-01-01 DIAGNOSIS — J45909 Unspecified asthma, uncomplicated: Secondary | ICD-10-CM | POA: Insufficient documentation

## 2018-01-01 DIAGNOSIS — E876 Hypokalemia: Secondary | ICD-10-CM | POA: Diagnosis not present

## 2018-01-01 DIAGNOSIS — F0390 Unspecified dementia without behavioral disturbance: Secondary | ICD-10-CM | POA: Diagnosis not present

## 2018-01-01 DIAGNOSIS — R142 Eructation: Secondary | ICD-10-CM | POA: Diagnosis not present

## 2018-01-01 DIAGNOSIS — Z853 Personal history of malignant neoplasm of breast: Secondary | ICD-10-CM | POA: Diagnosis not present

## 2018-01-01 DIAGNOSIS — I130 Hypertensive heart and chronic kidney disease with heart failure and stage 1 through stage 4 chronic kidney disease, or unspecified chronic kidney disease: Secondary | ICD-10-CM | POA: Insufficient documentation

## 2018-01-01 DIAGNOSIS — N183 Chronic kidney disease, stage 3 (moderate): Secondary | ICD-10-CM | POA: Diagnosis not present

## 2018-01-01 DIAGNOSIS — R0602 Shortness of breath: Secondary | ICD-10-CM | POA: Diagnosis not present

## 2018-01-01 LAB — BASIC METABOLIC PANEL
Anion gap: 9 (ref 5–15)
BUN: 13 mg/dL (ref 8–23)
CALCIUM: 9 mg/dL (ref 8.9–10.3)
CO2: 30 mmol/L (ref 22–32)
CREATININE: 1.04 mg/dL — AB (ref 0.44–1.00)
Chloride: 102 mmol/L (ref 98–111)
GFR calc non Af Amer: 50 mL/min — ABNORMAL LOW (ref >60)
GFR, EST AFRICAN AMERICAN: 58 mL/min — AB (ref >60)
Glucose, Bld: 96 mg/dL (ref 70–99)
Potassium: 3 mmol/L — ABNORMAL LOW (ref 3.5–5.1)
SODIUM: 141 mmol/L (ref 135–145)

## 2018-01-01 LAB — CBC
HCT: 34.9 % — ABNORMAL LOW (ref 36.0–46.0)
Hemoglobin: 11 g/dL — ABNORMAL LOW (ref 12.0–15.0)
MCH: 28.5 pg (ref 26.0–34.0)
MCHC: 31.5 g/dL (ref 30.0–36.0)
MCV: 90.4 fL (ref 78.0–100.0)
Platelets: 243 10*3/uL (ref 150–400)
RBC: 3.86 MIL/uL — AB (ref 3.87–5.11)
RDW: 14.5 % (ref 11.5–15.5)
WBC: 6 10*3/uL (ref 4.0–10.5)

## 2018-01-01 LAB — I-STAT TROPONIN, ED
TROPONIN I, POC: 0 ng/mL (ref 0.00–0.08)
TROPONIN I, POC: 0 ng/mL (ref 0.00–0.08)

## 2018-01-01 MED ORDER — POTASSIUM CHLORIDE CRYS ER 20 MEQ PO TBCR
40.0000 meq | EXTENDED_RELEASE_TABLET | Freq: Once | ORAL | Status: AC
Start: 2018-01-01 — End: 2018-01-01
  Administered 2018-01-01: 40 meq via ORAL
  Filled 2018-01-01: qty 2

## 2018-01-01 MED ORDER — GI COCKTAIL ~~LOC~~
30.0000 mL | Freq: Once | ORAL | Status: AC
  Administered 2018-01-01: 30 mL via ORAL
  Filled 2018-01-01: qty 30

## 2018-01-01 NOTE — Progress Notes (Signed)
CSW consulted to speak with pt at bedside. CSW spoke with pt at bedside and was informed that pt is from [facility] and is planning to return. Pt expressed that pt is going to [place] with daughter next week as a result of several concerns with the facility. Pt appeared to be in no distress and agreeable to return to [facility]/ No further CSW needs. CSW will sign off.   Virgie Dad. Jariah Jarmon, MSW, [facility] Clinical Social Worker [phone]

## 2018-01-01 NOTE — ED Notes (Signed)
Patient transported to X-ray 

## 2018-01-01 NOTE — ED Provider Notes (Signed)
[facility] EMERGENCY DEPARTMENT Provider Note   CSN: [id number] Arrival date & time: 01/01/18  0246     History   Chief Complaint Chief Complaint  Patient presents with  . Chest Pain    HPI Kerri Hernandez is a 82 y.o. female.  The history is provided by the patient and medical records.  Chest Pain       82 year old female with history of arthritis, asthma, breast cancer status post ectomy, CHF, hypertension, presenting to the ED with chest pain.  Patient reports pain began late last night a few hours after she ate dinner.  States pain was worse when laying down, better with sitting up.  States she did notice quite a lot of burping that seemed to help reduce her symptoms but never fully went away.  States her chest now feels "sore".  States she had some transient shortness of breath but that is resolved at this time.  She denies any palpitations, dizziness, diaphoresis, nausea, vomiting, abdominal pain, weakness, or feelings of syncope.  She has history of aortic regurgitation, not currently followed by cardiology.  She does have some family cardiac history.  She is a former smoker, quit when she was a teenager.  Was given aspirin and nitroglycerin with EMS, refused further nitro as it gave her headache.  Past Medical History:  Diagnosis Date  . Arthritis   . Asthma   . Cancer ([place])   . CHF (congestive heart failure) ([place])   . Hypertension   . TIA (transient ischemic attack)     Patient Active Problem List   Diagnosis Date Noted  . Chest pain at rest   . Pharyngeal foreign body 08/09/2017  . Pressure injury of skin 04/30/2017  . Chest pain 04/29/2017  . Hypertension 04/29/2017  . Aortic valve regurgitation   . Dementia without behavioral disturbance 01/04/2017  . Anemia in stage 3 chronic kidney disease ([place]) 11/30/2016  . Chronic diastolic heart failure ([place]) 11/30/2016  . Environmental and seasonal allergies 11/21/2016  . Urinary incontinence  07/18/2016  . Osteoarthritis of multiple joints 07/15/2015  . HLD (hyperlipidemia) 03/04/2013  . Left ventricular hypertrophy 01/18/2013    Past Surgical History:  Procedure Laterality Date  . ABDOMINAL HYSTERECTOMY    . DIRECT LARYNGOSCOPY N/A 08/09/2017   Procedure: DIRECT  laryngoscopy and rigid esophagoscopy;  Surgeon: Melida Quitter, MD;  Location: [facility];  Service: ENT;  Laterality: N/A;  . MASTECTOMY       OB History   None      Home Medications    Prior to Admission medications   Medication Sig Start Date End Date Taking? Authorizing Provider  acetaminophen (TYLENOL) 500 MG tablet Take 2 tablets (1,000 mg total) by mouth every 6 (six) hours as needed. Patient taking differently: Take 1,000 mg by mouth every 6 (six) hours as needed for mild pain, moderate pain, fever or headache.  07/31/17   Charlesetta Shanks, MD  amLODipine (NORVASC) 10 MG tablet Take 1 tablet (10 mg total) daily by mouth. 05/01/17   Burgess Estelle, MD  ARIPiprazole (ABILIFY) 15 MG tablet Take 15 mg by mouth daily. 11/04/17   [provider]  Ascorbic Acid (VITAMIN C) 1000 MG tablet Take 1,000 mg by mouth daily.    [provider]  cholecalciferol (VITAMIN D) 1000 units tablet Take 1,000 Units by mouth daily.    [provider]  docusate sodium (COLACE) 100 MG capsule Take 1 capsule (100 mg total) by mouth every 12 (twelve) hours.  09/28/17   Tanna Furry, MD  ferrous sulfate 325 (65 FE) MG tablet Take 325 mg by mouth daily.    [provider]  fluticasone (FLONASE) 50 MCG/ACT nasal spray Place 2 sprays into both nostrils daily.     [provider]  furosemide (LASIX) 20 MG tablet Take 20 mg daily by mouth.    [provider]  hydrALAZINE (APRESOLINE) 10 MG tablet Take 2.5 tablets (25 mg total) by mouth 3 (three) times daily. To use as needed if patient's blood pressure remains greater than 150/80 when taking regular medications as prescribed. Patient taking  differently: Take 10 mg by mouth daily. To use as needed if patient's blood pressure remains greater than 150/80 when taking regular medications as prescribed. 11/30/17   Caren Griffins, MD  lidocaine (LIDODERM) 5 % Place 1 patch onto the skin daily. Remove & Discard patch within 12 hours or as directed by MD 12/24/17   Randal Buba, April, MD  loratadine (CLARITIN) 10 MG tablet Take 10 mg by mouth daily.    [provider]  losartan (COZAAR) 100 MG tablet Take 100 mg by mouth daily.    [provider]  nitroGLYCERIN (NITROSTAT) 0.4 MG SL tablet Place 1 tablet (0.4 mg total) under the tongue every 5 (five) minutes as needed for chest pain. For up to 3 consecutive doses *Do not crush* 11/30/17   Caren Griffins, MD  oxybutynin (DITROPAN) 5 MG tablet Take 2.5 mg by mouth 2 (two) times daily.    [provider]  pantoprazole (PROTONIX) 40 MG tablet Take 40 mg by mouth daily. Do not crush    [provider]  phenazopyridine (PYRIDIUM) 200 MG tablet Take 1 tablet (200 mg total) by mouth 3 (three) times daily as needed for pain (urinary discomfort). 12/23/17   Domenic Moras, PA-C  potassium chloride SA (K-DUR,KLOR-CON) 20 MEQ tablet Take 1 tablet (20 mEq total) by mouth 2 (two) times daily. 10/19/27   Delora Fuel, MD  traMADol-acetaminophen (ULTRACET) 37.5-325 MG tablet Take 1 tablet by mouth every 6 (six) hours as needed for moderate pain or severe pain. 11/30/17   Caren Griffins, MD  venlafaxine XR (EFFEXOR-XR) 150 MG 24 hr capsule Take 150 mg by mouth daily with breakfast.    [provider]  VENTOLIN HFA 108 (90 Base) MCG/ACT inhaler Take 2 puffs by mouth every 4 (four) hours as needed for wheezing.  11/15/17   [provider]  vitamin E 1000 UNIT capsule Take 1,000 Units by mouth daily.    [provider]    Family History Family History  Problem Relation Age of Onset  . Heart attack Father     Social History Social History   Tobacco Use    . Smoking status: Former Research scientist (life sciences)  . Smokeless tobacco: Never Used  . Tobacco comment: As a teenager.  Substance Use Topics  . Alcohol use: No    Comment: Occasional wine in the past  . Drug use: No     Allergies   Influenza vaccines; Paliperidone; Aspirin; Penicillins; and Sulfa antibiotics   Review of Systems Review of Systems  Cardiovascular: Positive for chest pain.  All other systems reviewed and are negative.    Physical Exam Updated Vital Signs BP (!) 157/77   Pulse 60   Resp 14   Ht 5' (1.524 m)   Wt 54.4 kg (120 lb)   SpO2 100%   BMI 23.44 kg/m   Physical Exam  Constitutional: She is  oriented to person, place, and time. She appears well-developed and well-nourished.  HENT:  Head: Normocephalic and atraumatic.  Mouth/Throat: Oropharynx is clear and moist.  Eyes: Pupils are equal, round, and reactive to light. Conjunctivae and EOM are normal.  Neck: Normal range of motion.  Cardiovascular: Normal rate, regular rhythm and normal heart sounds.  Pulmonary/Chest: Effort normal and breath sounds normal. She has no decreased breath sounds. She has no wheezes. She has no rales.  Chest wall is mildly tender, reports that it is "sore"; lungs clear, no distress  Abdominal: Soft. Bowel sounds are normal.  Musculoskeletal: Normal range of motion.  Neurological: She is alert and oriented to person, place, and time.  Skin: Skin is warm and dry.  Psychiatric: She has a normal mood and affect.  Nursing note and vitals reviewed.    ED Treatments / Results  Labs (all labs ordered are listed, but only abnormal results are displayed) Labs Reviewed  BASIC METABOLIC PANEL - Abnormal; Notable for the following components:      Result Value   Potassium 3.0 (*)    Creatinine, Ser 1.04 (*)    GFR calc non Af Amer 50 (*)    GFR calc Af Amer 58 (*)    All other components within normal limits  CBC - Abnormal; Notable for the following components:   RBC 3.86 (*)     Hemoglobin 11.0 (*)    HCT 34.9 (*)    All other components within normal limits  I-STAT TROPONIN, ED  I-STAT TROPONIN, ED    EKG EKG Interpretation  Date/Time:  Wednesday January 01 2018 04:10:44 EDT Ventricular Rate:  46 PR Interval:  208 QRS Duration: 94 QT Interval:  442 QTC Calculation: 386 R Axis:   67 Text Interpretation:  Marked sinus bradycardia Left ventricular hypertrophy with repolarization abnormality Abnormal ECG Nonspecific T wave abnormality Confirmed by Thayer Jew [phone]) on 01/01/2018 4:55:23 AM   Radiology Dg Chest 2 View  Result Date: 01/01/2018 CLINICAL DATA:  [age]-year-old female with chest pain and shortness of breath. EXAM: CHEST - 2 VIEW COMPARISON:  Chest radiograph dated 12/08/2017 FINDINGS: Stable cardiomegaly. There is no focal consolidation, pleural effusion, or pneumothorax. No acute osseous pathology. IMPRESSION: 1. No acute cardiopulmonary process. 2. Cardiomegaly. Electronically Signed   By: Anner Crete M.D.   On: 01/01/2018 03:54    Procedures Procedures (including critical care time)  Medications Ordered in ED Medications  potassium chloride SA (K-DUR,KLOR-CON) CR tablet 40 mEq (has no administration in time range)  gi cocktail (Maalox,Lidocaine,Donnatal) (30 mLs Oral Given 01/01/18 0430)     Initial Impression / Assessment and Plan / ED Course  I have reviewed the triage vital signs and the nursing notes.  Pertinent labs & imaging results that were available during my care of the patient were reviewed by me and considered in my medical decision making (see chart for details).  82 year old female here with chest pain.  Reports this began last evening a few hours after dinner.  States worse with lying down, better with sitting up with some associated belching that seemed to improve her symptoms.  She is afebrile and nontoxic.  Chest wall with some mild tenderness but no acute deformity.  Her lungs are clear without any wheezes or  rhonchi.  EKG with some nonspecific T wave changes laterally.  Labs overall reassuring, mild hypokalemia at 3.0. Given PO supplementation  Chest x-ray is clear.  Patient with some known cardiac risk factors, heart score of 4.  She was admitted last month for chest pain rule out with negative evaluation.  She underwent cardiac CT with no calcifications and was found to be very low risk.  Her chest pain was felt to be GI related during that visit.  She does have a known thoracic aneurysm, however denies any back pain, abdominal pain, vitals have been stable.  I do not suspect acute dissection.  Given her age and risk factors, will obtain delta troponin.  I do have suspicion that her pain is GI related given belching that improves her pain.  Delta troponin is 0.00.  Given negative evaluation, low suspicion for ACS, PE, dissection, or other acute cardiac event.  Feel patient can be safely discharged.  Will have her follow-up with her primary care doctor.  Discussed plan with patient, she acknowledged understanding and agreed with plan of care.  Return precautions given for new or worsening symptoms.    Final Clinical Impressions(s) / ED Diagnoses   Final diagnoses:  Chest pain in adult  Hypokalemia    ED Discharge Orders    None       Larene Pickett, PA-C 01/01/18 2395    Merryl Hacker, MD 01/01/18 [phone]

## 2018-01-01 NOTE — Discharge Instructions (Signed)
Your cardiac testing today was normal.  Your potassium was a little low but we gave you supplements for that here. Please follow-up with your primary care doctor. Return here for any new/acute changes.

## 2018-01-01 NOTE — ED Triage Notes (Signed)
Pt arrives to ED from [facility] with complaints of Chest pain with no radiation since last night. EMS reports pt has had chest pain "all night." Pt rated pain 8/10. EMS gave 1 nitro and pain decreased to 7/10. Pt refused additional nitro due to headache. Pt placed in position of comfort with bed locked and lowered, call bell in reach.

## 2018-01-06 ENCOUNTER — Emergency Department (HOSPITAL_COMMUNITY)
Admission: EM | Admit: 2018-01-06 | Discharge: 2018-01-06 | Disposition: A | Discharge status: Home / Self Care | Payer: Medicare Other | Attending: Emergency Medicine | Admitting: Emergency Medicine

## 2018-01-06 ENCOUNTER — Encounter (HOSPITAL_COMMUNITY): Payer: Self-pay

## 2018-01-06 ENCOUNTER — Other Ambulatory Visit: Payer: Self-pay

## 2018-01-06 DIAGNOSIS — R399 Unspecified symptoms and signs involving the genitourinary system: Secondary | ICD-10-CM

## 2018-01-06 DIAGNOSIS — Z79899 Other long term (current) drug therapy: Secondary | ICD-10-CM | POA: Diagnosis not present

## 2018-01-06 DIAGNOSIS — I130 Hypertensive heart and chronic kidney disease with heart failure and stage 1 through stage 4 chronic kidney disease, or unspecified chronic kidney disease: Secondary | ICD-10-CM | POA: Insufficient documentation

## 2018-01-06 DIAGNOSIS — F0390 Unspecified dementia without behavioral disturbance: Secondary | ICD-10-CM | POA: Diagnosis not present

## 2018-01-06 DIAGNOSIS — Z87891 Personal history of nicotine dependence: Secondary | ICD-10-CM | POA: Diagnosis not present

## 2018-01-06 DIAGNOSIS — Z8673 Personal history of transient ischemic attack (TIA), and cerebral infarction without residual deficits: Secondary | ICD-10-CM | POA: Diagnosis not present

## 2018-01-06 DIAGNOSIS — I5032 Chronic diastolic (congestive) heart failure: Secondary | ICD-10-CM | POA: Insufficient documentation

## 2018-01-06 DIAGNOSIS — R51 Headache: Secondary | ICD-10-CM | POA: Insufficient documentation

## 2018-01-06 DIAGNOSIS — N183 Chronic kidney disease, stage 3 (moderate): Secondary | ICD-10-CM | POA: Diagnosis not present

## 2018-01-06 DIAGNOSIS — R300 Dysuria: Secondary | ICD-10-CM | POA: Diagnosis not present

## 2018-01-06 DIAGNOSIS — I10 Essential (primary) hypertension: Secondary | ICD-10-CM

## 2018-01-06 DIAGNOSIS — R519 Headache, unspecified: Secondary | ICD-10-CM

## 2018-01-06 DIAGNOSIS — J45909 Unspecified asthma, uncomplicated: Secondary | ICD-10-CM | POA: Diagnosis not present

## 2018-01-06 LAB — URINALYSIS, ROUTINE W REFLEX MICROSCOPIC
Bilirubin Urine: NEGATIVE
GLUCOSE, UA: NEGATIVE mg/dL
Hgb urine dipstick: NEGATIVE
KETONES UR: NEGATIVE mg/dL
LEUKOCYTES UA: NEGATIVE
Nitrite: NEGATIVE
PH: 6 (ref 5.0–8.0)
Protein, ur: NEGATIVE mg/dL
Specific Gravity, Urine: 1.014 (ref 1.005–1.030)

## 2018-01-06 MED ORDER — HYDRALAZINE HCL 10 MG PO TABS
10.0000 mg | ORAL_TABLET | Freq: Once | ORAL | Status: AC
  Administered 2018-01-06: 10 mg via ORAL
  Filled 2018-01-06: qty 1

## 2018-01-06 MED ORDER — ACETAMINOPHEN 325 MG PO TABS
650.0000 mg | ORAL_TABLET | Freq: Once | ORAL | Status: AC
  Administered 2018-01-06: 650 mg via ORAL
  Filled 2018-01-06: qty 2

## 2018-01-06 MED ORDER — AMLODIPINE BESYLATE 5 MG PO TABS
5.0000 mg | ORAL_TABLET | Freq: Once | ORAL | Status: AC
  Administered 2018-01-06: 5 mg via ORAL
  Filled 2018-01-06: qty 1

## 2018-01-06 NOTE — ED Notes (Signed)
Patient provided with a bottle of water, [place] sandwich, and ginger ale. Will continue to monitor. Patient took PO pills without issue

## 2018-01-06 NOTE — ED Notes (Signed)
Bed: WA07 Expected date:  Expected time:  Means of arrival:  Comments: EMS-headache/dysuria

## 2018-01-06 NOTE — Discharge Instructions (Addendum)
It was our pleasure to provide your ER care today - we hope that you feel better.  Your urine test today looks good or normal - no infection is seen.  Take acetaminophen and/or ibuprofen as need for symptom relief.  Your blood pressure is high today - continue your blood pressure medication, limit salt intake, and follow up with primary care doctor in the next few days for recheck.   Return to ER if worse, new symptoms, fevers, new or severe pain, trouble breathing, other concern.

## 2018-01-06 NOTE — ED Triage Notes (Signed)
Patient BIB EMS from [facility] with complaints of headache, since this morning, and dysuria/frequency/urgency. Patient denies abdominal pain, just the dysuria. Patient denies N/V/Diarrhea, chills, fever. Patient reports the facility has been giving her Azo for her symptoms, but it has not relieved her symptoms.

## 2018-01-06 NOTE — ED Notes (Signed)
Patient transported to [facility] via [facility]

## 2018-01-06 NOTE — ED Provider Notes (Signed)
[facility] DEPT Provider Note   CSN: [id number] Arrival date & time: 01/06/18  1208     History   Chief Complaint Chief Complaint  Patient presents with  . Dysuria  . Headache    HPI Kerri Hernandez is a 82 y.o. female.  Patient c/o urinary urgency, ?mild dysuria this AM. Hx same. Symptoms are mild, persistent, constant, acute onset, without alleviating factors.  Denies abdominal or flank pain. Normal appetite. No nvd. No fever or chills. Also notes gradual onset mild frontal headache today, c/w prior headache. No acute, abrupt or severe head pain. No eye pain or change in vision. No numbness/weakness. No neck pain or stiffness. No syncope. No head injury.   The history is provided by the patient.  Dysuria   Pertinent negatives include no vomiting and no flank pain.  Headache   Pertinent negatives include no fever, no shortness of breath and no vomiting.    Past Medical History:  Diagnosis Date  . Arthritis   . Asthma   . Cancer ([place])   . CHF (congestive heart failure) ([place])   . Hypertension   . TIA (transient ischemic attack)     Patient Active Problem List   Diagnosis Date Noted  . Chest pain at rest   . Pharyngeal foreign body 08/09/2017  . Pressure injury of skin 04/30/2017  . Chest pain 04/29/2017  . Hypertension 04/29/2017  . Aortic valve regurgitation   . Dementia without behavioral disturbance 01/04/2017  . Anemia in stage 3 chronic kidney disease ([place]) 11/30/2016  . Chronic diastolic heart failure ([place]) 11/30/2016  . Environmental and seasonal allergies 11/21/2016  . Urinary incontinence 07/18/2016  . Osteoarthritis of multiple joints 07/15/2015  . HLD (hyperlipidemia) 03/04/2013  . Left ventricular hypertrophy 01/18/2013    Past Surgical History:  Procedure Laterality Date  . ABDOMINAL HYSTERECTOMY    . DIRECT LARYNGOSCOPY N/A 08/09/2017   Procedure: DIRECT  laryngoscopy and rigid esophagoscopy;  Surgeon: Melida Quitter, MD;  Location: [facility];  Service: ENT;  Laterality: N/A;  . MASTECTOMY       OB History   None      Home Medications    Prior to Admission medications   Medication Sig Start Date End Date Taking? Authorizing Provider  acetaminophen (TYLENOL) 500 MG tablet Take 2 tablets (1,000 mg total) by mouth every 6 (six) hours as needed. Patient taking differently: Take 1,000 mg by mouth every 6 (six) hours as needed for mild pain, moderate pain, fever or headache.  07/31/17   Charlesetta Shanks, MD  amLODipine (NORVASC) 10 MG tablet Take 1 tablet (10 mg total) daily by mouth. 05/01/17   Burgess Estelle, MD  ARIPiprazole (ABILIFY) 15 MG tablet Take 15 mg by mouth daily. 11/04/17   [provider]  Ascorbic Acid (VITAMIN C) 1000 MG tablet Take 1,000 mg by mouth daily.    [provider]  cholecalciferol (VITAMIN D) 1000 units tablet Take 1,000 Units by mouth daily.    [provider]  docusate sodium (COLACE) 100 MG capsule Take 1 capsule (100 mg total) by mouth every 12 (twelve) hours. 09/28/17   Tanna Furry, MD  ferrous sulfate 325 (65 FE) MG tablet Take 325 mg by mouth daily.    [provider]  fluticasone (FLONASE) 50 MCG/ACT nasal spray Place 2 sprays into both nostrils daily.     [provider]  furosemide (LASIX) 20 MG tablet Take 20 mg daily by mouth.    [provider]  hydrALAZINE (APRESOLINE) 10 MG tablet Take 2.5 tablets (25 mg total) by mouth 3 (three) times daily. To use as needed if patient's blood pressure remains greater than 150/80 when taking regular medications as prescribed. 11/30/17   Caren Griffins, MD  lidocaine (LIDODERM) 5 % Place 1 patch onto the skin daily. Remove & Discard patch within 12 hours or as directed by MD 12/24/17   Randal Buba, April, MD  loratadine (CLARITIN) 10 MG tablet Take 10 mg by mouth daily.    [provider]  losartan (COZAAR) 100 MG tablet Take 100 mg by mouth daily.    [provider]  nitroGLYCERIN (NITROSTAT) 0.4 MG SL tablet Place 1 tablet (0.4 mg total) under the tongue every 5 (five) minutes as needed for chest pain. For up to 3 consecutive doses *Do not crush* 11/30/17   Caren Griffins, MD  oxybutynin (DITROPAN) 5 MG tablet Take 2.5 mg by mouth 2 (two) times daily.    [provider]  pantoprazole (PROTONIX) 40 MG tablet Take 40 mg by mouth daily. Do not crush    [provider]  phenazopyridine (PYRIDIUM) 200 MG tablet Take 1 tablet (200 mg total) by mouth 3 (three) times daily as needed for pain (urinary discomfort). 12/23/17   Domenic Moras, PA-C  potassium chloride SA (K-DUR,KLOR-CON) 20 MEQ tablet Take 1 tablet (20 mEq total) by mouth 2 (two) times daily. 09/23/16   Delora Fuel, MD  traMADol-acetaminophen (ULTRACET) 37.5-325 MG tablet Take 1 tablet by mouth every 6 (six) hours as needed for moderate pain or severe pain. 11/30/17   Caren Griffins, MD  venlafaxine XR (EFFEXOR-XR) 150 MG 24 hr capsule Take 150 mg by mouth daily with breakfast.    [provider]  VENTOLIN HFA 108 (90 Base) MCG/ACT inhaler Take 2 puffs by mouth every 4 (four) hours as needed for wheezing.  11/15/17   [provider]  vitamin E 1000 UNIT capsule Take 1,000 Units by mouth daily.    [provider]    Family History Family History  Problem Relation Age of Onset  . Heart attack Father     Social History Social History   Tobacco Use  . Smoking status: Former Research scientist (life sciences)  . Smokeless tobacco: Never Used  . Tobacco comment: As a teenager.  Substance Use Topics  . Alcohol use: No    Comment: Occasional wine in the past  . Drug use: No     Allergies   Influenza vaccines; Paliperidone; Aspirin; Penicillins; and Sulfa antibiotics   Review of Systems Review of Systems  Constitutional: Negative for fever.  HENT: Negative for sore throat.   Eyes: Negative for pain and visual disturbance.  Respiratory: Negative for shortness of breath.     Cardiovascular: Negative for chest pain.  Gastrointestinal: Negative for abdominal pain, diarrhea and vomiting.  Genitourinary: Positive for dysuria. Negative for flank pain.  Musculoskeletal: Negative for back pain, neck pain and neck stiffness.  Skin: Negative for rash.  Neurological: Positive for headaches. Negative for speech difficulty, weakness and numbness.  Hematological: Does not bruise/bleed easily.  Psychiatric/Behavioral: Negative for confusion.     Physical Exam Updated Vital Signs BP (!) 191/91 (BP Location: Left Arm)   Pulse (!) 52   Temp 98.6 F (37 C) (Oral)   Resp 17   Ht 1.524 m (5')   Wt 54.4 kg (120 lb)   SpO2 100%   BMI 23.44 kg/m   Physical Exam  Constitutional: She is  oriented to person, place, and time. She appears well-developed and well-nourished. No distress.  HENT:  Head: Atraumatic.  Nose: Nose normal.  Mouth/Throat: Oropharynx is clear and moist.  No sinus or temporal tenderness.  Eyes: Pupils are equal, round, and reactive to light. Conjunctivae and EOM are normal. No scleral icterus.  Neck: Neck supple. No tracheal deviation present. No thyromegaly present.  No stiffness or rigidity.   Cardiovascular: Normal rate, regular rhythm, normal heart sounds and intact distal pulses. Exam reveals no gallop and no friction rub.  No murmur heard. Pulmonary/Chest: Effort normal and breath sounds normal. No respiratory distress.  Abdominal: Soft. Normal appearance and bowel sounds are normal. She exhibits no distension. There is no tenderness.  Genitourinary:  Genitourinary Comments: No cva tenderness.  Musculoskeletal: Normal range of motion. She exhibits no edema or tenderness.  Neurological: She is alert and oriented to person, place, and time. No cranial nerve deficit.  Motor intact bilaterally. Steady gait.   Skin: Skin is warm and dry. No rash noted. She is not diaphoretic.  Psychiatric: She has a normal mood and affect.  Nursing note and  vitals reviewed.    ED Treatments / Results  Labs (all labs ordered are listed, but only abnormal results are displayed) Results for orders placed or performed during the hospital encounter of 01/06/18  Urinalysis, Routine w reflex microscopic- may I&O cath if menses  Result Value Ref Range   Color, Urine YELLOW YELLOW   APPearance CLEAR CLEAR   Specific Gravity, Urine 1.014 1.005 - 1.030   pH 6.0 5.0 - 8.0   Glucose, UA NEGATIVE NEGATIVE mg/dL   Hgb urine dipstick NEGATIVE NEGATIVE   Bilirubin Urine NEGATIVE NEGATIVE   Ketones, ur NEGATIVE NEGATIVE mg/dL   Protein, ur NEGATIVE NEGATIVE mg/dL   Nitrite NEGATIVE NEGATIVE   Leukocytes, UA NEGATIVE NEGATIVE     EKG None  Radiology No results found.  Procedures Procedures (including critical care time)  Medications Ordered in ED Medications  amLODipine (NORVASC) tablet 5 mg (has no administration in time range)  hydrALAZINE (APRESOLINE) tablet 10 mg (has no administration in time range)  acetaminophen (TYLENOL) tablet 650 mg (has no administration in time range)     Initial Impression / Assessment and Plan / ED Course  I have reviewed the triage vital signs and the nursing notes.  Pertinent labs & imaging results that were available during my care of the patient were reviewed by me and considered in my medical decision making (see chart for details).  Labs sent.   Acetaminophen po. Po fluids.  bp is high. Pt given a po dose of her bp meds. Amlodipine po, hydralazine po.   Tolerating po fluids. abd soft nt. Await ua.   Reviewed nursing notes and prior charts for additional history.   Pt with recent prior ct imaging to evaluate same symptoms - cts then neg for acute process. Do not feel repeat imaging today would benefit patient.   Labs reviewed - no uti.   Pt content, comfortable, alert, tolerating po.    Pt currently appears stable for d/c.     Final Clinical Impressions(s) / ED Diagnoses   Final  diagnoses:  None    ED Discharge Orders    None       Lajean Saver, MD 01/06/18 1423

## 2018-01-07 ENCOUNTER — Encounter (HOSPITAL_COMMUNITY): Payer: Self-pay

## 2018-01-07 ENCOUNTER — Emergency Department (HOSPITAL_COMMUNITY)
Admission: EM | Admit: 2018-01-07 | Discharge: 2018-01-08 | Disposition: A | Discharge status: Home / Self Care | Payer: Medicare Other | Attending: Emergency Medicine | Admitting: Emergency Medicine

## 2018-01-07 DIAGNOSIS — Z87891 Personal history of nicotine dependence: Secondary | ICD-10-CM | POA: Insufficient documentation

## 2018-01-07 DIAGNOSIS — R079 Chest pain, unspecified: Secondary | ICD-10-CM | POA: Diagnosis not present

## 2018-01-07 DIAGNOSIS — I130 Hypertensive heart and chronic kidney disease with heart failure and stage 1 through stage 4 chronic kidney disease, or unspecified chronic kidney disease: Secondary | ICD-10-CM | POA: Diagnosis not present

## 2018-01-07 DIAGNOSIS — Z79899 Other long term (current) drug therapy: Secondary | ICD-10-CM | POA: Diagnosis not present

## 2018-01-07 DIAGNOSIS — N183 Chronic kidney disease, stage 3 (moderate): Secondary | ICD-10-CM | POA: Diagnosis not present

## 2018-01-07 DIAGNOSIS — J45909 Unspecified asthma, uncomplicated: Secondary | ICD-10-CM | POA: Insufficient documentation

## 2018-01-07 DIAGNOSIS — F0390 Unspecified dementia without behavioral disturbance: Secondary | ICD-10-CM | POA: Insufficient documentation

## 2018-01-07 DIAGNOSIS — I5032 Chronic diastolic (congestive) heart failure: Secondary | ICD-10-CM | POA: Diagnosis not present

## 2018-01-07 DIAGNOSIS — R05 Cough: Secondary | ICD-10-CM | POA: Diagnosis not present

## 2018-01-07 DIAGNOSIS — Z853 Personal history of malignant neoplasm of breast: Secondary | ICD-10-CM | POA: Diagnosis not present

## 2018-01-07 NOTE — ED Triage Notes (Signed)
Pt comes via [facility] from [facility] for CP that started about an hour ago, central, no radiating, no n/v, also having a headache. PTA received one nitro with some relief

## 2018-01-08 ENCOUNTER — Emergency Department (HOSPITAL_COMMUNITY): Payer: Medicare Other

## 2018-01-08 LAB — CBC
HEMATOCRIT: 36.4 % (ref 36.0–46.0)
HEMOGLOBIN: 11 g/dL — AB (ref 12.0–15.0)
MCH: 28.3 pg (ref 26.0–34.0)
MCHC: 30.2 g/dL (ref 30.0–36.0)
MCV: 93.6 fL (ref 78.0–100.0)
Platelets: 190 10*3/uL (ref 150–400)
RBC: 3.89 MIL/uL (ref 3.87–5.11)
RDW: 14.6 % (ref 11.5–15.5)
WBC: 6.4 10*3/uL (ref 4.0–10.5)

## 2018-01-08 LAB — BASIC METABOLIC PANEL
ANION GAP: 9 (ref 5–15)
BUN: 16 mg/dL (ref 8–23)
CHLORIDE: 107 mmol/L (ref 98–111)
CO2: 24 mmol/L (ref 22–32)
Calcium: 8.8 mg/dL — ABNORMAL LOW (ref 8.9–10.3)
Creatinine, Ser: 0.99 mg/dL (ref 0.44–1.00)
GFR calc Af Amer: >60 mL/min (ref >60)
GFR, EST NON AFRICAN AMERICAN: 53 mL/min — AB (ref >60)
Glucose, Bld: 88 mg/dL (ref 70–99)
POTASSIUM: 5.2 mmol/L — AB (ref 3.5–5.1)
SODIUM: 140 mmol/L (ref 135–145)

## 2018-01-08 LAB — I-STAT TROPONIN, ED
TROPONIN I, POC: 0.02 ng/mL (ref 0.00–0.08)
Troponin i, poc: 0.07 ng/mL (ref 0.00–0.08)

## 2018-01-08 NOTE — ED Notes (Signed)
Santiago Glad at [facility] accepts report

## 2018-01-08 NOTE — ED Provider Notes (Signed)
[facility] EMERGENCY DEPARTMENT Provider Note   CSN: [id number] Arrival date & time: 01/07/18  2348     History   Chief Complaint Chief Complaint  Patient presents with  . Chest Pain    HPI Leauna Sharber is a 82 y.o. female.  The history is provided by the patient and medical records.  Chest Pain      82 year old female with history of arthritis, asthma, prior breast cancer status post mastectomy, anemia, congestive heart failure, hypertension, prior TIA, presenting to the ED with chest pain.  When I asked patient why she was here in the ED, she stated "my nurse was rude to me, I asked her for help and she told me I hope you go to hell soon and die".  Patient had to be redirected several times as to why she is here in the emergency department.  States she has diffuse chest pain, worse with palpation.  She does report a slight cough with small amount of mucous production.  Denies fever, chills, sweats, shortness of breath, palpitations, dizziness, weakness, or feelings of syncope.    Past Medical History:  Diagnosis Date  . Arthritis   . Asthma   . Cancer ([place])   . CHF (congestive heart failure) ([place])   . Hypertension   . TIA (transient ischemic attack)     Patient Active Problem List   Diagnosis Date Noted  . Chest pain at rest   . Pharyngeal foreign body 08/09/2017  . Pressure injury of skin 04/30/2017  . Chest pain 04/29/2017  . Hypertension 04/29/2017  . Aortic valve regurgitation   . Dementia without behavioral disturbance 01/04/2017  . Anemia in stage 3 chronic kidney disease ([place]) 11/30/2016  . Chronic diastolic heart failure ([place]) 11/30/2016  . Environmental and seasonal allergies 11/21/2016  . Urinary incontinence 07/18/2016  . Osteoarthritis of multiple joints 07/15/2015  . HLD (hyperlipidemia) 03/04/2013  . Left ventricular hypertrophy 01/18/2013    Past Surgical History:  Procedure Laterality Date  . ABDOMINAL HYSTERECTOMY    .  DIRECT LARYNGOSCOPY N/A 08/09/2017   Procedure: DIRECT  laryngoscopy and rigid esophagoscopy;  Surgeon: Melida Quitter, MD;  Location: [facility];  Service: ENT;  Laterality: N/A;  . MASTECTOMY       OB History   None      Home Medications    Prior to Admission medications   Medication Sig Start Date End Date Taking? Authorizing Provider  acetaminophen (TYLENOL) 500 MG tablet Take 2 tablets (1,000 mg total) by mouth every 6 (six) hours as needed. Patient taking differently: Take 1,000 mg by mouth every 6 (six) hours as needed for mild pain, moderate pain, fever or headache.  07/31/17   Charlesetta Shanks, MD  amLODipine (NORVASC) 10 MG tablet Take 1 tablet (10 mg total) daily by mouth. 05/01/17   Burgess Estelle, MD  ARIPiprazole (ABILIFY) 15 MG tablet Take 15 mg by mouth daily. 11/04/17   [provider]  Ascorbic Acid (VITAMIN C) 1000 MG tablet Take 1,000 mg by mouth daily.    [provider]  cholecalciferol (VITAMIN D) 1000 units tablet Take 1,000 Units by mouth daily.    [provider]  docusate sodium (COLACE) 100 MG capsule Take 1 capsule (100 mg total) by mouth every 12 (twelve) hours. 09/28/17   Tanna Furry, MD  ferrous sulfate 325 (65 FE) MG tablet Take 325 mg by mouth daily.    [provider]  fluticasone (FLONASE) 50 MCG/ACT nasal spray Place 2  sprays into both nostrils daily.     [provider]  furosemide (LASIX) 20 MG tablet Take 20 mg daily by mouth.    [provider]  hydrALAZINE (APRESOLINE) 10 MG tablet Take 2.5 tablets (25 mg total) by mouth 3 (three) times daily. To use as needed if patient's blood pressure remains greater than 150/80 when taking regular medications as prescribed. 11/30/17   Caren Griffins, MD  lidocaine (LIDODERM) 5 % Place 1 patch onto the skin daily. Remove & Discard patch within 12 hours or as directed by MD 12/24/17   Randal Buba, April, MD  loratadine (CLARITIN) 10 MG tablet Take 10 mg by mouth daily.     [provider]  losartan (COZAAR) 100 MG tablet Take 100 mg by mouth daily.    [provider]  nitroGLYCERIN (NITROSTAT) 0.4 MG SL tablet Place 1 tablet (0.4 mg total) under the tongue every 5 (five) minutes as needed for chest pain. For up to 3 consecutive doses *Do not crush* 11/30/17   Caren Griffins, MD  oxybutynin (DITROPAN) 5 MG tablet Take 2.5 mg by mouth 2 (two) times daily.    [provider]  pantoprazole (PROTONIX) 40 MG tablet Take 40 mg by mouth daily. Do not crush    [provider]  phenazopyridine (PYRIDIUM) 200 MG tablet Take 1 tablet (200 mg total) by mouth 3 (three) times daily as needed for pain (urinary discomfort). 12/23/17   Domenic Moras, PA-C  potassium chloride SA (K-DUR,KLOR-CON) 20 MEQ tablet Take 1 tablet (20 mEq total) by mouth 2 (two) times daily. 3/0/16   Delora Fuel, MD  traMADol-acetaminophen (ULTRACET) 37.5-325 MG tablet Take 1 tablet by mouth every 6 (six) hours as needed for moderate pain or severe pain. 11/30/17   Caren Griffins, MD  venlafaxine XR (EFFEXOR-XR) 150 MG 24 hr capsule Take 150 mg by mouth daily with breakfast.    [provider]  VENTOLIN HFA 108 (90 Base) MCG/ACT inhaler Take 2 puffs by mouth every 4 (four) hours as needed for wheezing.  11/15/17   [provider]  vitamin E 1000 UNIT capsule Take 1,000 Units by mouth daily.    [provider]    Family History Family History  Problem Relation Age of Onset  . Heart attack Father     Social History Social History   Tobacco Use  . Smoking status: Former Research scientist (life sciences)  . Smokeless tobacco: Never Used  . Tobacco comment: As a teenager.  Substance Use Topics  . Alcohol use: No    Comment: Occasional wine in the past  . Drug use: No     Allergies   Influenza vaccines; Paliperidone; Aspirin; Penicillins; and Sulfa antibiotics   Review of Systems Review of Systems  Cardiovascular: Positive for chest pain.  All other systems  reviewed and are negative.    Physical Exam Updated Vital Signs BP (!) 145/80 (BP Location: Left Arm)   Pulse (!) 52   Temp 98 F (36.7 C) (Oral)   Resp 15   SpO2 100%   Physical Exam  Constitutional: She is oriented to person, place, and time. She appears well-developed and well-nourished.  HENT:  Head: Normocephalic and atraumatic.  Mouth/Throat: Oropharynx is clear and moist.  Eyes: Pupils are equal, round, and reactive to light. Conjunctivae and EOM are normal.  Neck: Normal range of motion.  Cardiovascular: Normal rate, regular rhythm and normal heart sounds.  Pulmonary/Chest: Effort normal and breath sounds normal. She has no decreased  breath sounds. She has no wheezes. She has no rales.  Abdominal: Soft. Bowel sounds are normal. There is no tenderness. There is no rigidity and no guarding.  Musculoskeletal: Normal range of motion.  Neurological: She is alert and oriented to person, place, and time.  Skin: Skin is warm and dry.  Psychiatric: She has a normal mood and affect.  Nursing note and vitals reviewed.    ED Treatments / Results  Labs (all labs ordered are listed, but only abnormal results are displayed) Labs Reviewed  BASIC METABOLIC PANEL - Abnormal; Notable for the following components:      Result Value   Potassium 5.2 (*)    Calcium 8.8 (*)    GFR calc non Af Amer 53 (*)    All other components within normal limits  CBC - Abnormal; Notable for the following components:   Hemoglobin 11.0 (*)    All other components within normal limits  I-STAT TROPONIN, ED  I-STAT TROPONIN, ED    EKG EKG Interpretation  Date/Time:  Wednesday January 08 2018 00:01:56 EDT Ventricular Rate:  51 PR Interval:    QRS Duration: 91 QT Interval:  422 QTC Calculation: 389 R Axis:   29 Text Interpretation:  Sinus rhythm Atrial premature complexes Consider left ventricular hypertrophy Nonspecific T abnormalities, lateral leads No significant change since last tracing  Confirmed by Ripley Fraise [phone]) on 01/08/2018 12:20:04 AM   Radiology Dg Chest 2 View  Result Date: 01/08/2018 CLINICAL DATA:  Chest pain and shortness of breath tonight EXAM: CHEST - 2 VIEW COMPARISON:  01/01/2018 FINDINGS: Cardiac enlargement. No vascular congestion or edema. Lungs appear clear and expanded. No blunting of costophrenic angles. No pneumothorax. Degenerative changes in the spine and shoulders. Skin fold projected over the left chest. IMPRESSION: Cardiac enlargement. No evidence of active pulmonary disease. Electronically Signed   By: Lucienne Capers M.D.   On: 01/08/2018 00:52    Procedures Procedures (including critical care time)  Medications Ordered in ED Medications - No data to display   Initial Impression / Assessment and Plan / ED Course  I have reviewed the triage vital signs and the nursing notes.  Pertinent labs & imaging results that were available during my care of the patient were reviewed by me and considered in my medical decision making (see chart for details).  82 year old female here with reported chest pain.  Initially, patient mostly complaining about the nurse at her facility.  She had to be redirected several times to actually focus on why she is here in the ED today.  Reports chest pain with cough.  Chest pain is reproducible on exam.  EKG is nonischemic.  Labs overall reassuring.  Chest x-ray is clear.  Patient has had multiple prior evaluations in the ED as well as prior admission last month for chest pain with negative work-up.  She did undergo cardiac CT that was low risk with no visible calcifications.  Patient's age and risk factors put her in a heart score of 4, although I suspect her pain is likely MSK.  Will obtain delta troponin.  Delta troponin negative.  Patient remained stable here.  Low suspicion for ACS, PE, dissection, acute cardiac event.  Feel she is stable to follow-up with her primary care doctor.  She will return here for any  new or worsening symptoms.  Final Clinical Impressions(s) / ED Diagnoses   Final diagnoses:  Chest pain, unspecified type    ED Discharge Orders    None  Larene Pickett, PA-C 01/08/18 0177    Ripley Fraise, MD 01/08/18 [id number]

## 2018-01-08 NOTE — ED Provider Notes (Signed)
Patient seen/examined in the Emergency Department in conjunction with Midlevel Provider [place] Patient reports chest pain, feeling improved Exam : Awake alert, no distress watching television.  No loud murmurs on cardiac exam.  Lungs are clear Plan: Imaging/labs pending   Ripley Fraise, MD 01/08/18 [id number]

## 2018-01-08 NOTE — Discharge Instructions (Addendum)
Cardiac testing today was normal. Please follow-up with your primary care doctor. Return here for any new/acute changes.

## 2018-01-27 DIAGNOSIS — M25521 Pain in right elbow: Secondary | ICD-10-CM | POA: Diagnosis not present

## 2018-01-27 DIAGNOSIS — R32 Unspecified urinary incontinence: Secondary | ICD-10-CM | POA: Diagnosis not present

## 2018-01-27 DIAGNOSIS — I10 Essential (primary) hypertension: Secondary | ICD-10-CM | POA: Diagnosis not present

## 2018-01-27 DIAGNOSIS — R600 Localized edema: Secondary | ICD-10-CM | POA: Diagnosis not present

## 2018-01-27 DIAGNOSIS — Z9189 Other specified personal risk factors, not elsewhere classified: Secondary | ICD-10-CM | POA: Diagnosis not present

## 2018-01-27 DIAGNOSIS — F419 Anxiety disorder, unspecified: Secondary | ICD-10-CM | POA: Diagnosis not present

## 2018-01-27 DIAGNOSIS — M25561 Pain in right knee: Secondary | ICD-10-CM | POA: Diagnosis not present

## 2018-01-27 DIAGNOSIS — J45909 Unspecified asthma, uncomplicated: Secondary | ICD-10-CM | POA: Diagnosis not present

## 2018-01-27 DIAGNOSIS — D649 Anemia, unspecified: Secondary | ICD-10-CM | POA: Diagnosis not present

## 2018-01-27 DIAGNOSIS — J309 Allergic rhinitis, unspecified: Secondary | ICD-10-CM | POA: Diagnosis not present

## 2018-01-27 DIAGNOSIS — K219 Gastro-esophageal reflux disease without esophagitis: Secondary | ICD-10-CM | POA: Diagnosis not present

## 2018-01-27 DIAGNOSIS — E876 Hypokalemia: Secondary | ICD-10-CM | POA: Diagnosis not present

## 2018-02-13 DIAGNOSIS — F319 Bipolar disorder, unspecified: Secondary | ICD-10-CM | POA: Diagnosis not present

## 2018-02-13 DIAGNOSIS — F0390 Unspecified dementia without behavioral disturbance: Secondary | ICD-10-CM | POA: Diagnosis not present

## 2018-02-13 DIAGNOSIS — F259 Schizoaffective disorder, unspecified: Secondary | ICD-10-CM | POA: Diagnosis not present

## 2018-02-14 DIAGNOSIS — K219 Gastro-esophageal reflux disease without esophagitis: Secondary | ICD-10-CM | POA: Diagnosis not present

## 2018-02-14 DIAGNOSIS — N189 Chronic kidney disease, unspecified: Secondary | ICD-10-CM | POA: Diagnosis not present

## 2018-02-14 DIAGNOSIS — E876 Hypokalemia: Secondary | ICD-10-CM | POA: Diagnosis not present

## 2018-02-14 DIAGNOSIS — F250 Schizoaffective disorder, bipolar type: Secondary | ICD-10-CM | POA: Diagnosis not present

## 2018-02-14 DIAGNOSIS — K5909 Other constipation: Secondary | ICD-10-CM | POA: Diagnosis not present

## 2018-02-14 DIAGNOSIS — I2510 Atherosclerotic heart disease of native coronary artery without angina pectoris: Secondary | ICD-10-CM | POA: Diagnosis not present

## 2018-02-14 DIAGNOSIS — I10 Essential (primary) hypertension: Secondary | ICD-10-CM | POA: Diagnosis not present

## 2018-02-14 DIAGNOSIS — F0391 Unspecified dementia with behavioral disturbance: Secondary | ICD-10-CM | POA: Diagnosis not present

## 2018-02-14 DIAGNOSIS — Z8673 Personal history of transient ischemic attack (TIA), and cerebral infarction without residual deficits: Secondary | ICD-10-CM | POA: Diagnosis not present

## 2018-02-14 DIAGNOSIS — G8929 Other chronic pain: Secondary | ICD-10-CM | POA: Diagnosis not present

## 2018-02-14 DIAGNOSIS — J45909 Unspecified asthma, uncomplicated: Secondary | ICD-10-CM | POA: Diagnosis not present

## 2018-02-14 DIAGNOSIS — E785 Hyperlipidemia, unspecified: Secondary | ICD-10-CM | POA: Diagnosis not present

## 2018-02-15 DIAGNOSIS — F250 Schizoaffective disorder, bipolar type: Secondary | ICD-10-CM | POA: Diagnosis not present

## 2018-02-16 DIAGNOSIS — F250 Schizoaffective disorder, bipolar type: Secondary | ICD-10-CM | POA: Diagnosis not present

## 2018-02-17 DIAGNOSIS — F250 Schizoaffective disorder, bipolar type: Secondary | ICD-10-CM | POA: Diagnosis not present

## 2018-02-18 DIAGNOSIS — I10 Essential (primary) hypertension: Secondary | ICD-10-CM | POA: Diagnosis not present

## 2018-02-18 DIAGNOSIS — F250 Schizoaffective disorder, bipolar type: Secondary | ICD-10-CM | POA: Diagnosis not present

## 2018-02-19 DIAGNOSIS — F250 Schizoaffective disorder, bipolar type: Secondary | ICD-10-CM | POA: Diagnosis not present

## 2018-02-20 DIAGNOSIS — F250 Schizoaffective disorder, bipolar type: Secondary | ICD-10-CM | POA: Diagnosis not present

## 2018-03-05 DIAGNOSIS — F0390 Unspecified dementia without behavioral disturbance: Secondary | ICD-10-CM | POA: Diagnosis not present

## 2018-03-05 DIAGNOSIS — Z741 Need for assistance with personal care: Secondary | ICD-10-CM | POA: Diagnosis not present

## 2018-03-05 DIAGNOSIS — J4530 Mild persistent asthma, uncomplicated: Secondary | ICD-10-CM | POA: Diagnosis not present

## 2018-03-05 DIAGNOSIS — I209 Angina pectoris, unspecified: Secondary | ICD-10-CM | POA: Diagnosis not present

## 2018-03-05 DIAGNOSIS — M1991 Primary osteoarthritis, unspecified site: Secondary | ICD-10-CM | POA: Diagnosis not present

## 2018-03-05 DIAGNOSIS — F319 Bipolar disorder, unspecified: Secondary | ICD-10-CM | POA: Diagnosis not present

## 2018-03-17 DIAGNOSIS — J45909 Unspecified asthma, uncomplicated: Secondary | ICD-10-CM | POA: Diagnosis not present

## 2018-03-17 DIAGNOSIS — D649 Anemia, unspecified: Secondary | ICD-10-CM | POA: Diagnosis not present

## 2018-03-17 DIAGNOSIS — Z23 Encounter for immunization: Secondary | ICD-10-CM | POA: Diagnosis not present

## 2018-03-17 DIAGNOSIS — F419 Anxiety disorder, unspecified: Secondary | ICD-10-CM | POA: Diagnosis not present

## 2018-03-17 DIAGNOSIS — I10 Essential (primary) hypertension: Secondary | ICD-10-CM | POA: Diagnosis not present

## 2018-03-17 DIAGNOSIS — K219 Gastro-esophageal reflux disease without esophagitis: Secondary | ICD-10-CM | POA: Diagnosis not present

## 2018-03-17 DIAGNOSIS — Z139 Encounter for screening, unspecified: Secondary | ICD-10-CM | POA: Diagnosis not present

## 2018-03-17 DIAGNOSIS — R32 Unspecified urinary incontinence: Secondary | ICD-10-CM | POA: Diagnosis not present

## 2018-03-17 DIAGNOSIS — R7989 Other specified abnormal findings of blood chemistry: Secondary | ICD-10-CM | POA: Diagnosis not present

## 2018-03-17 DIAGNOSIS — R600 Localized edema: Secondary | ICD-10-CM | POA: Diagnosis not present

## 2018-03-17 DIAGNOSIS — J309 Allergic rhinitis, unspecified: Secondary | ICD-10-CM | POA: Diagnosis not present

## 2018-03-17 DIAGNOSIS — M25561 Pain in right knee: Secondary | ICD-10-CM | POA: Diagnosis not present

## 2018-03-17 DIAGNOSIS — E876 Hypokalemia: Secondary | ICD-10-CM | POA: Diagnosis not present

## 2018-03-18 DIAGNOSIS — M79676 Pain in unspecified toe(s): Secondary | ICD-10-CM | POA: Diagnosis not present

## 2018-03-18 DIAGNOSIS — G309 Alzheimer's disease, unspecified: Secondary | ICD-10-CM | POA: Diagnosis not present

## 2018-03-18 DIAGNOSIS — L03032 Cellulitis of left toe: Secondary | ICD-10-CM | POA: Diagnosis not present

## 2018-03-18 DIAGNOSIS — M79675 Pain in left toe(s): Secondary | ICD-10-CM | POA: Diagnosis not present

## 2018-03-18 DIAGNOSIS — B351 Tinea unguium: Secondary | ICD-10-CM | POA: Diagnosis not present

## 2018-03-18 DIAGNOSIS — M79674 Pain in right toe(s): Secondary | ICD-10-CM | POA: Diagnosis not present

## 2018-03-18 DIAGNOSIS — L600 Ingrowing nail: Secondary | ICD-10-CM | POA: Diagnosis not present

## 2018-03-18 DIAGNOSIS — L03031 Cellulitis of right toe: Secondary | ICD-10-CM | POA: Diagnosis not present

## 2018-03-18 DIAGNOSIS — M2140 Flat foot [pes planus] (acquired), unspecified foot: Secondary | ICD-10-CM | POA: Diagnosis not present

## 2018-03-18 DIAGNOSIS — M79673 Pain in unspecified foot: Secondary | ICD-10-CM | POA: Diagnosis not present

## 2018-03-18 DIAGNOSIS — I739 Peripheral vascular disease, unspecified: Secondary | ICD-10-CM | POA: Diagnosis not present

## 2018-03-18 DIAGNOSIS — M19079 Primary osteoarthritis, unspecified ankle and foot: Secondary | ICD-10-CM | POA: Diagnosis not present

## 2018-04-13 DIAGNOSIS — R102 Pelvic and perineal pain: Secondary | ICD-10-CM | POA: Diagnosis not present

## 2018-04-13 DIAGNOSIS — M170 Bilateral primary osteoarthritis of knee: Secondary | ICD-10-CM | POA: Diagnosis not present

## 2018-04-13 DIAGNOSIS — Z87891 Personal history of nicotine dependence: Secondary | ICD-10-CM | POA: Diagnosis not present

## 2018-04-13 DIAGNOSIS — Z8041 Family history of malignant neoplasm of ovary: Secondary | ICD-10-CM | POA: Diagnosis not present

## 2018-04-13 DIAGNOSIS — R4689 Other symptoms and signs involving appearance and behavior: Secondary | ICD-10-CM | POA: Diagnosis not present

## 2018-04-13 DIAGNOSIS — Z7982 Long term (current) use of aspirin: Secondary | ICD-10-CM | POA: Diagnosis not present

## 2018-04-13 DIAGNOSIS — N183 Chronic kidney disease, stage 3 (moderate): Secondary | ICD-10-CM | POA: Diagnosis not present

## 2018-04-13 DIAGNOSIS — Z9011 Acquired absence of right breast and nipple: Secondary | ICD-10-CM | POA: Diagnosis not present

## 2018-04-13 DIAGNOSIS — Z880 Allergy status to penicillin: Secondary | ICD-10-CM | POA: Diagnosis not present

## 2018-04-13 DIAGNOSIS — R58 Hemorrhage, not elsewhere classified: Secondary | ICD-10-CM | POA: Diagnosis not present

## 2018-04-13 DIAGNOSIS — F0390 Unspecified dementia without behavioral disturbance: Secondary | ICD-10-CM | POA: Diagnosis not present

## 2018-04-13 DIAGNOSIS — E876 Hypokalemia: Secondary | ICD-10-CM | POA: Diagnosis not present

## 2018-04-13 DIAGNOSIS — R9389 Abnormal findings on diagnostic imaging of other specified body structures: Secondary | ICD-10-CM | POA: Diagnosis not present

## 2018-04-13 DIAGNOSIS — Z853 Personal history of malignant neoplasm of breast: Secondary | ICD-10-CM | POA: Diagnosis not present

## 2018-04-13 DIAGNOSIS — I5032 Chronic diastolic (congestive) heart failure: Secondary | ICD-10-CM | POA: Diagnosis not present

## 2018-04-13 DIAGNOSIS — J45909 Unspecified asthma, uncomplicated: Secondary | ICD-10-CM | POA: Diagnosis not present

## 2018-04-13 DIAGNOSIS — I130 Hypertensive heart and chronic kidney disease with heart failure and stage 1 through stage 4 chronic kidney disease, or unspecified chronic kidney disease: Secondary | ICD-10-CM | POA: Diagnosis not present

## 2018-04-13 DIAGNOSIS — F250 Schizoaffective disorder, bipolar type: Secondary | ICD-10-CM | POA: Diagnosis not present

## 2018-04-13 DIAGNOSIS — D649 Anemia, unspecified: Secondary | ICD-10-CM | POA: Diagnosis not present

## 2018-04-13 DIAGNOSIS — E1122 Type 2 diabetes mellitus with diabetic chronic kidney disease: Secondary | ICD-10-CM | POA: Diagnosis not present

## 2018-04-13 DIAGNOSIS — N898 Other specified noninflammatory disorders of vagina: Secondary | ICD-10-CM | POA: Diagnosis not present

## 2018-05-09 IMAGING — CR DG SHOULDER 2+V*R*
2 series · 2 of 2 positions shown · non-contrast
Comparison: None.

CLINICAL DATA: Pt is c/o right shoulder pain onset last night.
Denies fall, states h/o arthritis. Unable to perform axillary view,
pt unable to abduct right extremity away from body.

EXAM:
RIGHT SHOULDER - 2+ VIEW

[x shoulder ap right (1 of 2)]
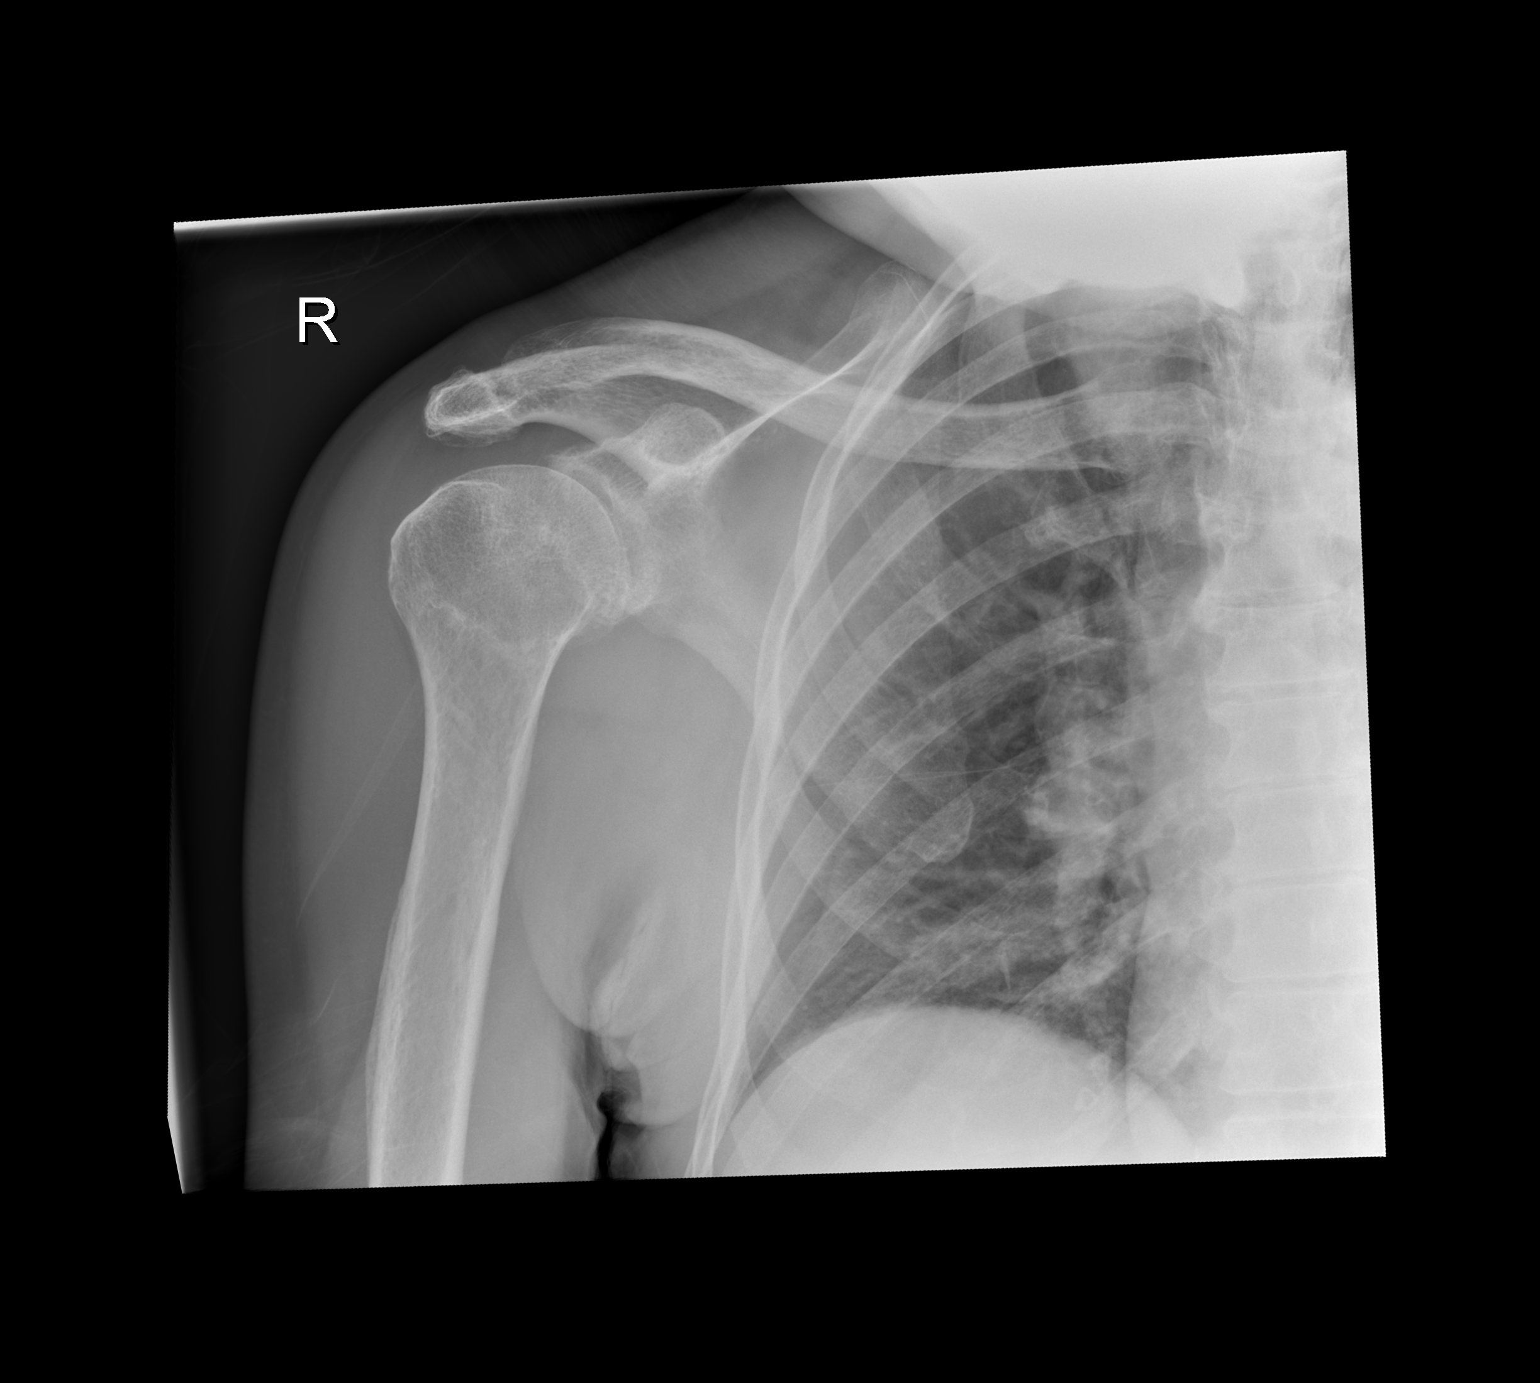

[x shoulder ap right (2 of 2)]
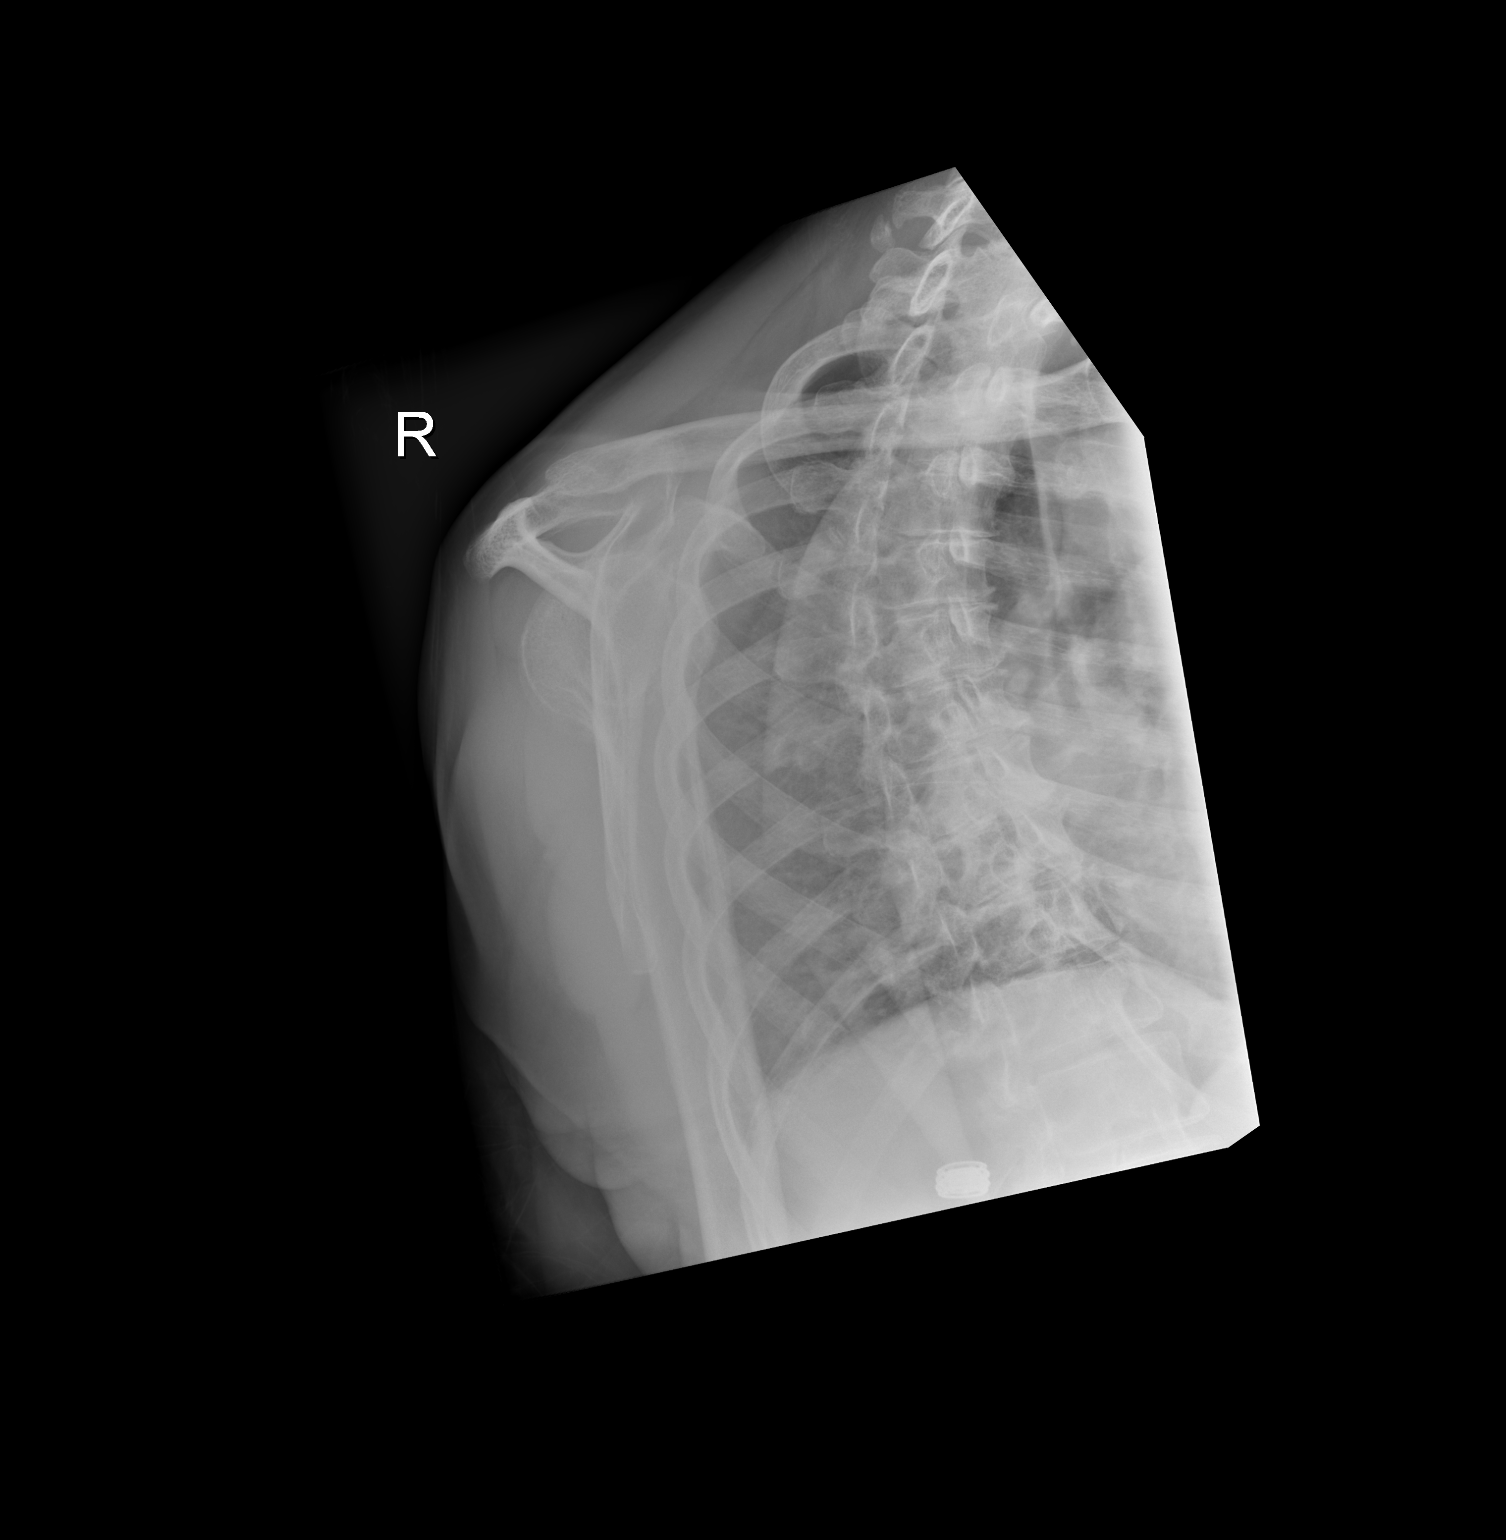

[2 of 2 positions shown; findings below may reference images not displayed]

FINDINGS: Moderate glenohumeral and mild acromioclavicular joint degenerative
change. No acute fracture or dislocation. Visualized portion of the
right hemithorax is normal.
IMPRESSION: Degenerative change, without acute osseous finding.

## 2018-08-25 IMAGING — CR DG CHEST 2V
2 series · 2 of 2 positions shown · non-contrast
Comparison: Chest radiograph dated 12/08/2017

CLINICAL DATA: Seven 11-year-old female with chest pain and
shortness of breath.

EXAM:
CHEST - 2 VIEW

[chest lat]
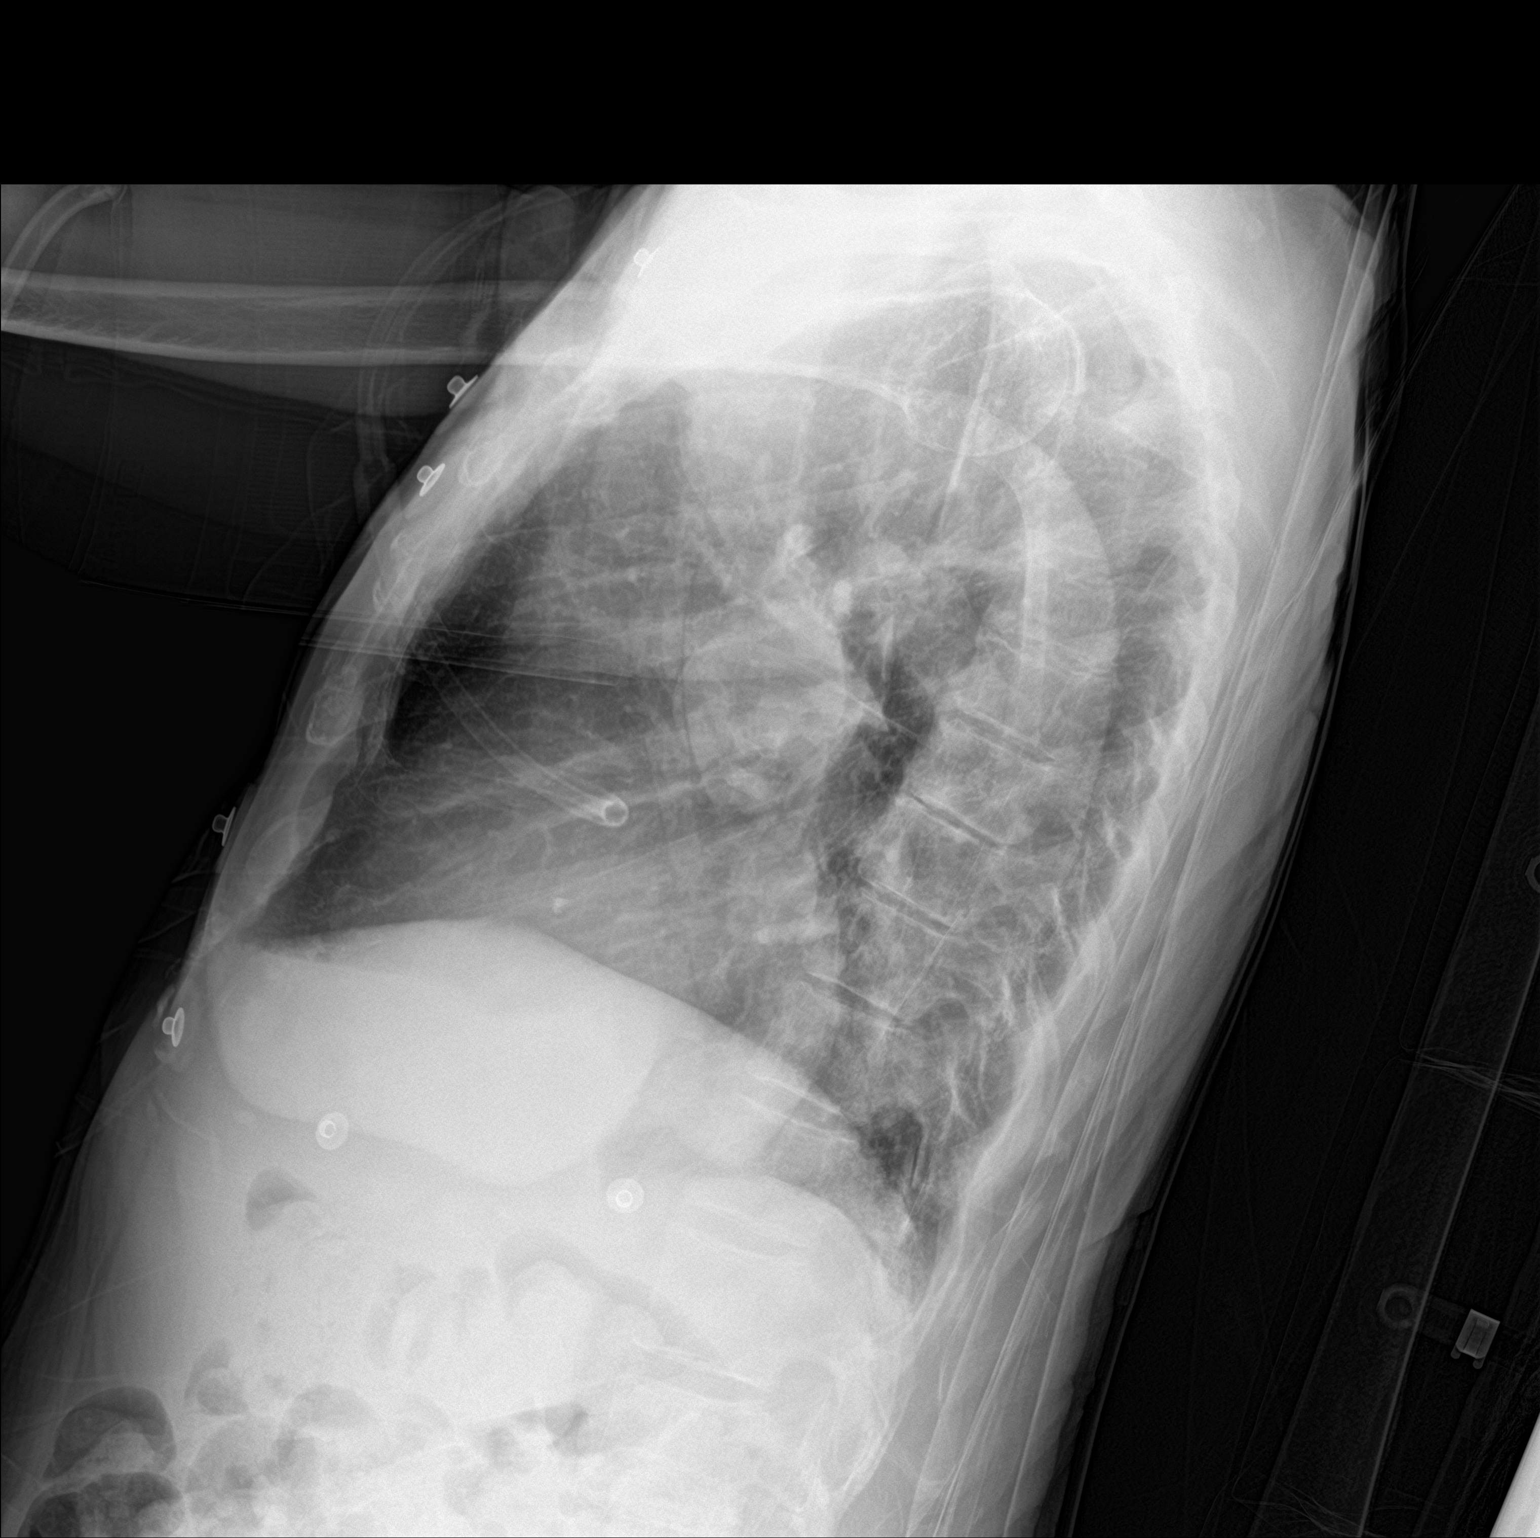

[chest ap]
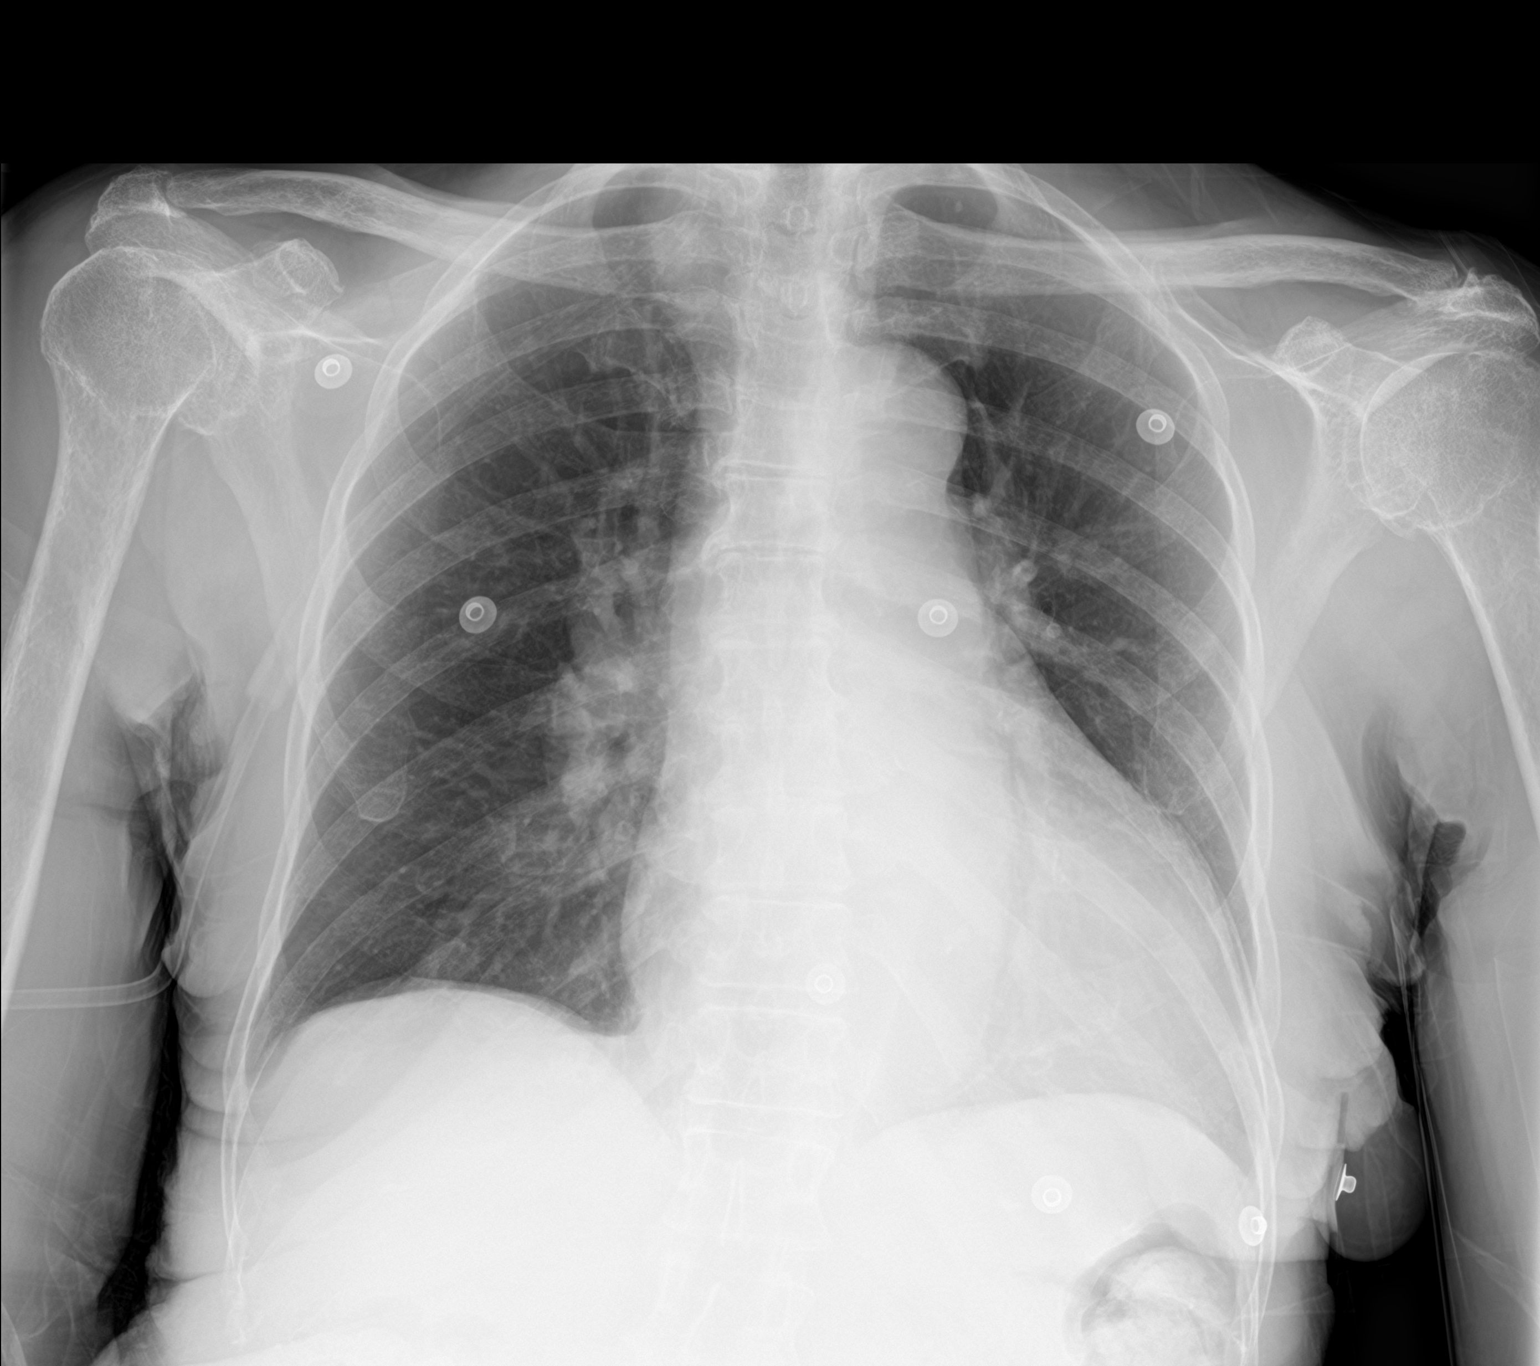

[2 of 2 positions shown; findings below may reference images not displayed]

FINDINGS: Stable cardiomegaly. There is no focal consolidation, pleural
effusion, or pneumothorax. No acute osseous pathology.
IMPRESSION: 1. No acute cardiopulmonary process.
2. Cardiomegaly.

## 2018-09-01 IMAGING — DX DG CHEST 2V
2 series · 2 of 2 positions shown · non-contrast
Comparison: 01/01/2018

CLINICAL DATA: Chest pain and shortness of breath tonight

EXAM:
CHEST - 2 VIEW

[chest lat]
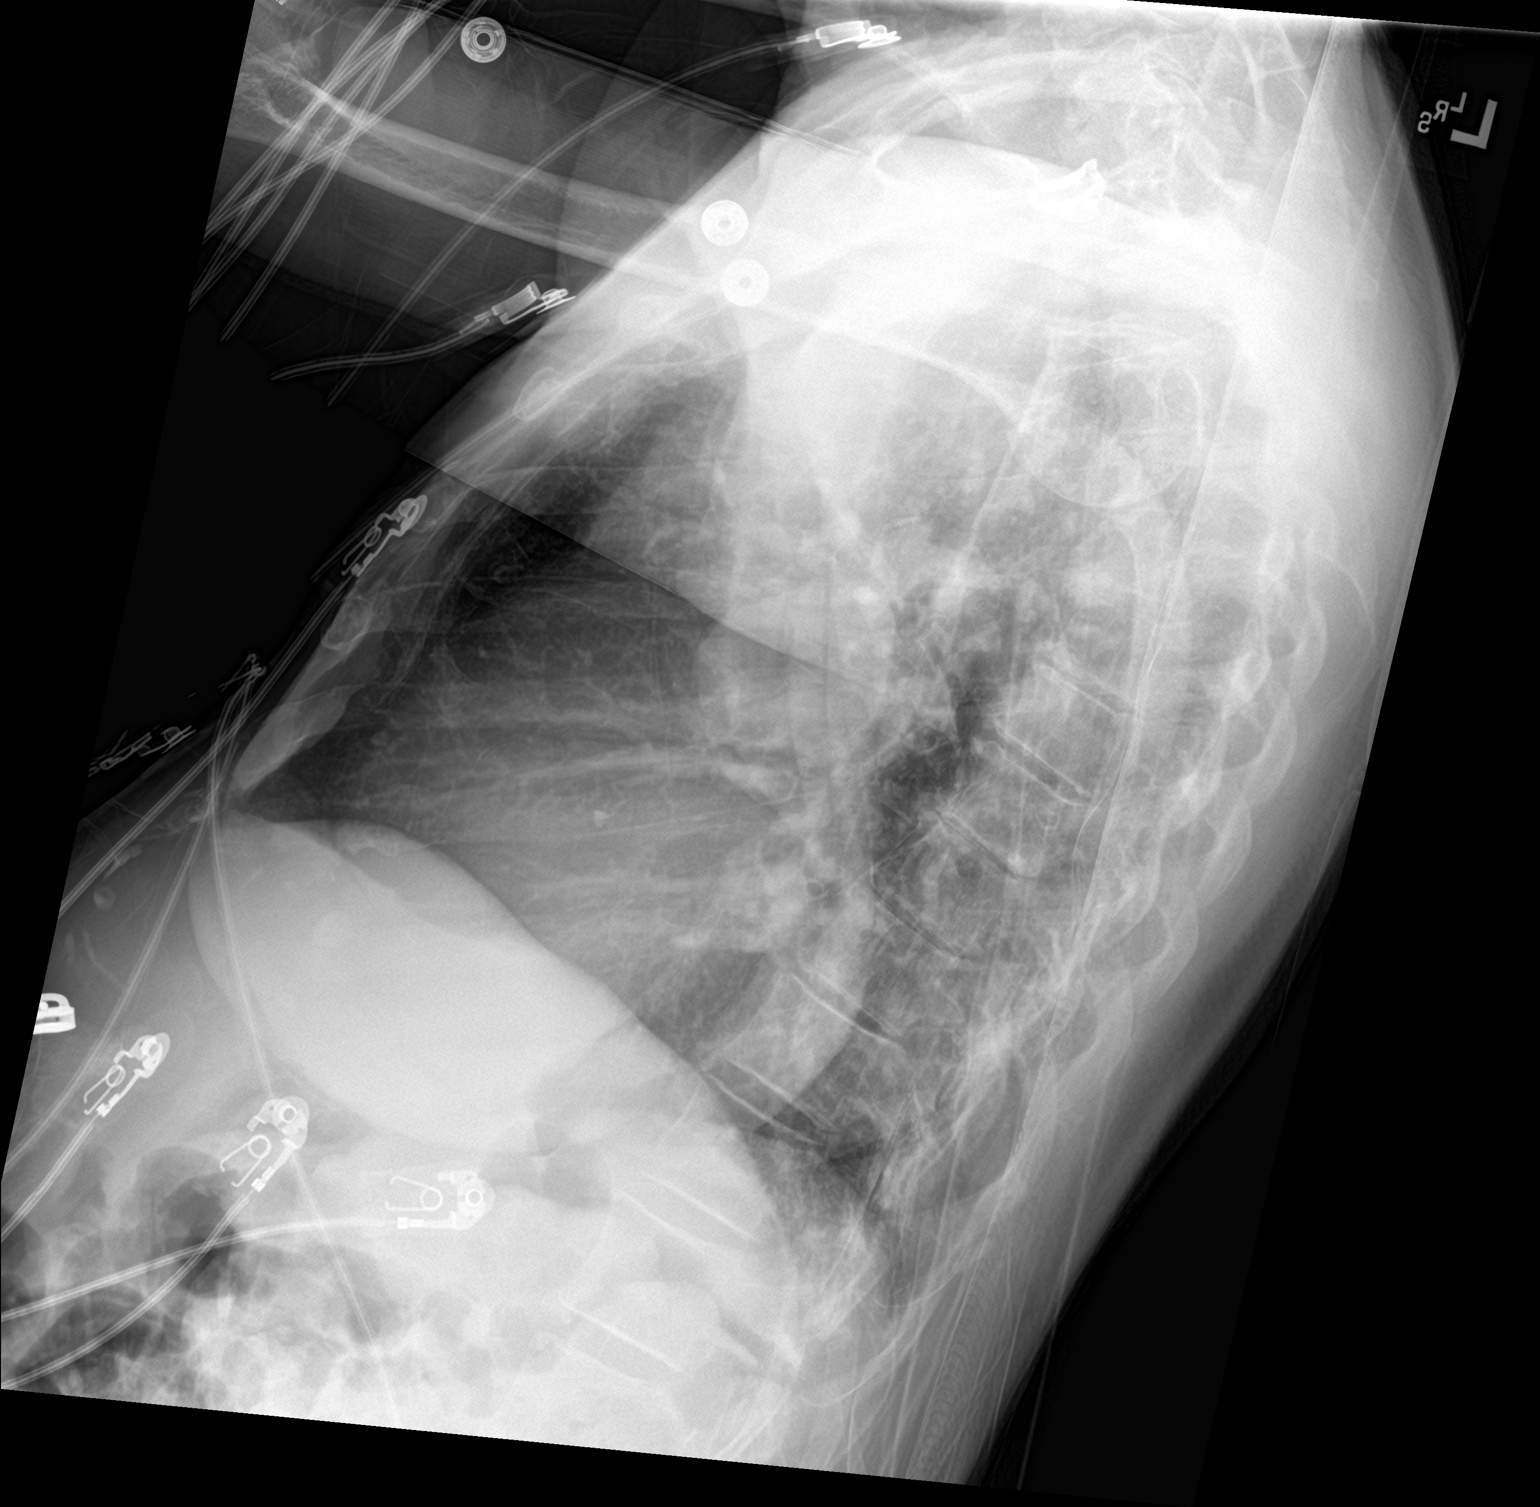

[chest ap]
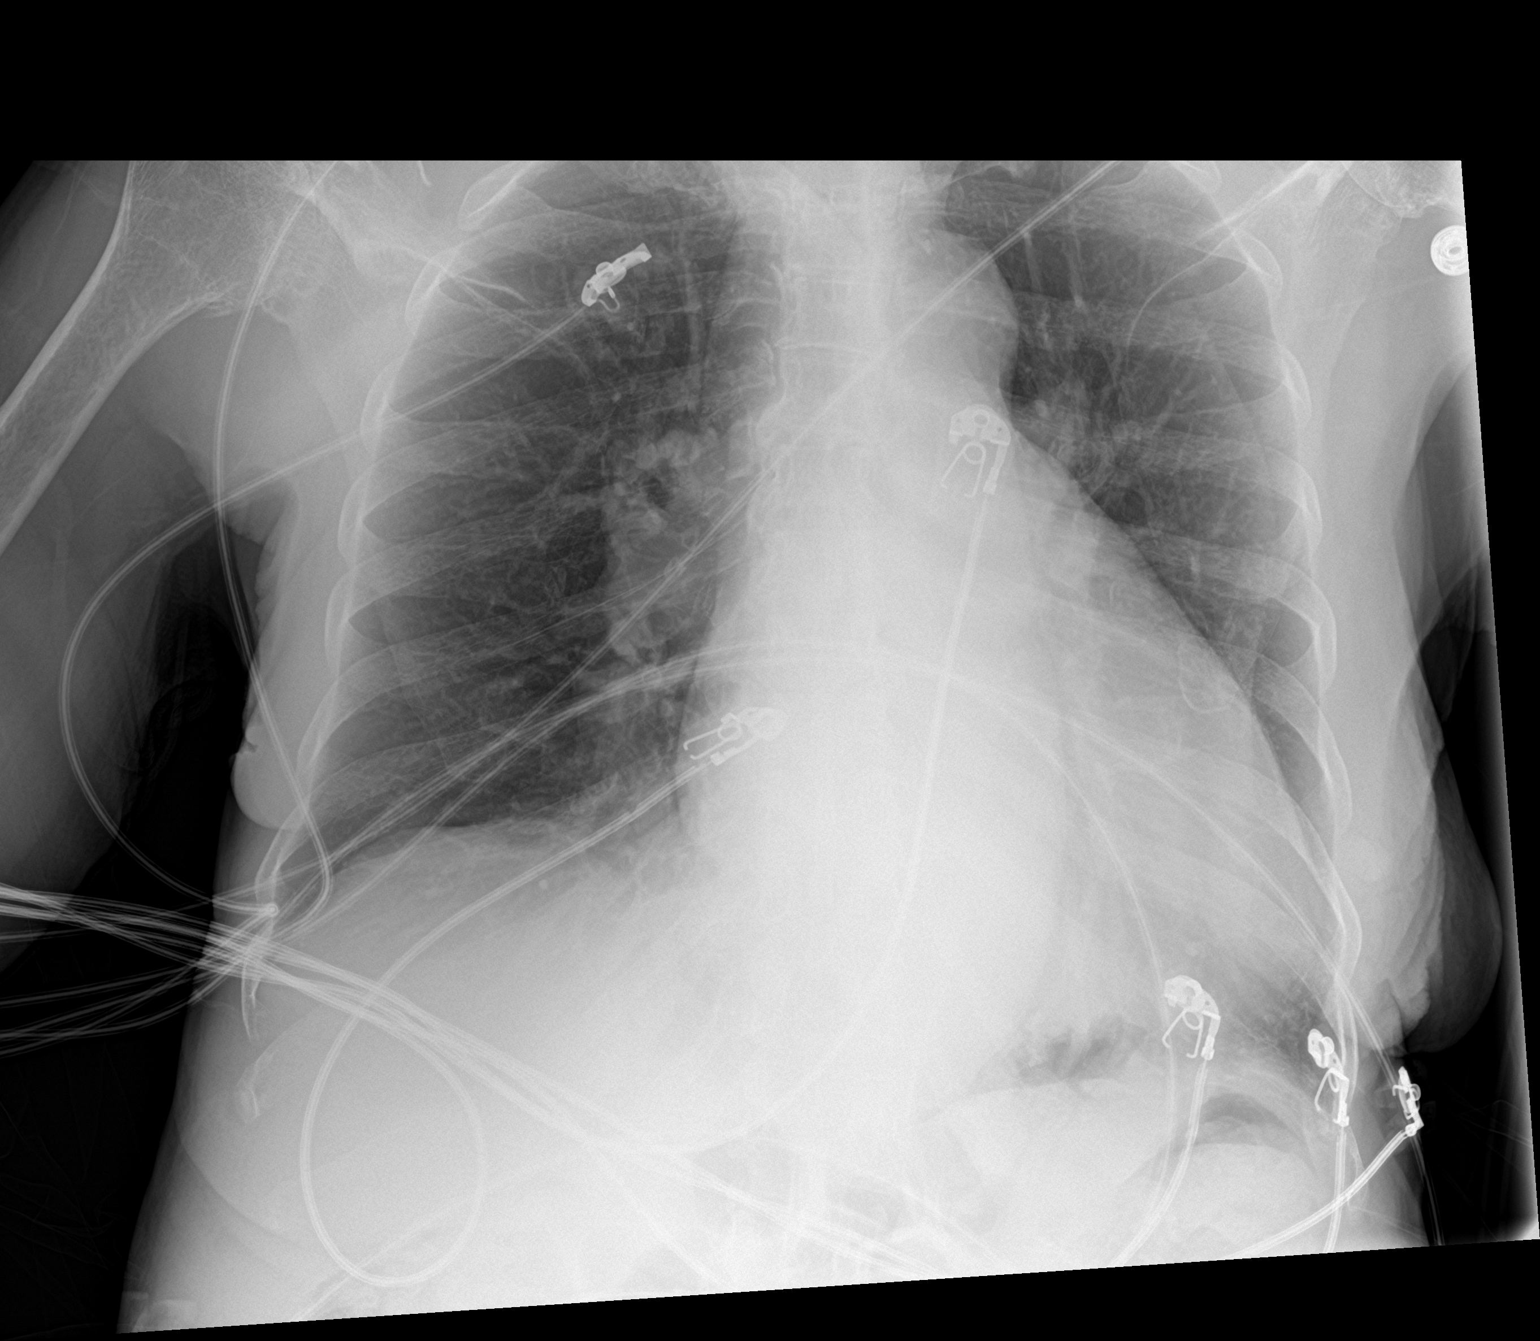

[2 of 2 positions shown; findings below may reference images not displayed]

FINDINGS: Cardiac enlargement. No vascular congestion or edema. Lungs appear
clear and expanded. No blunting of costophrenic angles. No
pneumothorax. Degenerative changes in the spine and shoulders. Skin
fold projected over the left chest.
IMPRESSION: Cardiac enlargement. No evidence of active pulmonary disease.
